# Patient Record
Sex: Female | Born: 1935
Health system: Southern US, Community
[De-identification: ages and names within clinical notes are randomized; demographics above are authoritative.]

## PROBLEM LIST (undated history)

## (undated) DIAGNOSIS — L900 Lichen sclerosus et atrophicus: Secondary | ICD-10-CM

## (undated) DIAGNOSIS — M419 Scoliosis, unspecified: Secondary | ICD-10-CM

## (undated) DIAGNOSIS — D126 Benign neoplasm of colon, unspecified: Secondary | ICD-10-CM

## (undated) DIAGNOSIS — E782 Mixed hyperlipidemia: Secondary | ICD-10-CM

## (undated) DIAGNOSIS — I10 Essential (primary) hypertension: Secondary | ICD-10-CM

## (undated) DIAGNOSIS — R413 Other amnesia: Secondary | ICD-10-CM

## (undated) DIAGNOSIS — R439 Unspecified disturbances of smell and taste: Secondary | ICD-10-CM

## (undated) DIAGNOSIS — F068 Other specified mental disorders due to known physiological condition: Secondary | ICD-10-CM

## (undated) DIAGNOSIS — K219 Gastro-esophageal reflux disease without esophagitis: Secondary | ICD-10-CM

## (undated) DIAGNOSIS — M1990 Unspecified osteoarthritis, unspecified site: Secondary | ICD-10-CM

## (undated) DIAGNOSIS — M8580 Other specified disorders of bone density and structure, unspecified site: Secondary | ICD-10-CM

## (undated) DIAGNOSIS — D170 Benign lipomatous neoplasm of skin and subcutaneous tissue of head, face and neck: Secondary | ICD-10-CM

## (undated) HISTORY — DX: Other specified mental disorders due to known physiological condition: F06.8

## (undated) HISTORY — DX: Essential (primary) hypertension: I10

## (undated) HISTORY — DX: Unspecified disturbances of smell and taste: R43.9

## (undated) HISTORY — DX: Other specified disorders of bone density and structure, unspecified site: M85.80

## (undated) HISTORY — DX: Other amnesia: R41.3

## (undated) HISTORY — DX: Lichen sclerosus et atrophicus: L90.0

## (undated) HISTORY — PX: TONSILECTOMY, ADENOIDECTOMY, BILATERAL MYRINGOTOMY AND TUBES: SHX2538

## (undated) HISTORY — DX: Benign lipomatous neoplasm of skin and subcutaneous tissue of head, face and neck: D17.0

## (undated) HISTORY — DX: Mixed hyperlipidemia: E78.2

## (undated) HISTORY — PX: CATARACT EXTRACTION, BILATERAL: SHX1313

## (undated) HISTORY — DX: Benign neoplasm of colon, unspecified: D12.6

---

## 1986-04-25 HISTORY — PX: OOPHORECTOMY: SHX86

## 1998-03-26 ENCOUNTER — Other Ambulatory Visit: Admission: RE | Admit: 1998-03-26 | Discharge: 1998-03-26 | Payer: Self-pay | Admitting: Obstetrics and Gynecology

## 1998-10-01 ENCOUNTER — Ambulatory Visit (HOSPITAL_COMMUNITY): Admission: RE | Admit: 1998-10-01 | Discharge: 1998-10-01 | Payer: Self-pay | Admitting: Obstetrics & Gynecology

## 1999-02-11 ENCOUNTER — Encounter: Payer: Self-pay | Admitting: Rheumatology

## 1999-02-11 ENCOUNTER — Encounter: Admission: RE | Admit: 1999-02-11 | Discharge: 1999-02-11 | Payer: Self-pay | Admitting: Rheumatology

## 1999-03-30 ENCOUNTER — Other Ambulatory Visit: Admission: RE | Admit: 1999-03-30 | Discharge: 1999-03-30 | Payer: Self-pay | Admitting: Obstetrics and Gynecology

## 2000-09-26 ENCOUNTER — Other Ambulatory Visit: Admission: RE | Admit: 2000-09-26 | Discharge: 2000-09-26 | Payer: Self-pay | Admitting: Obstetrics and Gynecology

## 2001-09-12 ENCOUNTER — Encounter (INDEPENDENT_AMBULATORY_CARE_PROVIDER_SITE_OTHER): Payer: Self-pay | Admitting: Specialist

## 2001-09-12 ENCOUNTER — Ambulatory Visit (HOSPITAL_COMMUNITY): Admission: RE | Admit: 2001-09-12 | Discharge: 2001-09-12 | Payer: Self-pay | Admitting: Gastroenterology

## 2002-03-04 ENCOUNTER — Other Ambulatory Visit: Admission: RE | Admit: 2002-03-04 | Discharge: 2002-03-04 | Payer: Self-pay | Admitting: Obstetrics and Gynecology

## 2002-07-23 ENCOUNTER — Ambulatory Visit (HOSPITAL_COMMUNITY): Admission: RE | Admit: 2002-07-23 | Discharge: 2002-07-23 | Payer: Self-pay | Admitting: *Deleted

## 2002-07-23 ENCOUNTER — Encounter: Payer: Self-pay | Admitting: *Deleted

## 2003-09-18 ENCOUNTER — Encounter: Admission: RE | Admit: 2003-09-18 | Discharge: 2003-12-17 | Payer: Self-pay | Admitting: Neurology

## 2003-09-19 ENCOUNTER — Ambulatory Visit (HOSPITAL_COMMUNITY): Admission: RE | Admit: 2003-09-19 | Discharge: 2003-09-19 | Payer: Self-pay | Admitting: Neurology

## 2003-10-11 ENCOUNTER — Ambulatory Visit (HOSPITAL_COMMUNITY): Admission: RE | Admit: 2003-10-11 | Discharge: 2003-10-11 | Payer: Self-pay | Admitting: Neurology

## 2004-02-09 ENCOUNTER — Encounter (INDEPENDENT_AMBULATORY_CARE_PROVIDER_SITE_OTHER): Payer: Self-pay | Admitting: Specialist

## 2004-02-09 ENCOUNTER — Ambulatory Visit (HOSPITAL_COMMUNITY): Admission: RE | Admit: 2004-02-09 | Discharge: 2004-02-09 | Payer: Self-pay | Admitting: Gastroenterology

## 2004-03-29 ENCOUNTER — Ambulatory Visit (HOSPITAL_COMMUNITY): Admission: RE | Admit: 2004-03-29 | Discharge: 2004-03-29 | Payer: Self-pay | Admitting: Internal Medicine

## 2004-04-06 ENCOUNTER — Other Ambulatory Visit: Admission: RE | Admit: 2004-04-06 | Discharge: 2004-04-06 | Payer: Self-pay | Admitting: Obstetrics and Gynecology

## 2006-03-31 ENCOUNTER — Other Ambulatory Visit: Admission: RE | Admit: 2006-03-31 | Discharge: 2006-03-31 | Payer: Self-pay | Admitting: Obstetrics and Gynecology

## 2007-02-12 ENCOUNTER — Ambulatory Visit (HOSPITAL_COMMUNITY): Admission: RE | Admit: 2007-02-12 | Discharge: 2007-02-12 | Payer: Self-pay | Admitting: Dentistry

## 2007-02-26 ENCOUNTER — Ambulatory Visit: Payer: Self-pay | Admitting: Cardiology

## 2007-02-26 LAB — CONVERTED CEMR LAB
Albumin: 4 g/dL (ref 3.5–5.2)
Alkaline Phosphatase: 30 units/L — ABNORMAL LOW (ref 39–117)
Cholesterol: 188 mg/dL (ref 0–200)
GFR calc Af Amer: 157 mL/min
GFR calc non Af Amer: 130 mL/min
Total Bilirubin: 0.8 mg/dL (ref 0.3–1.2)
Total CHOL/HDL Ratio: 3.1
Triglycerides: 99 mg/dL (ref 0–149)
VLDL: 20 mg/dL (ref 0–40)

## 2007-03-06 ENCOUNTER — Ambulatory Visit: Payer: Self-pay

## 2007-04-12 ENCOUNTER — Encounter: Admission: RE | Admit: 2007-04-12 | Discharge: 2007-04-12 | Payer: Self-pay | Admitting: Orthopedic Surgery

## 2007-10-16 ENCOUNTER — Ambulatory Visit: Payer: Self-pay | Admitting: Cardiology

## 2007-10-16 LAB — CONVERTED CEMR LAB
ALT: 26 units/L (ref 0–35)
BUN: 26 mg/dL — ABNORMAL HIGH (ref 6–23)
Bilirubin, Direct: 0.1 mg/dL (ref 0.0–0.3)
CO2: 28 meq/L (ref 19–32)
Chloride: 107 meq/L (ref 96–112)
GFR calc Af Amer: 106 mL/min
GFR calc non Af Amer: 88 mL/min
Glucose, Bld: 110 mg/dL — ABNORMAL HIGH (ref 70–99)
LDL Cholesterol: 93 mg/dL (ref 0–99)
Potassium: 3.6 meq/L (ref 3.5–5.1)
Sodium: 142 meq/L (ref 135–145)
Total Bilirubin: 0.8 mg/dL (ref 0.3–1.2)

## 2008-01-25 ENCOUNTER — Ambulatory Visit: Payer: Self-pay | Admitting: Obstetrics and Gynecology

## 2008-04-09 ENCOUNTER — Ambulatory Visit: Payer: Self-pay | Admitting: Obstetrics and Gynecology

## 2008-04-09 ENCOUNTER — Other Ambulatory Visit: Admission: RE | Admit: 2008-04-09 | Discharge: 2008-04-09 | Payer: Self-pay | Admitting: Obstetrics and Gynecology

## 2008-04-09 ENCOUNTER — Encounter: Payer: Self-pay | Admitting: Obstetrics and Gynecology

## 2008-05-22 ENCOUNTER — Ambulatory Visit: Payer: Self-pay | Admitting: Obstetrics and Gynecology

## 2008-07-19 DIAGNOSIS — R0989 Other specified symptoms and signs involving the circulatory and respiratory systems: Secondary | ICD-10-CM

## 2008-07-19 DIAGNOSIS — E785 Hyperlipidemia, unspecified: Secondary | ICD-10-CM | POA: Insufficient documentation

## 2008-07-19 DIAGNOSIS — I10 Essential (primary) hypertension: Secondary | ICD-10-CM | POA: Insufficient documentation

## 2008-09-25 ENCOUNTER — Ambulatory Visit: Payer: Self-pay | Admitting: Cardiology

## 2008-09-26 LAB — CONVERTED CEMR LAB
ALT: 25 units/L (ref 0–35)
Albumin: 4 g/dL (ref 3.5–5.2)
BUN: 22 mg/dL (ref 6–23)
Calcium: 9.5 mg/dL (ref 8.4–10.5)
Chloride: 103 meq/L (ref 96–112)
Cholesterol: 210 mg/dL — ABNORMAL HIGH (ref 0–200)
GFR calc non Af Amer: 74.9 mL/min (ref >60)
Glucose, Bld: 90 mg/dL (ref 70–99)
Potassium: 3.9 meq/L (ref 3.5–5.1)
Total Protein: 6.4 g/dL (ref 6.0–8.3)

## 2008-11-10 ENCOUNTER — Telehealth: Payer: Self-pay | Admitting: Cardiology

## 2009-01-27 ENCOUNTER — Telehealth: Payer: Self-pay | Admitting: Cardiology

## 2009-03-19 ENCOUNTER — Encounter: Payer: Self-pay | Admitting: Cardiology

## 2009-03-24 ENCOUNTER — Ambulatory Visit: Payer: Self-pay | Admitting: Obstetrics and Gynecology

## 2009-03-31 ENCOUNTER — Telehealth (INDEPENDENT_AMBULATORY_CARE_PROVIDER_SITE_OTHER): Payer: Self-pay | Admitting: *Deleted

## 2009-05-13 ENCOUNTER — Ambulatory Visit: Payer: Self-pay | Admitting: Obstetrics and Gynecology

## 2009-05-15 ENCOUNTER — Telehealth: Payer: Self-pay | Admitting: Cardiology

## 2009-05-19 ENCOUNTER — Ambulatory Visit: Payer: Self-pay | Admitting: Cardiology

## 2009-05-22 LAB — CONVERTED CEMR LAB
Albumin: 3.9 g/dL (ref 3.5–5.2)
Alkaline Phosphatase: 35 units/L — ABNORMAL LOW (ref 39–117)
Direct LDL: 231.5 mg/dL
HDL: 73.4 mg/dL (ref >39.00)
Total CHOL/HDL Ratio: 4
Triglycerides: 129 mg/dL (ref 0.0–149.0)
VLDL: 25.8 mg/dL (ref 0.0–40.0)

## 2009-06-23 ENCOUNTER — Ambulatory Visit: Payer: Self-pay | Admitting: Cardiology

## 2009-06-24 ENCOUNTER — Encounter (INDEPENDENT_AMBULATORY_CARE_PROVIDER_SITE_OTHER): Payer: Self-pay | Admitting: *Deleted

## 2009-06-24 LAB — CONVERTED CEMR LAB
Cholesterol: 181 mg/dL (ref 0–200)
HDL: 81.5 mg/dL (ref >39.00)
Total Bilirubin: 0.2 mg/dL — ABNORMAL LOW (ref 0.3–1.2)
Total CHOL/HDL Ratio: 2
VLDL: 14.6 mg/dL (ref 0.0–40.0)

## 2009-06-25 ENCOUNTER — Encounter: Admission: RE | Admit: 2009-06-25 | Discharge: 2009-06-25 | Payer: Self-pay | Admitting: Orthopedic Surgery

## 2009-07-13 ENCOUNTER — Telehealth: Payer: Self-pay | Admitting: Cardiology

## 2009-09-17 ENCOUNTER — Ambulatory Visit: Payer: Self-pay | Admitting: Cardiology

## 2009-09-18 LAB — CONVERTED CEMR LAB
Calcium: 9.7 mg/dL (ref 8.4–10.5)
Creatinine, Ser: 0.6 mg/dL (ref 0.4–1.2)
Glucose, Bld: 74 mg/dL (ref 70–99)

## 2009-11-02 ENCOUNTER — Telehealth: Payer: Self-pay | Admitting: Cardiology

## 2010-03-09 ENCOUNTER — Telehealth: Payer: Self-pay | Admitting: Cardiology

## 2010-03-16 ENCOUNTER — Telehealth: Payer: Self-pay | Admitting: Cardiology

## 2010-03-24 ENCOUNTER — Telehealth: Payer: Self-pay | Admitting: Cardiology

## 2010-03-29 ENCOUNTER — Telehealth: Payer: Self-pay | Admitting: Cardiology

## 2010-04-14 ENCOUNTER — Telehealth: Payer: Self-pay | Admitting: Cardiology

## 2010-04-28 ENCOUNTER — Ambulatory Visit: Admit: 2010-04-28 | Payer: Self-pay | Admitting: Cardiology

## 2010-05-17 ENCOUNTER — Other Ambulatory Visit
Admission: RE | Admit: 2010-05-17 | Discharge: 2010-05-17 | Payer: Self-pay | Source: Home / Self Care | Admitting: Obstetrics and Gynecology

## 2010-05-17 ENCOUNTER — Ambulatory Visit
Admission: RE | Admit: 2010-05-17 | Discharge: 2010-05-17 | Payer: Self-pay | Source: Home / Self Care | Attending: Gynecology | Admitting: Gynecology

## 2010-05-17 ENCOUNTER — Other Ambulatory Visit: Payer: Self-pay | Admitting: Obstetrics and Gynecology

## 2010-05-25 NOTE — Progress Notes (Signed)
Summary: t having a grinding sound coming from jaw since starting new med  Phone Note Call from Patient   Caller: Patient [phone] Reason for Call: Talk to Nurse Summary of Call: pt was switched to a new med due to side effects, only took med a few days and has a grinding sound coming from her jaw-pls advise-(also wanted it noted that she was given a 30 day supply and now has all this med if she's to stop taking, although she's says no one had any idea this would happen) Initial call taken by: Glynda Jaeger,  March 24, 2010 12:45 PM  Follow-up for Phone Call        spoke with pt, she reports a grinding sound on the right side of jaw when she chews. this has happened since she started the pravachol. she will stop the pravachol and let me know if her symptoms improve Deliah Goody, RN  March 24, 2010 5:47 PM

## 2010-05-25 NOTE — Letter (Signed)
Summary: Custom - Lipid  [facility], Main Office  [address]   [place], [place] [address]   Phone: [phone]  Fax: [phone]     June 24, 2009 MRN: [id number]   [facility] [address] [place], [place]  [address]   Dear Ms. Margaret [name],  We have reviewed your cholesterol results.  They are as follows:     Total Cholesterol:    181 (Desirable: less than 200)       HDL  Cholesterol:     81.50  (Desirable: greater than 40 for men and 50 for women)       LDL Cholesterol:       85  (Desirable: less than 100 for low risk and less than 70 for moderate to high risk)       Triglycerides:       73.0  (Desirable: less than 150)  Our recommendations include:   Call our office at the number listed above if you have any questions.  Lowering your LDL cholesterol is important, but it is only one of a large number of "risk factors" that may indicate that you are at risk for heart disease, stroke or other complications of hardening of the arteries.  Other risk factors include:   A.  Cigarette Smoking* B.  High Blood Pressure* C.  Obesity* D.   Low HDL Cholesterol (see yours above)* E.   Diabetes Mellitus (higher risk if your is uncontrolled) F.  Family history of premature heart disease G.  Previous history of stroke or cardiovascular disease    *These are risk factors YOU HAVE CONTROL OVER.  For more information, visit .  There is now evidence that lowering the TOTAL CHOLESTEROL AND LDL CHOLESTEROL can reduce the risk of heart disease.  The American Heart Association recommends the following guidelines for the treatment of elevated cholesterol:  1.  If there is now current heart disease and less than two risk factors, TOTAL CHOLESTEROL should be less than 200 and LDL CHOLESTEROL should be less than 100. 2.  If there is current heart disease or two or more risk factors, TOTAL CHOLESTEROL should be less than 200 and LDL CHOLESTEROL should be less than  70.  A diet low in cholesterol, saturated fat, and calories is the cornerstone of treatment for elevated cholesterol.  Cessation of smoking and exercise are also important in the management of elevated cholesterol and preventing vascular disease.  Studies have shown that 30 to 60 minutes of physical activity most days can help lower blood pressure, lower cholesterol, and keep your weight at a healthy level.  Drug therapy is used when cholesterol levels do not respond to therapeutic lifestyle changes (smoking cessation, diet, and exercise) and remains unacceptably high.  If medication is started, it is important to have you levels checked periodically to evaluate the need for further treatment options.  Thank you,    [employer] Team

## 2010-05-25 NOTE — Progress Notes (Signed)
Summary: refill request  Phone Note Call from Patient   Caller: Patient Reason for Call: Talk to Nurse Summary of Call: pt needs refill  of diavan #90 at [facility] in [place] [phone] wants a call when called in, said the pharmacy sent a fax request last week and they hadn't heard anything-so wants to make sure it get's there-pls call [phone] Initial call taken by: Glynda Jaeger,  November 02, 2009 2:23 PM  Follow-up for Phone Call        Called medication in to the pharmacy gave pt #90 and 3 refills.Marland KitchenMarland KitchenMarland KitchenCalled pt and let her know i sent in the med Follow-up by: Kem Parkinson,  November 02, 2009 2:53 PM

## 2010-05-25 NOTE — Progress Notes (Signed)
Summary: QUESTION RE MEDS  Phone Note Call from Patient Call back at Home Phone [phone]   Caller: Patient Reason for Call: Talk to Nurse Summary of Call: PT IS HAVING JAW PAIN PT WOULD LIKE TO KNOW IS IT CAUSE BY MEDS. IF SO CAN THE MEDS BE CHANGED. Initial call taken by: Roe Coombs,  March 09, 2010 10:59 AM  Follow-up for Phone Call        spoke with pt, she has noticed a discomfort in her jaw. the discomfort is usually there all the time. nothing makes it worse and tylenol or asa eases the discomfort. she denies other chest pain or SOB. she had simular symptoms some time ago and it was being caused by a medication. she questioned if could be from crestor or diovan. explained to pt usually crestor pain is general not local. she was given the okay to stop the crestor for the next week and see if that helps with the discomfort. she will call and let me know Deliah Goody, RN  March 09, 2010 11:58 AM

## 2010-05-25 NOTE — Progress Notes (Signed)
Summary: refill**New Pharmacy**  Phone Note Refill Request Message from:  Patient on July 13, 2009 8:23 AM  Refills Requested: Medication #1:  CRESTOR 20 MG TABS Take one tablet by mouth daily.   Supply Requested: 6 months **[facility] [address]**   Method Requested: Fax to [facility] Initial call taken by: Migdalia Dk,  July 13, 2009 8:24 AM Caller: Patient Reason for Call: Talk to Nurse    Prescriptions: CRESTOR 20 MG TABS (ROSUVASTATIN CALCIUM) Take one tablet by mouth daily.  #30 x 12   Entered by:   Kem Parkinson   Authorized by:   Ferman Hamming, MD, [facility]   Signed by:   Kem Parkinson on 07/13/2009   Method used:   Electronically to        [facility] [phone]* (retail)       [address] [place], [place]  [address]       Ph: [phone]       Fax: [phone]   RxID:   [id number]

## 2010-05-25 NOTE — Progress Notes (Signed)
Summary: QUESTION ABOUT LIPITOR  Phone Note Call from Patient Call back at Home Phone [phone]   Caller: Patient Summary of Call: PT CALLING REGARDING HER LIPITOR Initial call taken by: Judie Grieve,  May 15, 2009 9:31 AM  Follow-up for Phone Call        spoke with pt, she has been off lipitor for several months now and the symptom of no taste has gone away. she questioned if she needed to take a different cholestrol med. will foward to dr Jens Som for review Deliah Goody, RN  May 15, 2009 3:00 PM   Additional Follow-up for Phone Call Additional follow up Details #1::        check lipids Ferman Hamming, MD, [facility]  May 15, 2009 3:02 PM  spoke with pt, she will come on wednesday next week for fasting labs Deliah Goody, RN  May 15, 2009 4:54 PM

## 2010-05-25 NOTE — Progress Notes (Signed)
Summary: questions about medication  Phone Note Call from Patient Call back at Home Phone [phone]   Caller: Patient Summary of Call: Pt calling with questions about medications Initial call taken by: Judie Grieve,  March 29, 2010 8:51 AM  Follow-up for Phone Call        Margaret [name] calls today to report grinding and jaw discomfort are better since discontinuing Pravastatin on 11/29.  She would like to know what else Dr. Jens Som recommends?  Margaret Red RN     Appended Document: questions about medication try lipitor 20 mg by mouth daily; lipids and liver in six weeks  Appended Document: questions about medication LMTCB./CY

## 2010-05-25 NOTE — Progress Notes (Signed)
Summary: pt rtn with update  Phone Note Call from Patient Call back at Home Phone [phone]   Caller: Patient Reason for Call: Talk to Nurse, Talk to Doctor Summary of Call: since pt has been off the meds her jaw is no longer hurting the only thing is when she opens her mouth wide it cracks but she dosen't do that often Initial call taken by: Omer Jack,  March 16, 2010 9:09 AM  Follow-up for Phone Call        spoke with pt, since she has stopped the crestor the pain in her jaw has gone away. she has only taken lipitor other than crestor for her cholestrol. will foward for dr Jens Som for his review Deliah Goody, RN  March 16, 2010 1:57 PM   Additional Follow-up for Phone Call Additional follow up Details #1::        try pravachol 40 mg by mouth daily; lipids and liver in six weeks Ferman Hamming, MD, [facility]  March 16, 2010 3:06 PM  pt aware of med change and need for repeat labs in 6 weeks Deliah Goody, RN  March 17, 2010 2:04 PM     New/Updated Medications: PRAVASTATIN SODIUM 40 MG TABS (PRAVASTATIN SODIUM) Take one tablet by mouth daily at bedtime Prescriptions: PRAVASTATIN SODIUM 40 MG TABS (PRAVASTATIN SODIUM) Take one tablet by mouth daily at bedtime  #30 x 12   Entered by:   Deliah Goody, RN   Authorized by:   Ferman Hamming, MD, [facility]   Signed by:   Deliah Goody, RN on 03/17/2010   Method used:   Electronically to        [facility] [phone]* (retail)       [address]       [address]       [place], [place]  [address]       Ph: [phone]       Fax: [phone]   RxID:   [id number]

## 2010-05-25 NOTE — Letter (Signed)
Summary: [facility]  [facility]   Imported By: Kassie Mends 05/12/2009 08:24:02  _____________________________________________________________________  External Attachment:    Type:   Image     Comment:   External Document

## 2010-05-25 NOTE — Assessment & Plan Note (Signed)
Summary: yearly/sl   History of Present Illness: Margaret [name] is a very pleasant  female who has hypertension and hyperlipidemia. An echocardiogram performed in November of 2008 showed normal LV function. There was trivial aortic insufficiency. LDL in March 2011 was 85.  I last saw her in June of 2010. Since then she denies any dyspnea on exertion, orthopnea, PND, pedal edema, palpitations, syncope or exertional chest pain.  Current Medications (verified): 1)  Crestor 20 Mg Tabs (Rosuvastatin Calcium) .... Take One Tablet By Mouth Daily. 2)  Calcium 1200mg  .... 1 Tab By Mouth Once Daily 3)  Vitamin D 1000 Unit Caps (Cholecalciferol) .Marland Kitchen.. 1 Tab By Mouth Once Daily 4)  Diovan 160 Mg Tabs (Valsartan) .Marland Kitchen.. 1 Tab By Mouth Once Daily 5)  Aspirin 81 Mg Tbec (Aspirin) .... 2 Tab By Mouth Once Daily  Past History:  Past Medical History: HYPERTENSION, UNSPECIFIED (ICD-401.9) HYPERLIPIDEMIA-MIXED (ICD-272.4)    Past Surgical History: Reviewed history from 07/19/2008 and no changes required. Tonsilectomy  oophorectomy - 1988  Social History: Reviewed history from 07/19/2008 and no changes required. Full Time Married  Tobacco Use - Former.  Alcohol Use - yes Regular Exercise - yes Drug Use - no  Review of Systems       no fevers or chills, productive cough, hemoptysis, dysphasia, odynophagia, melena, hematochezia, dysuria, hematuria, rash, seizure activity, orthopnea, PND, pedal edema, claudication. Remaining systems are negative.   Vital Signs:  Patient profile:   76 year old female Height:      61 inches Weight:      114 pounds BMI:     21.62 Pulse rate:   70 / minute Resp:     12 per minute BP sitting:   121 / 61  (left arm)  Vitals Entered By: Margaret [name] (Sep 17, 2009 2:15 PM)  Physical Exam  General:  Well-developed well-nourished in no acute distress.  Skin is warm and dry.  HEENT is normal.  Neck is supple. No thyromegaly.  Chest is clear to auscultation with  normal expansion.  Cardiovascular exam is regular rate and rhythm.  Abdominal exam nontender or distended. No masses palpated. Extremities show no edema. neuro grossly intact    EKG  Procedure date:  09/17/2009  Findings:      Normal sinus rhythm at a rate of 72. Axis normal. No ST changes.  Impression & Recommendations:  Problem # 1:  HYPERTENSION, UNSPECIFIED (ICD-401.9) Blood pressure controlled on present medications. Will continue. Check Bmet. Her updated medication list for this problem includes:    Diovan 160 Mg Tabs (Valsartan) .Marland Kitchen... 1 tab by mouth once daily    Aspirin 81 Mg Tbec (Aspirin) .Marland Kitchen... 2 tab by mouth once daily  Orders: TLB-BMP (Basic Metabolic Panel-BMET) (80048-METABOL)  Problem # 2:  HYPERLIPIDEMIA-MIXED (ICD-272.4) Continue statin. Recent lipids and liver outstanding. Her updated medication list for this problem includes:    Crestor 20 Mg Tabs (Rosuvastatin calcium) .Marland Kitchen... Take one tablet by mouth daily.  Patient Instructions: 1)  Your physician recommends that you schedule a follow-up appointment in: one year

## 2010-05-27 NOTE — Progress Notes (Signed)
Summary: pt  has med question  Phone Note Call from Patient   Caller: Patient [phone] Reason for Call: Talk to Nurse Summary of Call: pt told to stop taking cholesterol med because of jaw pain-jaw pain was solved by visit to dentist-should should go back on now?  Initial call taken by: Glynda Jaeger,  April 14, 2010 10:15 AM  Follow-up for Phone Call        per note from 03/30/2010 pt was to have started Lipitor however pt states she didn't get that information.  Regardless, she has been on Lipitor before and lost her ability to taste and also had had some jaw pain.  She would like to know if she should restart Lipitor knowing this and if she should merely attempt to restart Pravastatin as she had only taken a few of those tablets.  Pt aware I will send this information and her concerns to Dr Jens Som for his review.  She will be contacted for further instructions once a decision has been made. Follow-up by: Charolotte Capuchin, RN,  April 14, 2010 10:45 AM  Additional Follow-up for Phone Call Additional follow up Details #1::        no lipitor; pravachol 40 mg by mouth daily; lipids and liver in six weeks. Ferman Hamming, MD, [facility]  April 14, 2010 10:50 AM      Appended Document: pt  has med question lmtcb./cy  Appended Document: pt  has med question lmtcb ./cy    Appended Document: pt  has med question PT AWARE./CY

## 2010-06-08 ENCOUNTER — Other Ambulatory Visit (INDEPENDENT_AMBULATORY_CARE_PROVIDER_SITE_OTHER): Payer: Medicare Other

## 2010-06-08 ENCOUNTER — Encounter: Payer: Self-pay | Admitting: Cardiology

## 2010-06-08 ENCOUNTER — Other Ambulatory Visit: Payer: Self-pay | Admitting: Cardiology

## 2010-06-08 DIAGNOSIS — E785 Hyperlipidemia, unspecified: Secondary | ICD-10-CM

## 2010-06-08 LAB — HEPATIC FUNCTION PANEL
AST: 21 U/L (ref 0–37)
Albumin: 4 g/dL (ref 3.5–5.2)
Alkaline Phosphatase: 42 U/L (ref 39–117)
Bilirubin, Direct: 0.1 mg/dL (ref 0.0–0.3)
Total Bilirubin: 0.9 mg/dL (ref 0.3–1.2)
Total Protein: 6.2 g/dL (ref 6.0–8.3)

## 2010-06-08 LAB — LIPID PANEL
Triglycerides: 77 mg/dL (ref 0.0–149.0)
VLDL: 15.4 mg/dL (ref 0.0–40.0)

## 2010-06-08 LAB — LDL CHOLESTEROL, DIRECT: Direct LDL: 164.1 mg/dL

## 2010-06-28 ENCOUNTER — Telehealth: Payer: Self-pay | Admitting: Cardiology

## 2010-06-30 DIAGNOSIS — M79609 Pain in unspecified limb: Secondary | ICD-10-CM | POA: Insufficient documentation

## 2010-07-02 ENCOUNTER — Telehealth (INDEPENDENT_AMBULATORY_CARE_PROVIDER_SITE_OTHER): Payer: Self-pay | Admitting: *Deleted

## 2010-07-02 DIAGNOSIS — Z0289 Encounter for other administrative examinations: Secondary | ICD-10-CM

## 2010-07-06 NOTE — Progress Notes (Addendum)
Summary: pt needs neuro referral  Phone Note Call from Patient   Caller: Patient [phone] Reason for Call: Talk to Nurse Summary of Call: pt needs referral to a neurologist for left arm problem, feels "weird" dr friends have assured her it's not heart related but needs a neurological eval-if we can call the referral, she'll make the appt Initial call taken by: Glynda Jaeger,  June 28, 2010 10:59 AM  Follow-up for Phone Call        spoke with pt, left arm is tingling. if she lies her arm straight with no movement the arm tingles. she denies chest pain or SOB.  she feels she needs to see a neurologist for an eval. request a referral, will foward for dr Jens Som review Deliah Goody, RN  June 28, 2010 11:07 AM  Patient does not have a PCP. She does not want to see a PCP regarding this and would like the referral to see a neurologist. I advised her that a PCP may be able to run more test &  this isn't necessarily something she must see neurology about but she insist on seeing one. Whitney Maeola Sarah RN  June 28, 2010 5:44 PM  Follow-up by: Whitney Maeola Sarah RN,  June 28, 2010 5:44 PM  Additional Follow-up for Phone Call Additional follow up Details #1::        she should see her primary care first Ferman Hamming, MD, [facility]  June 28, 2010 1:09 PM     New Problems: ARM PAIN (ICD-729.5)   Additional Follow-up for Phone Call Additional follow up Details #2::    refer neurology, pt needs primary care physician Ferman Hamming, MD, [facility]  June 29, 2010 7:07 AM  pt aware Deliah Goody, RN  June 30, 2010 6:01 PM   New Problems: ARM PAIN (ICD-729.5)  Appended Document: pt needs neuro referral pt aware the neuro office will not see her until she sees her primary or ortho first.

## 2010-07-06 NOTE — Progress Notes (Signed)
  Request Received from [facility] Copy Master sent to [facility]  July 02, 2010 1:46 PM

## 2010-07-29 ENCOUNTER — Telehealth: Payer: Self-pay | Admitting: Cardiology

## 2010-08-24 ENCOUNTER — Encounter: Payer: Self-pay | Admitting: Cardiology

## 2010-08-25 ENCOUNTER — Ambulatory Visit: Payer: BC Managed Care – HMO | Admitting: Cardiology

## 2010-08-27 ENCOUNTER — Telehealth: Payer: Self-pay | Admitting: Cardiology

## 2010-08-27 ENCOUNTER — Other Ambulatory Visit: Payer: Self-pay | Admitting: *Deleted

## 2010-08-27 ENCOUNTER — Encounter: Payer: Self-pay | Admitting: Cardiology

## 2010-08-27 ENCOUNTER — Ambulatory Visit (INDEPENDENT_AMBULATORY_CARE_PROVIDER_SITE_OTHER): Payer: Medicare Other | Admitting: Cardiology

## 2010-08-27 DIAGNOSIS — E785 Hyperlipidemia, unspecified: Secondary | ICD-10-CM

## 2010-08-27 DIAGNOSIS — I10 Essential (primary) hypertension: Secondary | ICD-10-CM

## 2010-08-27 DIAGNOSIS — E7800 Pure hypercholesterolemia, unspecified: Secondary | ICD-10-CM

## 2010-08-27 MED ORDER — ROSUVASTATIN CALCIUM 10 MG PO TABS: 10.0000 mg | ORAL_TABLET | Freq: Every day | ORAL | Status: DC

## 2010-08-27 MED ORDER — VALSARTAN 320 MG PO TABS: 320.0000 mg | ORAL_TABLET | Freq: Every day | ORAL | Status: DC

## 2010-08-27 NOTE — Telephone Encounter (Signed)
Pt calling re med list-has question re two meds

## 2010-08-27 NOTE — Telephone Encounter (Signed)
Pt questions answered Margaret [name]

## 2010-08-27 NOTE — Progress Notes (Signed)
HPI: Ms. Essman is a very pleasant  female who has hypertension and hyperlipidemia. An echocardiogram performed in November of 2008 showed normal LV function. There was trivial aortic insufficiency. I last saw her in May of 2011. Since then she denies any dyspnea on exertion, orthopnea, PND, pedal edema, palpitations, syncope or exertional chest pain.   Current Outpatient Prescriptions  Medication Sig Dispense Refill  . acetaminophen (TYLENOL) 650 MG CR tablet Take 650 mg by mouth every 8 (eight) hours as needed.        Marland Kitchen aspirin 81 MG tablet 81 mg. Pt only taking when she remembers.      . Calcium Carbonate-Vit D-Min (CALCIUM 1200 PO) Take 1 tablet by mouth daily.        . Cholecalciferol (VITAMIN D) 1000 UNITS capsule Take 1,000 Units by mouth daily.        . pravastatin (PRAVACHOL) 40 MG tablet Take 40 mg by mouth at bedtime.        . valsartan (DIOVAN) 160 MG tablet Take 160 mg by mouth daily.           Past Medical History  Diagnosis Date  . HTN (hypertension)     unspecified  . Hyperlipidemia     mixed    Past Surgical History  Procedure Date  . Tonsilectomy, adenoidectomy, bilateral myringotomy and tubes   . Oophorectomy 1988    History   Social History  . Marital Status: Married    Spouse Name: N/A    Number of Children: N/A  . Years of Education: N/A   Occupational History  .      full time   Social History Main Topics  . Smoking status: Former Games developer  . Smokeless tobacco: Not on file  . Alcohol Use: Yes  . Drug Use: No  . Sexually Active: Not on file   Other Topics Concern  . Not on file   Social History Narrative  . No narrative on file    ROS: no fevers or chills, productive cough, hemoptysis, dysphasia, odynophagia, melena, hematochezia, dysuria, hematuria, rash, seizure activity, orthopnea, PND, pedal edema, claudication. Remaining systems are negative.  Physical Exam: Well-developed well-nourished in no acute distress.  Skin is warm and dry.    HEENT is normal.  Neck is supple. No thyromegaly.  Chest is clear to auscultation with normal expansion.  Cardiovascular exam is regular rate and rhythm.  Abdominal exam nontender or distended. No masses palpated. Extremities show no edema. neuro grossly intact  ECG NSR, CRO prior [facility]

## 2010-08-27 NOTE — Assessment & Plan Note (Signed)
Recent LDL elevated. Discontinue Pravachol. Begin Crestor 20 mg daily. Check lipids and liver in 4 weeks.

## 2010-08-27 NOTE — Patient Instructions (Signed)
Your physician recommends that you schedule a follow-up appointment in: ONE YEAR  STOP PRAVASTATIN  START CRESTOR 20 MG ONCE DAILY  INCREASE DIOVAN TO 320 MG ONCE DAILY  Your physician recommends that you return for lab work in: 6 WEEKS

## 2010-08-27 NOTE — Assessment & Plan Note (Signed)
Blood pressure elevated. Increase Diovan to 320 mg daily. Check potassium and renal function 4 weeks.

## 2010-09-01 ENCOUNTER — Other Ambulatory Visit: Payer: Self-pay | Admitting: Neurology

## 2010-09-01 DIAGNOSIS — F29 Unspecified psychosis not due to a substance or known physiological condition: Secondary | ICD-10-CM

## 2010-09-01 DIAGNOSIS — E785 Hyperlipidemia, unspecified: Secondary | ICD-10-CM

## 2010-09-01 DIAGNOSIS — R439 Unspecified disturbances of smell and taste: Secondary | ICD-10-CM

## 2010-09-01 DIAGNOSIS — I10 Essential (primary) hypertension: Secondary | ICD-10-CM

## 2010-09-03 ENCOUNTER — Ambulatory Visit
Admission: RE | Admit: 2010-09-03 | Discharge: 2010-09-03 | Disposition: A | Discharge status: Home / Self Care | Payer: Medicare Other | Source: Ambulatory Visit | Attending: Neurology | Admitting: Neurology

## 2010-09-03 DIAGNOSIS — R439 Unspecified disturbances of smell and taste: Secondary | ICD-10-CM

## 2010-09-03 DIAGNOSIS — E785 Hyperlipidemia, unspecified: Secondary | ICD-10-CM

## 2010-09-03 DIAGNOSIS — F29 Unspecified psychosis not due to a substance or known physiological condition: Secondary | ICD-10-CM

## 2010-09-03 DIAGNOSIS — I10 Essential (primary) hypertension: Secondary | ICD-10-CM

## 2010-09-07 NOTE — Assessment & Plan Note (Signed)
[facility]                            CARDIOLOGY OFFICE NOTE   Margaret [name], Margaret [name]                   MRN:          [id number]  DATE:02/26/2007                            DOB:          [birth year]    HISTORY:  Margaret [name] is an extremely pleasant 76 year old female with  a past medical history of hypertension and hyperlipidemia, who wishes to  establish.  Note that she has received her previous care at [facility]  by Dr. Anice Paganini.  She has been on medications for her blood  pressure and it has been adequately controlled; however, she would  prefer to be followed in [place] and presents for new patient  establishment.  Note that she does not have dyspnea on exertion,  orthopnea, PND, pedal edema, palpitations, pre-syncope, syncope or  exertional chest pain.  There is no prior cardiac history.   CURRENT MEDICATIONS:  1. Fosamax 70 mg weekly.  2. Lipitor 40 mg p.o. daily.  3. Aspirin 81 mg p.o. daily.  4. Calcium 1200 mg p.o. daily.  5. Vitamin D 1000 mg p.o. daily.  6. Diovan 160 mg p.o. daily.   ALLERGIES:  SULFA.   SOCIAL HISTORY:  She does not smoke.  She does occasionally consume  alcohol.   FAMILY HISTORY:  Negative for coronary artery disease.  She has a family  history of ovarian cancer.   PAST MEDICAL HISTORY:  1. Significant for hypertension.  2. Hyperlipidemia.  There is no diabetes mellitus.   PAST SURGICAL HISTORY:  1. She has had previous bilateral ovary removal.  2. She has had her tonsils taken out previously.   REVIEW OF SYSTEMS:  She denies any headaches, fevers or chills.  There  is no productive cough or hemoptysis.  There is no dysphagia,  odynophagia, melena or hematochezia.  There is no dysuria or hematuria.  There is no seizure activity.  There is no orthopnea, PND or pedal  edema.  There is no claudication noted.  The remainder of the review of  systems is negative.   PHYSICAL EXAMINATION:   VITAL SIGNS:  Blood pressure 118/70, pulse 74.  She weighs 108 pounds.  GENERAL:  She is well-developed and well-nourished, in no acute  distress.  She is not depressed.  SKIN:  Warm and dry.  EXTREMITIES:  There is no peripheral clubbing.  There is no edema that I  can palpate. No cords.  She has 2+ dorsalis pedis pulses bilaterally.  BACK:  Normal.  HEENT:  Normal with normal eye lids.  NECK:  Supple with a normal upstroke bilaterally, no bruits noted.  No  jugular venous distention.  No thyromegaly is noted.  CHEST:  Clear to auscultation.  Normal on expansion.  CARDIOVASCULAR:  A regular rate and rhythm.  Normal S1 and S2.  No rubs  or gallops noted.  She does have a 2/6 systolic ejection murmur at the  left sternal border and a 2/6 systolic murmur at the apex.  ABDOMEN:  Nontender.  Non-distended.  Positive bowel sounds.  No  hepatosplenomegaly and no masses appreciated.  No abdominal bruit.  She  has 2+ femoral pulses bilaterally.  No bruits.  NEUROLOGIC:  Grossly intact.   Her electrocardiogram shows a normal sinus rhythm at a rate of 74.  There are minor non-specific inferior ST changes.   DIAGNOSES/PLAN:  1. Hypertension:  Her blood pressure is adequately controlled on the      Diovan.  We will check a BMET to follow the potassium and renal      function.  2. Hyperlipidemia:  We will check lipids and liver and adjust her      regimen as indicted.  3. She has a systolic murmur on examination that sounds to be an      ejection murmur, as well as a soft mitral regurgitation murmur:  We      will also check an echocardiogram.  This will also help [place] to      evaluate the left ventricular wall thickness.   FOLLOWUP:  The patient will follow up with me in nine months.     Madolyn Frieze Jens Som, MD, [facility]  Electronically Signed    BSC/MedQ  DD: 02/26/2007  DT: 02/27/2007  Job #: [id number]

## 2010-09-07 NOTE — Assessment & Plan Note (Signed)
[facility]                            CARDIOLOGY OFFICE NOTE   TIFFONY, KITE                   MRN:          [id number]  DATE:10/16/2007                            DOB:          1935-03-24    Ms. Fischel is a very pleasant 76 year old female who has hypertension  and hyperlipidemia.  Since I last saw her, she is doing well from a  symptomatic standpoint with no dyspnea, chest pain, palpitations, or  syncope.  There is no pedal edema.  She did recently lose her husband  from skin cancer.   MEDICATIONS:  1. Fosamax 70 mg weekly.  2. Lipitor 40 mg p.o. daily.  3. Aspirin 81 mg p.o. daily.  4. Calcium and vitamin D.  5. Diovan 160 mg p.o. daily.   PHYSICAL EXAMINATION:  VITAL SIGNS:  Today, blood pressure of 127/67 and  her pulse is 72.  She weighs 106 pounds.  HEENT:  Normal.  NECK:  Supple with no bruits.  CHEST:  Clear.  CARDIOVASCULAR:  Regular rate and rhythm.  There is a soft 1/6 systolic  ejection murmur.  ABDOMEN:  No tenderness and no pulsatile masses.  There is a  midabdominal bruit.  EXTREMITIES:  No edema.   Her electrocardiogram shows a sinus rhythm at a rate of 71.  There are  nonspecific ST changes.   DIAGNOSES:  1. Hypertension - her blood pressure is adequately controlled on her      present medications.  2. Hyperlipidemia - we will check lipids and liver today and adjust as      indicated.  She will continue on Lipitor.  Note, I will also check      a BMET given her Diovan use.  3. Midabdominal bruit - she will have an abdominal ultrasound to      exclude aneurysm as she does have remote history of tobacco abuse.   We will see her back in 12 months.  She will continue with risk factor  modification.     Madolyn Frieze Jens Som, MD, [facility]  Electronically Signed    BSC/MedQ  DD: 10/16/2007  DT: 10/16/2007  Job #: [id number]

## 2010-09-08 ENCOUNTER — Telehealth: Payer: Self-pay | Admitting: Cardiology

## 2010-09-08 DIAGNOSIS — E785 Hyperlipidemia, unspecified: Secondary | ICD-10-CM

## 2010-09-08 MED ORDER — ROSUVASTATIN CALCIUM 20 MG PO TABS: 20.0000 mg | ORAL_TABLET | Freq: Every day | ORAL | Status: DC

## 2010-09-08 NOTE — Telephone Encounter (Signed)
Pt calling re dosage change on her crestor, she picked up refill and was 10 mg her med list shows 10 mg but she has been taking 20 mg since last year, and wants to know if she has been taking the wrong dosage for the past year or was the refill called in incorrectly? pls advise

## 2010-09-08 NOTE — Telephone Encounter (Signed)
Patient should be taking 20 mg of Crestor. This is now corrected in her medications.

## 2010-09-08 NOTE — Telephone Encounter (Signed)
Pt

## 2010-09-08 NOTE — Telephone Encounter (Signed)
Spoke with pt, crestor is 20 mg not 10. Script corrected at the [employer]

## 2010-09-08 NOTE — Telephone Encounter (Signed)
On visit 5-4 Crestor was prescribed and on discharge sheet was told to take 20 mg daily but on the bottle it was 10 mg.  Which dose is correct for her to take.

## 2010-09-10 NOTE — Op Note (Signed)
[place]  Patient:    SRI, CLEGG Visit Number: [id number] MRN: [id number]          Service Type: END Location: ENDO Attending Physician:  Rich Brave Dictated by:   Florencia Reasons, M.D. Proc. Date: 09/12/01 Admit Date:  09/12/2001 Discharge Date: 09/12/2001                             Operative Report  PROCEDURE:  Colonoscopy with biopsy.  INDICATIONS:  This 76 year old female for colon cancer screening. Her last colonoscopy, five-and-half years ago, showed just a couple of hyperplastic polyps.  FINDINGS:  Normal exam to the cecum except for one tiny rectal sessile polyp, removed by cold biopsy technique.  PROCEDURE:  The nature, purpose, risks of the procedure were familiar to the patient from prior examination, and she provided written consent.  SEDATION:  Fentanyl 60 mcg and Versed 6 mg IV to a level of moderate sedation without arrhythmias or desaturation.  The Olympus adjustable tension pediatric video colonoscope was easily advanced to the cecum as I identified by clear visualization the appendiceal orifice and pull back was then performed.  There was a tiny hyperplastic-appearing polyp near the rectosigmoid juncture, removed by a single cold biopsy. No other polyps were seen and there was no evidence of cancer, colitis, vascular malformations, or diverticulosis. Retroflexion in the rectum was normal as was reinspection of the rectosigmoid.   The patient tolerated the procedure well and there were no apparent complications.  IMPRESSION:  Diminutive rectal polyp, otherwise normal exam.  PLAN:  Await pathology on polyp. Colonoscopic follow up in five years if it is adenoma that is in character, otherwise, consideration for flexible sigmoidoscopy in five years. Dictated by:   Florencia Reasons, M.D. Attending Physician:  Rich Brave DD:  09/12/01 TD:  09/13/01 Job: [id number]

## 2010-09-10 NOTE — Op Note (Signed)
Margaret [name], Margaret [name]            ACCOUNT NO.:  [id number]   MEDICAL RECORD NO.:  [id number]          PATIENT TYPE:  AMB   LOCATION:  ENDO                         FACILITY:  [facility]   PHYSICIAN:  Bernette Redbird, M.D.   DATE OF BIRTH:  06/17/1934   DATE OF PROCEDURE:  02/09/2004  DATE OF DISCHARGE:                                 OPERATIVE REPORT   PROCEDURE:  Upper endoscopy with biopsies.   INDICATIONS:  A 76 year old female with a roughly eight-month history of  dyspeptic symptomatology.   FINDINGS:  Gastric erosion and antral gastritis, consistent with aspirin  exposure.   PROCEDURE:  The nature, purposes, and risks of the procedure had been  discussed with the patient, who provided written consent.  Sedation was  fentanyl 30 mcg and Versed 3 mg IV without arrhythmias or desaturation.  There was transient oxygen desaturation down into the 80s, despite the small  amount of medication, but this responded nicely to turning up the O2 flow  rate and stimulating the patient.  There was no clinical instability during  this period.   The Olympus video endoscope was passed under direct vision.  The vocal cords  were not well seen.  The esophagus was normal, without evidence of reflux  esophagitis, Barrett's esophagus, varices, infection, neoplasia, or any  Mallory-Weiss tear.  There was a widely patent esophageal ring at the  squamocolumnar junction, and below this was a 2 cm hiatal hernia.  The  stomach contained no significant residual.  The antrum of the stomach had  patchy erythema, and a 4 mm erosion along the greater curve aspect.  No  frank ulcerations were noted.  There was no evidence of polyps or masses.  A  retroflexed view of the proximal stomach showed a little bit of mucosal  fragility and contact hemorrhage without evidence of any other  abnormalities.  The pylorus was normal.  The duodenal mucosa was normal.   Random duodenal biopsies were obtained, then I obtained  antral biopsies to  look for evidence of H. pylori infection, and finally, I biopsied the  erosion.   The scope was then withdrawn from the patient.  She tolerated the procedure  well, and there were no apparent complications.   IMPRESSION:  1.  Dyspepsia with gastritis findings, as described above, which may or may      not account for the dyspepsia noted.  (536.8).  2.  A small hiatal hernia with esophageal ring.   PLAN:  Await pathology results.  Since the patient's symptoms have been  improved recently, she has been off her ranitidine.  There was no necessity  to go back on it in the absence of symptoms.  If the symptoms recur,  consideration might be given to PPI therapy.      RB/MEDQ  D:  02/09/2004  T:  02/09/2004  Job:  [id number]   cc:   Reuel Boom L. Eda Paschal, M.D.  [address], [address]  [place]  [place] [address]  Fax: [phone]

## 2010-10-05 ENCOUNTER — Other Ambulatory Visit: Payer: Self-pay | Admitting: Cardiology

## 2010-10-05 ENCOUNTER — Other Ambulatory Visit (INDEPENDENT_AMBULATORY_CARE_PROVIDER_SITE_OTHER): Payer: Medicare Other | Admitting: *Deleted

## 2010-10-05 DIAGNOSIS — E7800 Pure hypercholesterolemia, unspecified: Secondary | ICD-10-CM

## 2010-10-05 DIAGNOSIS — I10 Essential (primary) hypertension: Secondary | ICD-10-CM

## 2010-10-05 LAB — LIPID PANEL
Cholesterol: 211 mg/dL — ABNORMAL HIGH (ref 0–200)
HDL: 72.8 mg/dL
Total CHOL/HDL Ratio: 3
Triglycerides: 76 mg/dL (ref 0.0–149.0)
VLDL: 15.2 mg/dL (ref 0.0–40.0)

## 2010-10-05 LAB — BASIC METABOLIC PANEL WITH GFR
BUN: 21 mg/dL (ref 6–23)
CO2: 25 meq/L (ref 19–32)
Calcium: 9.6 mg/dL (ref 8.4–10.5)
Chloride: 106 meq/L (ref 96–112)
Creatinine, Ser: 0.7 mg/dL (ref 0.4–1.2)
GFR: 84.11 mL/min
Glucose, Bld: 101 mg/dL — ABNORMAL HIGH (ref 70–99)
Potassium: 4.1 meq/L (ref 3.5–5.1)
Sodium: 139 meq/L (ref 135–145)

## 2010-10-05 LAB — HEPATIC FUNCTION PANEL
AST: 32 U/L (ref 0–37)
Alkaline Phosphatase: 40 U/L (ref 39–117)
Bilirubin, Direct: 0.2 mg/dL (ref 0.0–0.3)
Total Bilirubin: 0.7 mg/dL (ref 0.3–1.2)

## 2011-01-24 ENCOUNTER — Encounter: Payer: Self-pay | Admitting: Obstetrics and Gynecology

## 2011-02-10 ENCOUNTER — Other Ambulatory Visit: Payer: Self-pay | Admitting: Cardiology

## 2011-03-18 ENCOUNTER — Other Ambulatory Visit: Payer: Self-pay | Admitting: Cardiology

## 2011-04-15 ENCOUNTER — Encounter: Payer: Self-pay | Admitting: Obstetrics and Gynecology

## 2011-05-06 DIAGNOSIS — E785 Hyperlipidemia, unspecified: Secondary | ICD-10-CM | POA: Insufficient documentation

## 2011-05-06 DIAGNOSIS — M8580 Other specified disorders of bone density and structure, unspecified site: Secondary | ICD-10-CM | POA: Insufficient documentation

## 2011-05-06 DIAGNOSIS — F29 Unspecified psychosis not due to a substance or known physiological condition: Secondary | ICD-10-CM | POA: Diagnosis not present

## 2011-05-06 DIAGNOSIS — F068 Other specified mental disorders due to known physiological condition: Secondary | ICD-10-CM | POA: Diagnosis not present

## 2011-05-06 DIAGNOSIS — L900 Lichen sclerosus et atrophicus: Secondary | ICD-10-CM | POA: Insufficient documentation

## 2011-05-10 ENCOUNTER — Other Ambulatory Visit: Payer: Self-pay | Admitting: Obstetrics and Gynecology

## 2011-05-10 DIAGNOSIS — M8580 Other specified disorders of bone density and structure, unspecified site: Secondary | ICD-10-CM

## 2011-05-12 ENCOUNTER — Ambulatory Visit (INDEPENDENT_AMBULATORY_CARE_PROVIDER_SITE_OTHER): Payer: Medicare Other

## 2011-05-12 DIAGNOSIS — M899 Disorder of bone, unspecified: Secondary | ICD-10-CM | POA: Diagnosis not present

## 2011-05-12 DIAGNOSIS — M8580 Other specified disorders of bone density and structure, unspecified site: Secondary | ICD-10-CM

## 2011-05-12 DIAGNOSIS — M949 Disorder of cartilage, unspecified: Secondary | ICD-10-CM | POA: Diagnosis not present

## 2011-05-17 DIAGNOSIS — R413 Other amnesia: Secondary | ICD-10-CM | POA: Diagnosis not present

## 2011-05-19 ENCOUNTER — Encounter: Payer: Self-pay | Admitting: Obstetrics and Gynecology

## 2011-05-19 ENCOUNTER — Ambulatory Visit (INDEPENDENT_AMBULATORY_CARE_PROVIDER_SITE_OTHER): Payer: Medicare Other | Admitting: Obstetrics and Gynecology

## 2011-05-19 VITALS — BP 120/74 | Ht 61.0 in | Wt 107.0 lb

## 2011-05-19 DIAGNOSIS — R3129 Other microscopic hematuria: Secondary | ICD-10-CM | POA: Diagnosis not present

## 2011-05-19 DIAGNOSIS — N766 Ulceration of vulva: Secondary | ICD-10-CM | POA: Diagnosis not present

## 2011-05-19 DIAGNOSIS — M899 Disorder of bone, unspecified: Secondary | ICD-10-CM | POA: Diagnosis not present

## 2011-05-19 DIAGNOSIS — N952 Postmenopausal atrophic vaginitis: Secondary | ICD-10-CM | POA: Diagnosis not present

## 2011-05-19 DIAGNOSIS — M949 Disorder of cartilage, unspecified: Secondary | ICD-10-CM

## 2011-05-19 DIAGNOSIS — M8580 Other specified disorders of bone density and structure, unspecified site: Secondary | ICD-10-CM

## 2011-05-19 NOTE — Patient Instructions (Signed)
#  Stop Fosamax. 

## 2011-05-19 NOTE — Progress Notes (Signed)
Patient came back to see me today for further followup. She has been on Fosamax for 11 years. She recently had a bone density and she continues to have moderate osteopenia with her worse T score been -1.7. She did have some worsening of her bone in her total left hip. That was the only change. She's had no fractures. She continues calcium and vitamin D. Although FRAX scores are not meant to be used at this point we did one and was nonelevated. She does not have any risky behavior in terms of fracture. She is having no vaginal bleeding or pelvic pain. 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 She does not have vulvar itching or irritation to require treatment. She does have microscopic hematuria but has already  had a workup. She has atrophic vaginitis as well but is asymptomatic. She is not sexually active. For a while she did use vaginal estrogen but no longer is on it. She does her lab through her PCP.  Review of systems: 12 system review done. Patient has early dementia and is being treated with medication. She is also treated for hyperlipidemia and hypertension. Other pertinent positives listed above.  Physical examination: Kennon Portela present. HEENT within normal limits. Neck: Thyroid not large. No masses. Supraclavicular nodes: not enlarged. Breasts: Examined in both sitting midline position. No skin changes and no masses. Abdomen: Soft no guarding rebound or masses or hernia. Pelvic: External: leukoplakia on vulvar and perineum unchanged. BUS: Within normal limits. Vaginal:within normal limits. Poor estrogen effect. No evidence of cystocele rectocele or enterocele. Cervix: clean. Uterus: Normal size and shape. Adnexa: No masses. Rectovaginal exam: Confirmatory and negative. Extremities: Within normal limits.  Assessment: #1. Osteopenia #2. Atrophic vaginitis#3. Lichen sclerosis of vulva #4.microscopic hematuria  Plan: Verlon Au going to put her on  drug holiday from Fosamax. We will observe the rest of her medical problems.

## 2011-08-05 DIAGNOSIS — F29 Unspecified psychosis not due to a substance or known physiological condition: Secondary | ICD-10-CM | POA: Diagnosis not present

## 2011-08-26 DIAGNOSIS — L578 Other skin changes due to chronic exposure to nonionizing radiation: Secondary | ICD-10-CM | POA: Diagnosis not present

## 2011-08-26 DIAGNOSIS — L821 Other seborrheic keratosis: Secondary | ICD-10-CM | POA: Diagnosis not present

## 2011-09-01 DIAGNOSIS — H2510 Age-related nuclear cataract, unspecified eye: Secondary | ICD-10-CM | POA: Diagnosis not present

## 2011-09-01 DIAGNOSIS — Z961 Presence of intraocular lens: Secondary | ICD-10-CM | POA: Diagnosis not present

## 2011-09-07 ENCOUNTER — Encounter: Payer: Self-pay | Admitting: *Deleted

## 2011-09-08 ENCOUNTER — Encounter: Payer: Self-pay | Admitting: Cardiology

## 2011-09-08 ENCOUNTER — Encounter: Payer: Self-pay | Admitting: *Deleted

## 2011-09-08 ENCOUNTER — Ambulatory Visit (INDEPENDENT_AMBULATORY_CARE_PROVIDER_SITE_OTHER): Payer: Medicare Other | Admitting: Cardiology

## 2011-09-08 VITALS — BP 110/80 | HR 74 | Ht 60.0 in | Wt 103.0 lb

## 2011-09-08 DIAGNOSIS — I10 Essential (primary) hypertension: Secondary | ICD-10-CM | POA: Diagnosis not present

## 2011-09-08 DIAGNOSIS — E7800 Pure hypercholesterolemia, unspecified: Secondary | ICD-10-CM | POA: Diagnosis not present

## 2011-09-08 LAB — BASIC METABOLIC PANEL
CO2: 26 mEq/L (ref 19–32)
GFR: 97.88 mL/min (ref >60.00)
Glucose, Bld: 93 mg/dL (ref 70–99)
Potassium: 4.1 mEq/L (ref 3.5–5.1)
Sodium: 142 mEq/L (ref 135–145)

## 2011-09-08 LAB — LIPID PANEL
HDL: 83.4 mg/dL (ref >39.00)
Total CHOL/HDL Ratio: 2
Triglycerides: 62 mg/dL (ref 0.0–149.0)
VLDL: 12.4 mg/dL (ref 0.0–40.0)

## 2011-09-08 LAB — HEPATIC FUNCTION PANEL
Albumin: 4.3 g/dL (ref 3.5–5.2)
Total Bilirubin: 1.1 mg/dL (ref 0.3–1.2)

## 2011-09-08 NOTE — Assessment & Plan Note (Signed)
Continue statin. Check lipids and liver. 

## 2011-09-08 NOTE — Assessment & Plan Note (Signed)
Blood pressure controlled. Continue present medications. Check potassium and renal function. 

## 2011-09-08 NOTE — Patient Instructions (Signed)
Your physician wants you to follow-up in: ONE YEAR WITH DR CRENSHAW You will receive a reminder letter in the mail two months in advance. If you don't receive a letter, please call our office to schedule the follow-up appointment.   Your physician recommends that you HAVE LAB WORK TODAY 

## 2011-09-08 NOTE — Progress Notes (Signed)
   HPI: Margaret [name] is a very pleasant female who has hypertension and hyperlipidemia. An echocardiogram performed in November of 2008 showed normal LV function. There was trivial aortic insufficiency. I last saw her in May of 2012. Since then she denies any dyspnea on exertion, orthopnea, PND, pedal edema, palpitations, syncope or exertional chest pain.   Current Outpatient Prescriptions  Medication Sig Dispense Refill  . acetaminophen (TYLENOL) 650 MG CR tablet Take 650 mg by mouth every 8 (eight) hours as needed.        Marland Kitchen aspirin 81 MG tablet 81 mg. Pt only taking when she remembers.      . Calcium Carbonate-Vit D-Min (CALCIUM 1200 PO) Take 1 tablet by mouth daily.        . Cholecalciferol (VITAMIN D) 1000 UNITS capsule Take 1,000 Units by mouth daily.        . CRESTOR 20 MG tablet TAKE 1 TABLET BY MOUTH DAILY  30 tablet  6  . valsartan (DIOVAN) 320 MG tablet Take 1 tablet (320 mg total) by mouth daily.  30 tablet  12     Past Medical History  Diagnosis Date  . HTN (hypertension)     unspecified  . Hyperlipidemia     mixed  . Osteopenia   . Lichen sclerosus   . BRUIT     Past Surgical History  Procedure Date  . Tonsilectomy, adenoidectomy, bilateral myringotomy and tubes   . Oophorectomy 1988    DIAG LAP W BSO    History   Social History  . Marital Status: Married    Spouse Name: N/A    Number of Children: N/A  . Years of Education: N/A   Occupational History  .      full time   Social History Main Topics  . Smoking status: Former Games developer  . Smokeless tobacco: Not on file  . Alcohol Use: 3.5 oz/week    7 drink(s) per week  . Drug Use: No  . Sexually Active: No   Other Topics Concern  . Not on file   Social History Narrative  . No narrative on file    ROS: no fevers or chills, productive cough, hemoptysis, dysphasia, odynophagia, melena, hematochezia, dysuria, hematuria, rash, seizure activity, orthopnea, PND, pedal edema, claudication. Remaining systems  are negative.  Physical Exam: Well-developed well-nourished in no acute distress.  Skin is warm and dry.  HEENT is normal.  Neck is supple.  Chest is clear to auscultation with normal expansion.  Cardiovascular exam is regular rate and rhythm.  Abdominal exam nontender or distended. No masses palpated. Extremities show no edema. neuro grossly intact  ECG normal sinus rhythm, right atrial enlargement, no ST changes.

## 2011-12-31 ENCOUNTER — Other Ambulatory Visit: Payer: Self-pay | Admitting: Cardiology

## 2012-01-03 ENCOUNTER — Telehealth: Payer: Self-pay | Admitting: *Deleted

## 2012-01-03 NOTE — Telephone Encounter (Signed)
I still think she should have routine colonoscopy. Frequency should be decided by the gastroenterologist who does her colonoscopies.

## 2012-01-03 NOTE — Telephone Encounter (Signed)
Pt informed with the below note. 

## 2012-01-03 NOTE — Telephone Encounter (Signed)
Pt is calling asking if you think she should continue to have colonoscopy at her age. No abnormal finding in any past colonoscopy. Please advise

## 2012-01-23 ENCOUNTER — Other Ambulatory Visit: Payer: Self-pay | Admitting: Cardiology

## 2012-01-23 MED ORDER — VALSARTAN 320 MG PO TABS: ORAL_TABLET | ORAL | Status: DC

## 2012-01-25 DIAGNOSIS — R439 Unspecified disturbances of smell and taste: Secondary | ICD-10-CM | POA: Diagnosis not present

## 2012-01-25 DIAGNOSIS — F29 Unspecified psychosis not due to a substance or known physiological condition: Secondary | ICD-10-CM | POA: Diagnosis not present

## 2012-03-14 DIAGNOSIS — I10 Essential (primary) hypertension: Secondary | ICD-10-CM | POA: Diagnosis not present

## 2012-03-14 DIAGNOSIS — F068 Other specified mental disorders due to known physiological condition: Secondary | ICD-10-CM | POA: Diagnosis not present

## 2012-03-14 DIAGNOSIS — D170 Benign lipomatous neoplasm of skin and subcutaneous tissue of head, face and neck: Secondary | ICD-10-CM | POA: Diagnosis not present

## 2012-05-22 ENCOUNTER — Encounter: Payer: Medicare Other | Admitting: Gynecology

## 2012-08-27 ENCOUNTER — Non-Acute Institutional Stay: Payer: Medicare Other | Admitting: Internal Medicine

## 2012-08-27 ENCOUNTER — Encounter: Payer: Self-pay | Admitting: Internal Medicine

## 2012-08-27 VITALS — BP 100/62 | HR 72 | Temp 98.3°F | Ht 60.5 in | Wt 98.0 lb

## 2012-08-27 DIAGNOSIS — Z66 Do not resuscitate: Secondary | ICD-10-CM | POA: Diagnosis not present

## 2012-08-27 DIAGNOSIS — L237 Allergic contact dermatitis due to plants, except food: Secondary | ICD-10-CM

## 2012-08-27 DIAGNOSIS — I10 Essential (primary) hypertension: Secondary | ICD-10-CM

## 2012-08-27 DIAGNOSIS — L255 Unspecified contact dermatitis due to plants, except food: Secondary | ICD-10-CM

## 2012-08-27 MED ORDER — PREDNISONE 10 MG PO TABS: ORAL_TABLET | ORAL | Status: DC

## 2012-08-27 MED ORDER — TRIAMCINOLONE ACETONIDE 0.1 % EX CREA: TOPICAL_CREAM | CUTANEOUS | Status: DC

## 2012-08-27 NOTE — Patient Instructions (Signed)
Use cream sparingly daily to the rash. Take prednisone instructed. Return for recheck in 2 weeks.

## 2012-08-27 NOTE — Progress Notes (Signed)
  Subjective:    Patient ID: Margaret [name], female    DOB: December 25, 1934, 78 y.o.   MRN: [id number]  HPI Patient was working in her yard on Friday and Saturday. She began having an extensive rash appeared across lower abdomen, groin, face, arms, and legs. It is very itchy. There is a burning sensation. She is starting to blister on the arms.   Review of Systems  Constitutional: Negative.   HENT: Negative.   Eyes: Negative.   Respiratory: Negative.   Cardiovascular: Negative.   Gastrointestinal: Negative.   Endocrine: Negative.   Genitourinary: Negative.   Musculoskeletal: Negative.   Skin:       Extensive rash 48-2 days old. Patient believes it started after working in her garden and believes this is a poison ivy exposure.  Allergic/Immunologic: Negative.   Neurological: Negative.        Mild memory loss.   Hematological: Negative.   Psychiatric/Behavioral: Negative.        Objective:BP 100/62  Pulse 72  Temp(Src) 98.3 F (36.8 C) (Oral)  Ht 5' 0.5" (1.537 m)  Wt 98 lb (44.453 kg)  BMI 18.82 kg/m2      Physical Exam  Constitutional: No distress.  Thin frail elderly female  HENT:  Head: Normocephalic and atraumatic.  Right Ear: External ear normal.  Left Ear: External ear normal.  Nose: Nose normal.  Eyes: Conjunctivae and EOM are normal.  Neck: Normal range of motion. Neck supple. No JVD present. No tracheal deviation present. No thyromegaly present.  Cardiovascular: Normal rate, regular rhythm, normal heart sounds and intact distal pulses.  Exam reveals no gallop and no friction rub.   No murmur heard. Pulmonary/Chest: Effort normal and breath sounds normal.  Abdominal: Soft. Bowel sounds are normal. She exhibits no distension and no mass. There is no tenderness.  Musculoskeletal: Normal range of motion. She exhibits no edema.  Lymphadenopathy:    She has no cervical adenopathy.  Neurological: She is alert. No cranial nerve deficit. Coordination normal.  Skin:  Rash noted.  Extensive rash with early blistering showing up on the lower abdomen and pelvic area. Additional areas of concern involved the right jaw, left cheek, right forearm, left leg, and left arm. It is erythematous and tender to touch. Lipoma of the right cheek is unchanged.  Psychiatric: She has a normal mood and affect. Her behavior is normal. Thought content normal.       Assessment & Plan:   Poison ivy dermatitis - Plan: triamcinolone cream (KENALOG) 0.1 % and prednisone 10 mg Dosepak  HYPERTENSION, UNSPECIFIED: Blood pressures were low in 2013 and again today. She is on the highest possible dose of Diovan. I recommended discontinuation of the drug until she is rechecked.

## 2012-08-30 ENCOUNTER — Telehealth: Payer: Self-pay | Admitting: Cardiology

## 2012-08-30 NOTE — Telephone Encounter (Signed)
Spoke with Margaret [name], aware according to dr green's note, the pts diovan was stopped due to hypotension. She also questioned if pt should be taking crestor. Explained what crestor was and would probably discuss that at follow up appt 09-12-12.m she agreed with this plan.

## 2012-08-30 NOTE — Telephone Encounter (Signed)
New problem   pts caregiver has question about medications that she should be taking

## 2012-09-06 ENCOUNTER — Other Ambulatory Visit: Payer: Self-pay | Admitting: *Deleted

## 2012-09-06 ENCOUNTER — Other Ambulatory Visit: Payer: Self-pay | Admitting: Internal Medicine

## 2012-09-06 ENCOUNTER — Other Ambulatory Visit: Payer: Medicare Other

## 2012-09-06 DIAGNOSIS — I10 Essential (primary) hypertension: Secondary | ICD-10-CM

## 2012-09-06 DIAGNOSIS — E785 Hyperlipidemia, unspecified: Secondary | ICD-10-CM | POA: Diagnosis not present

## 2012-09-06 DIAGNOSIS — R413 Other amnesia: Secondary | ICD-10-CM

## 2012-09-07 LAB — CBC WITH DIFFERENTIAL/PLATELET
Basos: 1 % (ref 0–3)
Eos: 1 % (ref 0–5)
HCT: 42.5 % (ref 34.0–46.6)
Lymphocytes Absolute: 3.4 10*3/uL — ABNORMAL HIGH (ref 0.7–3.1)
MCV: 96 fL (ref 79–97)
Monocytes Absolute: 1 10*3/uL — ABNORMAL HIGH (ref 0.1–0.9)
RBC: 4.45 x10E6/uL (ref 3.77–5.28)
WBC: 12 10*3/uL — ABNORMAL HIGH (ref 3.4–10.8)

## 2012-09-07 LAB — COMPREHENSIVE METABOLIC PANEL
ALT: 19 IU/L (ref 0–32)
AST: 21 IU/L (ref 0–40)
Albumin/Globulin Ratio: 2.5 (ref 1.1–2.5)
Calcium: 10 mg/dL (ref 8.6–10.2)
GFR calc non Af Amer: 80 mL/min/{1.73_m2} (ref >59)
Potassium: 4.3 mmol/L (ref 3.5–5.2)
Sodium: 142 mmol/L (ref 134–144)
Total Bilirubin: 0.8 mg/dL (ref 0.0–1.2)

## 2012-09-07 LAB — LIPID PANEL: HDL: 95 mg/dL (ref >39)

## 2012-09-12 ENCOUNTER — Non-Acute Institutional Stay: Payer: Medicare Other | Admitting: Geriatric Medicine

## 2012-09-12 ENCOUNTER — Encounter: Payer: Self-pay | Admitting: Geriatric Medicine

## 2012-09-12 VITALS — BP 100/62 | HR 62 | Ht 61.0 in | Wt 95.0 lb

## 2012-09-12 DIAGNOSIS — L255 Unspecified contact dermatitis due to plants, except food: Secondary | ICD-10-CM

## 2012-09-12 DIAGNOSIS — L237 Allergic contact dermatitis due to plants, except food: Secondary | ICD-10-CM

## 2012-09-12 DIAGNOSIS — E785 Hyperlipidemia, unspecified: Secondary | ICD-10-CM

## 2012-09-12 DIAGNOSIS — I10 Essential (primary) hypertension: Secondary | ICD-10-CM

## 2012-09-12 DIAGNOSIS — F068 Other specified mental disorders due to known physiological condition: Secondary | ICD-10-CM | POA: Diagnosis not present

## 2012-09-12 DIAGNOSIS — G3184 Mild cognitive impairment, so stated: Secondary | ICD-10-CM

## 2012-09-12 NOTE — Progress Notes (Signed)
Patient ID: Margaret [name], female   DOB: Dec 10, 1934, 78 y.o.   MRN: [id number] [facility] [phone])  Chief Complaint  Patient presents with  . Medical Managment of Chronic Issues    blood pressure, memory, follow up poision ivy in groin     HPI: This 78 year old female returns to [facility] this afternoon in followup of memory, blood pressure and poison ivy rash. The rash has completely resolved, patient's blood pressure remains satisfactory off medications. Patient's memory and cognition is about the same. She is accepting assistance from personal care services at [facility] for meal delivery and errands. There been no functional statys changes reported from the caregivers.  Her short-term memory loss is evident  mostly with frequent phone calls about the same issue. Patient tells me she feels very well.  Functional status: Independent with all ADLs. Requires medication management. No longer driving  Allergies  Allergies  Allergen Reactions  . Sulfa Antibiotics    Medications Current outpatient prescriptions:acetaminophen (TYLENOL) 650 MG CR tablet, Take 650 mg by mouth. Take one as needed, Disp: , Rfl: ;  aspirin 81 MG tablet, 81 mg. Pt only taking when she remembers., Disp: , Rfl: ;  Calcium Carbonate-Vit D-Min (CALCIUM 1200 PO), Take 1 tablet by mouth daily. , Disp: , Rfl: ;  Cholecalciferol (VITAMIN D) 1000 UNITS capsule, Take 1,000 Units by mouth daily.  , Disp: , Rfl:  CRESTOR 20 MG tablet, TAKE 1 TABLET BY MOUTH DAILY, Disp: 30 tablet, Rfl: 6;  GALANTAMINE HYDROBROMIDE PO, Take 1 tablet by mouth. 8mg  take one tablet twice daily, Disp: , Rfl: ;  omeprazole (PRILOSEC) 20 MG capsule, Take 20 mg by mouth daily. As needed, Disp: , Rfl:  predniSONE (DELTASONE) 10 MG tablet, Take 6 tabs on day 1, 5 tabs on day 2, 4 tabs on day 3, 3 tabs on day 4, 2 tabs on day 5, 1 tab on day 6 and future days until seen again., Disp: 30 tablet, Rfl: 1;  triamcinolone cream  (KENALOG) 0.1 %, Applied sparingly to rash daily, Disp: 454 g, Rfl: 4  Data Reviewed       Lab: 08/25/2010 Dr. Imagene Gurney notes CBC, CMP, LFT normal TSH 1.77 RPR nonreactive  B12 464  Recent Results (from the past 2160 hour(s))  COMPREHENSIVE METABOLIC PANEL     Status: Abnormal   Collection Time    09/06/12  5:00 PM      Result Value Range   Glucose 77  65 - 99 mg/dL   BUN 23  8 - 27 mg/dL   Creatinine, Ser 9.81  0.57 - 1.00 mg/dL   GFR calc non Af Amer 80  >59 mL/min/1.73   GFR calc Af Amer 93  >59 mL/min/1.73   BUN/Creatinine Ratio 32 (*) 11 - 26   Sodium 142  134 - 144 mmol/L   Potassium 4.3  3.5 - 5.2 mmol/L   Chloride 103  97 - 108 mmol/L   CO2 27  19 - 28 mmol/L   Calcium 10.0  8.6 - 10.2 mg/dL   Total Protein 6.3  6.0 - 8.5 g/dL   Albumin 4.5  3.5 - 4.8 g/dL   Globulin, Total 1.8  1.5 - 4.5 g/dL   Albumin/Globulin Ratio 2.5  1.1 - 2.5   Total Bilirubin 0.8  0.0 - 1.2 mg/dL   Alkaline Phosphatase 46  39 - 117 IU/L   AST 21  0 - 40 IU/L   ALT 19  0 - 32 IU/L  LIPID PANEL     Status: Abnormal   Collection Time    09/06/12  5:00 PM      Result Value Range   Cholesterol, Total 256 (*) 100 - 199 mg/dL   Triglycerides 409  0 - 149 mg/dL   HDL 95  >81 mg/dL   Comment: According to ATP-III Guidelines, HDL-C >59 mg/dL is considered a     negative risk factor for CHD.   VLDL Cholesterol Cal 21  5 - 40 mg/dL   LDL Calculated 191 (*) 0 - 99 mg/dL   Chol/HDL Ratio 2.7  0.0 - 4.4 ratio units  CBC WITH DIFFERENTIAL     Status: Abnormal   Collection Time    09/06/12  5:00 PM      Result Value Range   WBC 12.0 (*) 3.4 - 10.8 x10E3/uL   RBC 4.45  3.77 - 5.28 x10E6/uL   Hemoglobin 13.9  11.1 - 15.9 g/dL   HCT 47.8  29.5 - 62.1 %   MCV 96  79 - 97 fL   MCH 31.2  26.6 - 33.0 pg   MCHC 32.7  31.5 - 35.7 g/dL   RDW 30.8  65.7 - 84.6 %   Neutrophils Relative % 61  40 - 74 %   Lymphs 28  14 - 46 %   Monocytes 9  4 - 12 %   Eos 1  0 - 5 %   Basos 1  0 - 3 %   Neutrophils  Absolute 7.3 (*) 1.4 - 7.0 x10E3/uL   Lymphocytes Absolute 3.4 (*) 0.7 - 3.1 x10E3/uL   Monocytes Absolute 1.0 (*) 0.1 - 0.9 x10E3/uL   Eosinophils Absolute 0.2  0.0 - 0.4 x10E3/uL   Basophils Absolute 0.1  0.0 - 0.2 x10E3/uL   Immature Granulocytes 0  0 - 2 %   Immature Grans (Abs) 0.0  0.0 - 0.1 x10E3/uL  TSH     Status: None   Collection Time    09/06/12  5:00 PM      Result Value Range   TSH 1.050  0.450 - 4.500 uIU/mL    SH  History   Social History Narrative   This patient is widowed. She lives alone  in a single level  home in [place], Personal Care Services from [facility] to assist with meals and transportation.    Patient is retired Firefighter. Main exercise is walking, she has a Development worker, international aid.   Stopped smoking 20 years ago, drinks 1-2 glasses of wine a few days a week.    Patient has 2 sons and one daughter, all live out of town.   Has a Living Will, DO NOT RESUSCITATE.   Review of Systems  DATA OBTAINED: from patient GENERAL: Feels well No fevers, fatigue, change in appetite  SKIN: No itch, rash or open wounds EYES: No eye pain, dryness or itching  No change in vision EARS: No earache, tinnitus, change in hearing NOSE: No congestion, drainage or bleeding MOUTH/THROAT: No mouth, tooth pain or sore throat  No difficulty chewing or swallowing RESPIRATORY: No cough, wheezing, SOB CARDIAC: No chest pain, palpitations  No edema. GI: No abdominal pain  No N/V/D or constipation  No heartburn or reflux  GU: No dysuria, frequency or urgency  No change in urine volume or character  MUSCULOSKELETAL: No joint pain, swelling or stiffness  No back pain  No muscle ache, pain, weakness  Gait is steady  No recent falls.  NEUROLOGIC: No  dizziness, fainting, headache,   No change in mental status.  PSYCHIATRIC: No feelings of anxiety, depression Sleeps well.  No behavior issue.   Physical Exam Filed Vitals:   09/12/12 1054  BP: 100/62  Pulse: 62  Height: 5'  1" (1.549 m)  Weight: 95 lb (43.092 kg)  102lb 04/2012   Body mass index is 17.96 kg/(m^2).  GENERAL APPEARANCE: No acute distress, appropriately groomed, normal body habitus. Alert, pleasant, conversant. SKIN: Rash of right jaw, left cheek, both arms, left leg, lower abdomen, bilateral groins are healed. Some skin discoloration remains HEAD: Normocephalic, atraumatic EYES: Conjunctiva/lids clear. Pupils round, reactive. EOMs intact.  EARS: External exam WNL, canals clear, TM WNL. Hearing grossly normal. NOSE: No deformity or discharge. MOUTH/THROAT: Lips w/o lesions. Oral mucosa, tongue moist, w/o lesion. Oropharynx w/o redness or lesions.  NECK: Supple, full ROM. No thyroid tenderness, enlargement or nodule LYMPHATICS: No head, neck or supraclavicular adenopathy RESPIRATORY: Breathing is even, unlabored. Lung sounds are clear and full.  CARDIOVASCULAR: Heart RRR. No murmur or extra heart sounds  ARTERIAL: No carotid bruit.   VENOUS: No varicosities. No venous stasis skin changes  EDEMA: No peripheral  edema.  GASTROINTESTINAL: Abdomen is soft, non-tender, not distended w/ normal bowel sounds.  MUSCULOSKELETAL: Moves all extremities with full ROM, strength and tone. Back is without kyphosis, scoliosis or spinal process tenderness. Gait is steady NEUROLOGIC: Oriented to time, place, person. Cranial nerves 2-12 grossly intact, speech clear, no tremor.  PSYCHIATRIC: Mood and affect appropriate to situation  ASSESSMENT/PLAN   HYPERTENSION, UNSPECIFIED Blood pressure remained satisfactory, no antihypertensive medicines required. Recent labs satisfactory  Poison ivy dermatitis Diagnosed 08/27/2012, treated with topical ointment. Rash is completely resolved. "I am no longer pulling weeds!"  Mild cognitive impairment Patient's memory status is about the same, continues with short-term memory loss. She reports she is no longer driving, personal care services help with parenting groceries. She  now has meals delivered from [facility] language skills remain intact, she continues to  Rationalize her need for assistance by saying she just doesn't feel like doing these things any longer. Spends most of her time at home does walk her dog daily with neighbors. Patient has had more weight loss since last visit however the meal delivery from [facility] has just started in the last few weeks.  Hopefully, with regular meals available in her home her weight will stabilize. Patient tells me she knows her son has a place for her at [facility], "I'm just not ready for that".  HYPERLIPIDEMIA-MIXED  Patient continues Crestor, current lab appears satisfactory. Continue medication   Follow up: 3 months Family communication: Discussed today's visit, findings including laboratory studies with patient's son Loraine Leriche.  Grover Robinson T.Keitha Kolk, NP-C 09/12/2012

## 2012-09-21 ENCOUNTER — Encounter: Payer: Self-pay | Admitting: Geriatric Medicine

## 2012-09-21 DIAGNOSIS — G3184 Mild cognitive impairment, so stated: Secondary | ICD-10-CM | POA: Insufficient documentation

## 2012-09-21 DIAGNOSIS — F068 Other specified mental disorders due to known physiological condition: Secondary | ICD-10-CM | POA: Insufficient documentation

## 2012-09-21 NOTE — Assessment & Plan Note (Signed)
Blood pressure remained satisfactory, no antihypertensive medicines required. Recent labs satisfactory

## 2012-09-21 NOTE — Assessment & Plan Note (Addendum)
Patient continues Crestor, current lab appears satisfactory. Continue medication

## 2012-09-21 NOTE — Assessment & Plan Note (Signed)
Patient's memory status is about the same, continues with short-term memory loss. She reports she is no longer driving, personal care services help with parenting groceries. She now has meals delivered from [facility] language skills remain intact, she continues to  Rationalize her need for assistance by saying she just doesn't feel like doing these things any longer. Spends most of her time at home does walk her dog daily with neighbors. Patient has had more weight loss since last visit however the meal delivery from [facility] has just started in the last few weeks.  Hopefully, with regular meals available in her home her weight will stabilize. Patient tells me she knows her son has a place for her at [facility], "I'm just not ready for that". 

## 2012-09-21 NOTE — Assessment & Plan Note (Signed)
Patient's memory status is about the same, continues with short-term memory loss. She reports she is no longer driving, personal care services help with parenting groceries. She now has meals delivered from [facility] language skills remain intact, she continues to  Rationalize her need for assistance by saying she just doesn't feel like doing these things any longer. Spends most of her time at home does walk her dog daily with neighbors. Patient has had more weight loss since last visit however the meal delivery from [facility] has just started in the last few weeks.  Hopefully, with regular meals available in her home her weight will stabilize. Patient tells me she knows her son has a place for her at [facility], "I'm just not ready for that".

## 2012-09-21 NOTE — Assessment & Plan Note (Signed)
Diagnosed 08/27/2012, treated with topical ointment. Rash is completely resolved. "I am no longer pulling weeds!"

## 2012-10-04 ENCOUNTER — Encounter: Payer: Self-pay | Admitting: Cardiology

## 2013-01-26 ENCOUNTER — Other Ambulatory Visit: Payer: Self-pay | Admitting: Cardiology

## 2013-01-30 ENCOUNTER — Other Ambulatory Visit: Payer: Self-pay

## 2013-01-30 ENCOUNTER — Other Ambulatory Visit: Payer: Self-pay | Admitting: *Deleted

## 2013-01-30 MED ORDER — GALANTAMINE HYDROBROMIDE ER 24 MG PO CP24: ORAL_CAPSULE | ORAL | Status: DC

## 2013-01-30 MED ORDER — ROSUVASTATIN CALCIUM 20 MG PO TABS: ORAL_TABLET | ORAL | Status: DC

## 2013-01-30 MED ORDER — VALSARTAN 320 MG PO TABS: 320.0000 mg | ORAL_TABLET | Freq: Every day | ORAL | Status: DC

## 2013-03-01 ENCOUNTER — Ambulatory Visit: Payer: Medicare Other | Admitting: Cardiology

## 2013-03-13 ENCOUNTER — Encounter: Payer: Self-pay | Admitting: Geriatric Medicine

## 2013-04-01 ENCOUNTER — Encounter: Payer: Self-pay | Admitting: Cardiology

## 2013-04-01 ENCOUNTER — Ambulatory Visit (INDEPENDENT_AMBULATORY_CARE_PROVIDER_SITE_OTHER): Payer: Medicare Other | Admitting: Cardiology

## 2013-04-01 VITALS — BP 138/102 | HR 70 | Ht 60.0 in | Wt 102.0 lb

## 2013-04-01 DIAGNOSIS — I10 Essential (primary) hypertension: Secondary | ICD-10-CM | POA: Diagnosis not present

## 2013-04-01 DIAGNOSIS — E785 Hyperlipidemia, unspecified: Secondary | ICD-10-CM

## 2013-04-01 NOTE — Assessment & Plan Note (Signed)
Blood pressure mildly elevated. I have asked her to follow his at home and we will add medications as needed. Check potassium and renal function.

## 2013-04-01 NOTE — Progress Notes (Signed)
HPI: Ms. Dohmen is a very pleasant female who has hypertension and hyperlipidemia. An echocardiogram performed in November of 2008 showed normal LV function. There was trivial aortic insufficiency. I last saw her in May of 2013. Since then she denies any dyspnea on exertion, orthopnea, PND, pedal edema, palpitations, syncope or exertional chest pain.   Current Outpatient Prescriptions  Medication Sig Dispense Refill  . acetaminophen (TYLENOL) 650 MG CR tablet Take 650 mg by mouth. Take one as needed      . aspirin 81 MG tablet 81 mg. Pt only taking when she remembers.      . Calcium Carbonate-Vit D-Min (CALCIUM 1200 PO) Take 1 tablet by mouth daily.       Marland Kitchen galantamine (RAZADYNE ER) 24 MG 24 hr capsule Take one capsule by mouth once daily  30 capsule  5  . omeprazole (PRILOSEC) 20 MG capsule Take 20 mg by mouth daily. As needed      . rosuvastatin (CRESTOR) 20 MG tablet TAKE 1 TABLET BY MOUTH DAILY  30 tablet  1  . valsartan (DIOVAN) 320 MG tablet Take 1 tablet (320 mg total) by mouth daily.  30 tablet  1   No current facility-administered medications for this visit.     Past Medical History  Diagnosis Date  . HTN (hypertension)     unspecified  . Hyperlipidemia     mixed  . Osteopenia   . Lichen sclerosus   . BRUIT   . Disturbances of sensation of smell and taste   . Lipoma of face   . Other persistent mental disorders due to conditions classified elsewhere   . Memory loss     Past Surgical History  Procedure Laterality Date  . Tonsilectomy, adenoidectomy, bilateral myringotomy and tubes    . Oophorectomy  1988    DIAG LAP W BSO  . Cataract extraction, bilateral      History   Social History  . Marital Status: Married    Spouse Name: N/A    Number of Children: N/A  . Years of Education: N/A   Occupational History  .      full time   Social History Main Topics  . Smoking status: Former Smoker    Quit date: 09/13/2002  . Smokeless tobacco: Never Used    . Alcohol Use: 7.7 oz/week    7 Drinks containing 0.5 oz of alcohol, 7 Glasses of wine per week  . Drug Use: No  . Sexual Activity: No   Other Topics Concern  . Not on file   Social History Narrative   This patient is widowed. She lives alone  in a single level  home in [place], Personal Care Services from [facility] to assist with meals and transportation.    Patient is retired Firefighter. Main exercise is walking, she has a Development worker, international aid.   Stopped smoking 20 years ago, drinks 1-2 glasses of wine a few days a week.    Patient has 2 sons and one daughter, all live out of town.   Has a Living Will, DO NOT RESUSCITATE.    ROS: no fevers or chills, productive cough, hemoptysis, dysphasia, odynophagia, melena, hematochezia, dysuria, hematuria, rash, seizure activity, orthopnea, PND, pedal edema, claudication. Remaining systems are negative.  Physical Exam: Well-developed well-nourished in no acute distress.  Skin is warm and dry.  HEENT is normal.  Neck is supple.  Chest is clear to auscultation with normal expansion.  Cardiovascular exam is  regular rate and rhythm.  Abdominal exam nontender or distended. No masses palpated. Extremities show no edema. neuro grossly intact  ECG sinus rhythm at a rate of 70. Nonspecific ST changes.

## 2013-04-01 NOTE — Patient Instructions (Signed)
Your physician wants you to follow-up in: ONE YEAR WITH DR CRENSHAW You will receive a reminder letter in the mail two months in advance. If you don't receive a letter, please call our office to schedule the follow-up appointment.   Your physician recommends that you return for lab work WHEN FASTING 

## 2013-04-01 NOTE — Assessment & Plan Note (Signed)
Continue statin. Check lipids and liver. 

## 2013-05-29 ENCOUNTER — Encounter: Payer: Self-pay | Admitting: Geriatric Medicine

## 2013-05-29 ENCOUNTER — Non-Acute Institutional Stay: Payer: Medicare Other | Admitting: Geriatric Medicine

## 2013-05-29 VITALS — BP 110/68 | HR 76 | Ht 60.0 in | Wt 100.0 lb

## 2013-05-29 DIAGNOSIS — G3184 Mild cognitive impairment, so stated: Secondary | ICD-10-CM | POA: Diagnosis not present

## 2013-05-29 DIAGNOSIS — E785 Hyperlipidemia, unspecified: Secondary | ICD-10-CM | POA: Diagnosis not present

## 2013-05-29 DIAGNOSIS — I10 Essential (primary) hypertension: Secondary | ICD-10-CM | POA: Diagnosis not present

## 2013-05-29 NOTE — Assessment & Plan Note (Addendum)
Patient continues with short-term memory issues, speech remains intact as are social interactions. She is however unaware of her poor personal hygiene; will not bathe or change clothes. Weight remains low despite meals prepared / delivered to her. It is unclear how much of these meals she is eating; does like to eat sweets.  Home situation appears  Safe with caregiver in daily. Repeat routine lab. Discussed findings with daughter, Margaret [name] via telephone.

## 2013-05-29 NOTE — Progress Notes (Signed)
Patient ID: Margaret [name], female   DOB: [birth year], 79 y.o.   MRN: [id number]   [employer] [phone])  Code Status: Full Code     Contact Information   Name Relation Home Work [place] Daughter [phone]         Chief Complaint  Patient presents with  . Medical Managment of Chronic Issues    memory problems per family With [facility] (patient had her wait in waiting room)    HPI: This is a 79 y.o. female  evaluated today for management of ongoing medical issues.    Last visit: HYPERTENSION, UNSPECIFIED Blood pressure remained satisfactory, no antihypertensive medicines required. Recent labs satisfactory  Poison ivy dermatitis Diagnosed 08/27/2012, treated with topical ointment. Rash is completely resolved. "I am no longer pulling weeds!"  Mild cognitive impairment Patient's memory status is about the same, continues with short-term memory loss. She reports she is no longer driving, personal care services help with parenting groceries. She now has meals delivered from [facility] language skills remain intact, she continues to  Rationalize her need for assistance by saying she just doesn't feel like doing these things any longer. Spends most of her time at home does walk her dog daily with neighbors. Patient has had more weight loss since last visit however the meal delivery from [place] has just started in the last few weeks.  Hopefully, with regular meals available in her home her weight will stabilize. Patient tells me she knows her son has a place for her at [place], "I'm just not ready for that".  HYPERLIPIDEMIA-MIXED  Patient continues Crestor, current lab appears satisfactory. Continue medication   Since last visit, patient has not had any acute medical issues. She continues to have a caregiver daily from 12 noon 4 PM. Caregiver reports this patient is very repetitive, often in about a 30 second loop. She doesn't want  to go out for any activity besides walking her dog. Specifically she does not want to go to [facility] for meals or any activities. Caregiver reports patient is not bathing or changing her clothes. Meals are delivered daily, the patient appears to be eating only sweets when caregiver is not present. Patient does continue to walk her dog in her neighborhood, no significant problems with this activity. Patient is unaware of any deficits, reports that she showers and change her clothes daily, eats 3 meals a day. In fact reports " meals are delicious from [facility]". Does acknowledge some short-term memory problems but "who doesn't?". The patient was evaluated by Dr. Stanford Breed, cardiology, in December 2014. He recommended lab studies, I don't see that these were completed.     Allergies  Allergen Reactions  . Sulfa Antibiotics        Medication List       This list is accurate as of: 05/29/13  2:50 PM.  Always use your most recent med list.               acetaminophen 650 MG CR tablet  Commonly known as:  TYLENOL  Take 650 mg by mouth. Take one as needed     aspirin 81 MG tablet  81 mg. Pt only taking when she remembers.     CALCIUM 1200 PO  Take 1 tablet by mouth daily.     galantamine 24 MG 24 hr capsule  Commonly known as:  RAZADYNE ER  Take one capsule by mouth once daily     rosuvastatin 20 MG tablet  Commonly known as:  CRESTOR  TAKE 1 TABLET BY MOUTH DAILY     valsartan 320 MG tablet  Commonly known as:  DIOVAN  Take 1 tablet (320 mg total) by mouth daily.        DATA REVIEWED  Radiologic Exams:   Cardiovascular Exams:   Laboratory Studies:    Lab Results-  09/06/2012  Component Value   WBC 12.0*   HGB 13.9   HCT 42.5       GLUCOSE 77   CHOL 256   TRIG 103   HDL 95   LDLCALC 140*   ALT 19   AST 21   NA 142   K 4.3   CREATININE 0.73   BUN 23       TSH 1.050      REVIEW OF SYSTEMS - see HPI for caregiver report DATA OBTAINED: from  patient GENERAL: Feels well   No fevers, fatigue, change in appetite or weight SKIN: No itch, rash or open wounds EYES: No eye pain, dryness or itching  No change in vision EARS: No earache, tinnitus, change in hearing NOSE: No congestion, drainage or bleeding MOUTH/THROAT: No mouth or tooth pain  No sore throat No difficulty chewing or swallowing RESPIRATORY: No cough, wheezing, SOB CARDIAC: No chest pain, palpitations  No edema. GI: No abdominal pain  No N/V/D or constipation  No heartburn or reflux  GU: No dysuria, frequency or urgency  No change in urine volume or character No incontinence MUSCULOSKELETAL: No joint pain, swelling or stiffness  No back pain  No muscle ache, pain, weakness  Gait is steady  No recent falls.  NEUROLOGIC: No dizziness, fainting, headache, .  PSYCHIATRIC: No feelings of anxiety, depression Sleeps well.     PHYSICAL EXAM Filed Vitals:   05/29/13 1412  BP: 110/68  Pulse: 76  Height: 5' (1.524 m)  Weight: 100 lb (45.36 kg)   Body mass index is 19.53 kg/(m^2). GENERAL APPEARANCE: No acute distress, thin body habitus. Hair appears dirty, clothes are disheveled, mild body odor is present.   Alert, pleasant, conversant. SKIN: No diaphoresis, HEAD: Normocephalic, atraumatic EYES: Conjunctiva/lids clear. Pupils round, reactive.  EARS: External exam WNL,. Hearing grossly normal. NOSE: No deformity or discharge. MOUTH/THROAT: Lips w/o lesions. Oral mucosa, tongue moist, w/o lesion. Oropharynx w/o redness or lesions.  NECK: Supple, full ROM. No thyroid tenderness, enlargement or nodule LYMPHATICS: No head, neck or supraclavicular adenopathy RESPIRATORY: Breathing is even, unlabored. Lung sounds are clear and full.  CARDIOVASCULAR: Heart RRR. No murmur or extra heart sounds   EDEMA: No peripheral edema.  MUSCULOSKELETAL: Moves all extremities with full ROM, strength and tone. Back is without kyphosis, scoliosis or spinal process tenderness. Gait is  steady NEUROLOGIC:  Cranial nerves 2-12 grossly intact, speech clear, no tremor.  MMSE 23/30( 2/5 time orientation, 3/5 place orientation, 4/5 in attention calculation, 2/3 recall) Passed Clock test PSYCHIATRIC: Mood and affect appropriate to situation  ASSESSMENT/PLAN  Mild cognitive impairment Patient continues with short-term memory issues, speech remains intact as are social interactions. She is however unaware of her poor personal hygiene; will not bathe or change clothes. Weight remains low despite meals prepared / delivered to her. It is unclear how much of these meals she is eating; does like to eat sweets.  Home situation appears  Safe with caregiver in daily. Repeat routine lab. Discussed findings with daughter, Santiago Glad via telephone.  HYPERLIPIDEMIA-MIXED Lab was not completed in Dec. Will schedule at [facility], UNSPECIFIED Blood  pressure remains well-controlled on current medication, update  lab    Follow up: 6 months  Lab CBC, CMP, TSH, lipid panel  Bryker Fletchall T.Caydan Mctavish, NP-C 05/29/2013

## 2013-05-29 NOTE — Assessment & Plan Note (Signed)
Blood pressure remains well-controlled on current medication, update  lab

## 2013-05-29 NOTE — Assessment & Plan Note (Signed)
Lab was not completed in Dec. Will schedule at [facility]

## 2013-05-29 NOTE — Progress Notes (Signed)
Passed clock drawing 

## 2013-06-04 ENCOUNTER — Other Ambulatory Visit: Payer: Self-pay

## 2013-06-04 ENCOUNTER — Other Ambulatory Visit: Payer: Medicare Other

## 2013-06-04 DIAGNOSIS — G3184 Mild cognitive impairment, so stated: Secondary | ICD-10-CM | POA: Diagnosis not present

## 2013-06-04 DIAGNOSIS — E785 Hyperlipidemia, unspecified: Secondary | ICD-10-CM

## 2013-06-04 DIAGNOSIS — T887XXA Unspecified adverse effect of drug or medicament, initial encounter: Secondary | ICD-10-CM

## 2013-06-05 LAB — COMPREHENSIVE METABOLIC PANEL
A/G RATIO: 2.7 — AB (ref 1.1–2.5)
ALK PHOS: 43 IU/L (ref 39–117)
ALT: 13 IU/L (ref 0–32)
AST: 20 IU/L (ref 0–40)
Albumin: 4.6 g/dL (ref 3.5–4.8)
BILIRUBIN TOTAL: 0.9 mg/dL (ref 0.0–1.2)
BUN / CREAT RATIO: 23 (ref 11–26)
BUN: 15 mg/dL (ref 8–27)
CALCIUM: 9.6 mg/dL (ref 8.7–10.3)
CO2: 22 mmol/L (ref 18–29)
Chloride: 103 mmol/L (ref 97–108)
Creatinine, Ser: 0.66 mg/dL (ref 0.57–1.00)
GFR calc Af Amer: 99 mL/min/{1.73_m2} (ref >59)
GFR calc non Af Amer: 86 mL/min/{1.73_m2} (ref >59)
Globulin, Total: 1.7 g/dL (ref 1.5–4.5)
Glucose: 104 mg/dL — ABNORMAL HIGH (ref 65–99)
POTASSIUM: 4.4 mmol/L (ref 3.5–5.2)
Sodium: 141 mmol/L (ref 134–144)
Total Protein: 6.3 g/dL (ref 6.0–8.5)

## 2013-06-05 LAB — TSH: TSH: 1.04 u[IU]/mL (ref 0.450–4.500)

## 2013-06-05 LAB — LIPID PANEL
CHOL/HDL RATIO: 2.2 ratio (ref 0.0–4.4)
CHOLESTEROL TOTAL: 206 mg/dL — AB (ref 100–199)
HDL: 93 mg/dL (ref >39)
LDL CALC: 99 mg/dL (ref 0–99)
TRIGLYCERIDES: 70 mg/dL (ref 0–149)
VLDL Cholesterol Cal: 14 mg/dL (ref 5–40)

## 2013-06-05 LAB — CBC WITH DIFFERENTIAL/PLATELET
BASOS ABS: 0.1 10*3/uL (ref 0.0–0.2)
Basos: 1 %
EOS ABS: 0.1 10*3/uL (ref 0.0–0.4)
Eos: 1 %
HCT: 38.9 % (ref 34.0–46.6)
HEMOGLOBIN: 14 g/dL (ref 11.1–15.9)
IMMATURE GRANS (ABS): 0 10*3/uL (ref 0.0–0.1)
Immature Granulocytes: 0 %
LYMPHS: 29 %
Lymphocytes Absolute: 2.5 10*3/uL (ref 0.7–3.1)
MCH: 31.8 pg (ref 26.6–33.0)
MCHC: 36 g/dL — AB (ref 31.5–35.7)
MCV: 88 fL (ref 79–97)
Monocytes Absolute: 0.6 10*3/uL (ref 0.1–0.9)
Monocytes: 7 %
Neutrophils Absolute: 5.3 10*3/uL (ref 1.4–7.0)
Neutrophils Relative %: 62 %
RBC: 4.4 x10E6/uL (ref 3.77–5.28)
RDW: 13.9 % (ref 12.3–15.4)
WBC: 8.6 10*3/uL (ref 3.4–10.8)

## 2013-06-06 NOTE — Addendum Note (Signed)
Addended by: Logan Bores on: 06/06/2013 09:39 AM   Modules accepted: Orders

## 2013-07-17 ENCOUNTER — Non-Acute Institutional Stay: Payer: Medicare Other | Admitting: Geriatric Medicine

## 2013-07-17 ENCOUNTER — Encounter: Payer: Self-pay | Admitting: Geriatric Medicine

## 2013-07-17 VITALS — BP 104/62 | HR 64 | Temp 98.1°F | Wt 100.0 lb

## 2013-07-17 DIAGNOSIS — K3189 Other diseases of stomach and duodenum: Secondary | ICD-10-CM | POA: Diagnosis not present

## 2013-07-17 DIAGNOSIS — G3184 Mild cognitive impairment, so stated: Secondary | ICD-10-CM | POA: Diagnosis not present

## 2013-07-17 DIAGNOSIS — R1013 Epigastric pain: Secondary | ICD-10-CM | POA: Diagnosis not present

## 2013-07-17 MED ORDER — RANITIDINE HCL 75 MG PO TABS: 75.0000 mg | ORAL_TABLET | Freq: Every day | ORAL | Status: DC

## 2013-07-17 NOTE — Progress Notes (Signed)
Patient ID: Margaret [name], female   DOB: 19-Nov-1934, 79 y.o.   MRN: [id number]   Margaret [name] [phone])  Code Status: DNR  Contact Information   Name Relation Home Work [place] Daughter [phone]         Chief Complaint  Patient presents with  . Abdominal Pain    for 3-4 weeks, at least 3 days a week. No nausea, vominting, diarrhea, constipation, fever.    HPI: This is a 79 y.o. female.  Evaluation is requested today due to abdominal pain for last several weeks. Patient describes this as an uncomfortable feeling; queasy. She denies any nausea, vomiting, diarrhea or constipation. Reports that she does have a daily bowel movement. Denies any dysuria. Does not experience nighttime abdominal discomfort. Patient's eating habits are sporadic, frequently does not eat breakfast. Caregiver reports patient often doesn't eat much at dinner either, patient tells me she eats just fine. Food is delivered from [facility] daily, caregiver reports finding out of date food in patient's refrigerator and freezer.  Caregiver expresses concern about discolored lesion over patient's left eyebrow.    Allergies  Allergen Reactions  . Sulfa Antibiotics     MEDICATIONS -     Medication List       This list is accurate as of: 07/17/13 10:37 AM.  Always use your most recent med list.               acetaminophen 650 MG CR tablet  Commonly known as:  TYLENOL  Take 650 mg by mouth. Take one as needed     aspirin 81 MG tablet  81 mg. Pt only taking when she remembers.     CALCIUM 1200 PO  Take 1 tablet by mouth daily.     galantamine 24 MG 24 hr capsule  Commonly known as:  RAZADYNE ER  Take one capsule by mouth once daily     rosuvastatin 20 MG tablet  Commonly known as:  CRESTOR  TAKE 1 TABLET BY MOUTH DAILY     valsartan 320 MG tablet  Commonly known as:  DIOVAN  Take 1 tablet (320 mg total) by mouth daily.         DATA  REVIEWED  Laboratory Studies: Lab Results  Component Value Date   WBC 8.6 06/04/2013   HGB 14.0 06/04/2013   HCT 38.9 06/04/2013   MCV 88 06/04/2013   Lab Results  Component Value Date   NA 141 06/04/2013   K 4.4 06/04/2013   BUN 15 06/04/2013   CREATININE 0.66 06/04/2013   Lab Results  Component Value Date   CALCIUM 9.6 06/04/2013   ALBUMIN 4.3 09/08/2011   AST 20 06/04/2013   ALT 13 06/04/2013   ALKPHOS 43 06/04/2013   BILITOT 0.9 06/04/2013   GFRNONAA 86 06/04/2013   GFRAA 99 06/04/2013   No results found for this basename: vitaminb12, vitamind     REVIEW OF SYSTEMS  DATA OBTAINED: from patient,  Caregiver GENERAL: Feels well   No recent fever, fatigue, change in appetite or weight - see HPI SKIN: No itch, rash or open wounds MOUTH/THROAT: No mouth or tooth pain  No sore throat   No difficulty chewing or swallowing RESPIRATORY: No cough, wheezing, SOB CARDIAC: No chest pain, palpitations  No edema. GI: Abdominal discomfort- see HPI  No nausea, vomiting,diarrhea or constipation  No heartburn or reflux  GU: No dysuria, frequency or urgency  No change in urine volume or character  MUSCULOSKELETAL: No joint pain, swelling or stiffness  No back pain  No muscle ache, pain, weakness  Gait is steady  No recent falls.  NEUROLOGIC: No dizziness, fainting, headache, numbness  No change in mental status (STML).  PSYCHIATRIC: No feelings of anxiety, depression  Sleeps well.       PHYSICAL EXAM Filed Vitals:   07/17/13 0933  BP: 104/62  Pulse: 64  Temp: 98.1 F (36.7 C)  TempSrc: Oral  Weight: 100 lb (45.36 kg)   Body mass index is 19.53 kg/(m^2). GENERAL APPEARANCE: No acute distress, appropriately groomed, Thin body habitus. Alert, pleasant, conversant. SKIN: No diaphoresis, rash. Multiple brown discolorations on patient's forehead. There is one larger lesion approximately 2 cm x 1.5 cm flat, rectangle shape. Color is light to medium brown, edges are well circumscribed, skin is  intact. HEAD: Normocephalic, atraumatic EYES: Conjunctiva/lids clear.  GASTROINTESTINAL: Abdomen is soft, non-tender, not distended w/ normal bowel sounds. No hepatic or splenic enlargement. No mass, ventral or inguinal hernia. GENITOURINARY: Bladder non tender, not distended. MUSCULOSKELETAL: Moves all extremities with full ROM, strength and tone. Back is without kyphosis, scoliosis or spinal process tenderness. Gait is steady NEUROLOGIC: Oriented to time, place, person. Speech clear, no tremor. STML evident, repeats same questions PSYCHIATRIC: Mood and affect appropriate to situation   ASSESSMENT/PLAN  Dyspepsia Mild abdominal discomfort, queasiness. Occurs multiple times during the week. This patient does not eat regularly despite availability of food in her home (food is delivered from [employer]). It is apparent patient does not remember to eat unless prompted. Caregivers attempt to provide meals when they are there, patient often refuses more than small snacks. Other times eats very well.  Recommend small frequent meals if possible. Add ranitidine at bedtime, as increased gastric secretions may be contributing to symptoms.    Mild cognitive impairment STML is impacting patient's ability to eat regularly. Would benefit from Assisted Living living situation. This was not discussed today as patient is unaware of how her memory problems affect her. Will contact family.     Follow up: No Follow-up on file.  [name], NP-C [facility] [phone]  07/17/2013  Signed by Dr. Jeanmarie Hubert, since this was left unfinished at departure of C. Krell from this medical practice.

## 2013-07-17 NOTE — Assessment & Plan Note (Signed)
Mild abdominal discomfort, queasiness. Occurs multiple times during the week. This patient does not eat regularly despite availability of food in her home (food is delivered from [place]). It is apparent patient does not remember to eat unless prompted. Caregivers attempt to provide meals when they are there, patient often refuses more than small snacks. Other times eats very well.  Recommend small frequent meals if possible. Add ranitidine at bedtime, as increased gastric secretions may be contributing to symptoms.

## 2013-07-17 NOTE — Assessment & Plan Note (Addendum)
STML is impacting patient's ability to eat regularly. Would benefit from Assisted Living living situation. This was not discussed today as patient is unaware of how her memory problems affect her. Will contact family.

## 2013-07-26 ENCOUNTER — Other Ambulatory Visit: Payer: Self-pay | Admitting: Cardiology

## 2013-09-17 ENCOUNTER — Other Ambulatory Visit: Payer: Self-pay | Admitting: Cardiology

## 2013-11-07 NOTE — Telephone Encounter (Signed)
Close Encounter 

## 2013-11-27 ENCOUNTER — Encounter: Payer: Self-pay | Admitting: Geriatric Medicine

## 2013-11-27 ENCOUNTER — Non-Acute Institutional Stay: Payer: Medicare Other | Admitting: Nurse Practitioner

## 2013-11-27 ENCOUNTER — Encounter: Payer: Self-pay | Admitting: Nurse Practitioner

## 2013-11-27 VITALS — BP 110/82 | HR 60 | Wt 102.0 lb

## 2013-11-27 DIAGNOSIS — R1013 Epigastric pain: Secondary | ICD-10-CM | POA: Diagnosis not present

## 2013-11-27 DIAGNOSIS — G3184 Mild cognitive impairment, so stated: Secondary | ICD-10-CM | POA: Diagnosis not present

## 2013-11-27 DIAGNOSIS — K3189 Other diseases of stomach and duodenum: Secondary | ICD-10-CM

## 2013-11-27 DIAGNOSIS — I10 Essential (primary) hypertension: Secondary | ICD-10-CM | POA: Diagnosis not present

## 2013-11-27 NOTE — Progress Notes (Signed)
Patient ID: Margaret [name], female   DOB: [birth year], 79 y.o.   MRN: [id number]   [facility] [phone])  Code Status: DNR      Contact Information   Name Relation Home Work [place] Daughter [phone]         Chief Complaint  Patient presents with  . Medical Management of Chronic Issues    blood pressure, memory     HPI: This is a 79 y.o. female evaluated today for management of ongoing medical issues. Here today with caregiver. Living at home alone in community (has apartment [facility] at [facility] but chooses not to live there). Has caregivers with her 7 days a week. 7 hours during the week and 4 hours during the weekend. Does not drive. Meals prepared for her and eating well since stomach issue has resolved.  Has gained 2 lbs since the last visit Caregiver reports STML has been stable. No behaviors noted.  Caregivers drives her around to run errands. Goes to the store. Does not cook. Able to preform all ADLs independently. Continent of bowel and bladder.   Allergies  Allergen Reactions  . Sulfa Antibiotics     MEDICATIONS -     Medication List       This list is accurate as of: 11/27/13  2:11 PM.  Always use your most recent med list.               acetaminophen 650 MG CR tablet  Commonly known as:  TYLENOL  Take 650 mg by mouth. Take one as needed     aspirin 81 MG tablet  81 mg. Pt only taking when she remembers.     CALCIUM 1200 PO  Take 1 tablet by mouth daily.     CRESTOR 20 MG tablet  Generic drug:  rosuvastatin  TAKE 1 TABLET BY MOUTH DAILY     galantamine 24 MG 24 hr capsule  Commonly known as:  RAZADYNE ER  Take one capsule by mouth once daily     ranitidine 75 MG tablet  Commonly known as:  PX RANITIDINE  Take 1 tablet (75 mg total) by mouth at bedtime.     valsartan 320 MG tablet  Commonly known as:  DIOVAN  Take 1 tablet (320 mg total) by mouth daily.          Laboratory Studies: Lab Results    Component Value Date   WBC 8.6 06/04/2013   HGB 14.0 06/04/2013   HCT 38.9 06/04/2013   MCV 88 06/04/2013   Lab Results  Component Value Date   NA 141 06/04/2013   K 4.4 06/04/2013   BUN 15 06/04/2013   CREATININE 0.66 06/04/2013   Lab Results  Component Value Date   CALCIUM 9.6 06/04/2013   ALBUMIN 4.3 09/08/2011   AST 20 06/04/2013   ALT 13 06/04/2013   ALKPHOS 43 06/04/2013   BILITOT 0.9 06/04/2013   GFRNONAA 86 06/04/2013   GFRAA 99 06/04/2013   No results found for this basename: vitaminb12,  vitamind     REVIEW OF SYSTEMS  DATA OBTAINED: from patient,  Caregiver GENERAL: Feels well   No recent fever, fatigue, change in appetite or weight SKIN: No itch, rash or open wounds MOUTH/THROAT: No mouth or tooth pain  No sore throat   No difficulty chewing or swallowing RESPIRATORY: No cough, wheezing, SOB CARDIAC: No chest pain, palpitations  No edema. GI: no abdominal discomfort No nausea, vomiting,diarrhea or constipation  No  heartburn or reflux  GU: No dysuria, frequency or urgency  No change in urine volume or character   MUSCULOSKELETAL: No joint pain, swelling, sometimes reports stiffness but this is rare.  No back pain  No muscle ache, pain, weakness  Gait is steady  No recent falls.  NEUROLOGIC: No dizziness, fainting.  No change in mental status (STML).  PSYCHIATRIC: No feelings of anxiety, depression  Sleeps well.       PHYSICAL EXAM Filed Vitals:   11/27/13 1346  BP: 110/82  Pulse: 60  Weight: 102 lb (46.267 kg)   Body mass index is 19.92 kg/(m^2). GENERAL APPEARANCE: No acute distress, appropriately groomed, Thin body habitus. Alert, pleasant, conversant. SKIN: No diaphoresis, rash.  HEAD: Normocephalic, atraumatic EYES: Conjunctiva/lids clear.  GASTROINTESTINAL: Abdomen is soft, non-tender, not distended w/ normal bowel sounds. GENITOURINARY: Bladder non tender, not distended. MUSCULOSKELETAL: Moves all extremities with full ROM, strength and tone. Gait is  steady NEUROLOGIC: Oriented to time, place, person. Speech clear, no tremor. STML  PSYCHIATRIC: Mood and affect appropriate to situation   ASSESSMENT/PLAN  1. HYPERTENSION, UNSPECIFIED -Patients hypertension is stable; continue current regimen. Will monitor and make changes as necessary. -will get bmp 2. Mild cognitive impairment -memory has been stable. Will cont current medications.   3. Dyspepsia -improved on ranitidine, pt eating better without any abdominal complaints -to cont medication at this time

## 2013-12-02 ENCOUNTER — Other Ambulatory Visit: Payer: Medicare Other

## 2013-12-02 DIAGNOSIS — I10 Essential (primary) hypertension: Secondary | ICD-10-CM

## 2013-12-03 ENCOUNTER — Encounter: Payer: Self-pay | Admitting: *Deleted

## 2013-12-03 LAB — COMPREHENSIVE METABOLIC PANEL
A/G RATIO: 2.9 — AB (ref 1.1–2.5)
ALBUMIN: 4.4 g/dL (ref 3.5–4.8)
ALT: 12 IU/L (ref 0–32)
AST: 14 IU/L (ref 0–40)
Alkaline Phosphatase: 47 IU/L (ref 39–117)
BUN / CREAT RATIO: 29 — AB (ref 11–26)
BUN: 19 mg/dL (ref 8–27)
CALCIUM: 9.4 mg/dL (ref 8.7–10.3)
CO2: 22 mmol/L (ref 18–29)
CREATININE: 0.65 mg/dL (ref 0.57–1.00)
Chloride: 105 mmol/L (ref 97–108)
GFR calc Af Amer: 99 mL/min/{1.73_m2} (ref >59)
GFR, EST NON AFRICAN AMERICAN: 86 mL/min/{1.73_m2} (ref >59)
GLOBULIN, TOTAL: 1.5 g/dL (ref 1.5–4.5)
Glucose: 100 mg/dL — ABNORMAL HIGH (ref 65–99)
Potassium: 4.3 mmol/L (ref 3.5–5.2)
Sodium: 141 mmol/L (ref 134–144)
Total Bilirubin: 0.7 mg/dL (ref 0.0–1.2)
Total Protein: 5.9 g/dL — ABNORMAL LOW (ref 6.0–8.5)

## 2014-02-05 ENCOUNTER — Non-Acute Institutional Stay: Payer: Medicare Other | Admitting: Nurse Practitioner

## 2014-02-05 ENCOUNTER — Telehealth: Payer: Self-pay | Admitting: Internal Medicine

## 2014-02-05 ENCOUNTER — Ambulatory Visit
Admission: RE | Admit: 2014-02-05 | Discharge: 2014-02-05 | Disposition: A | Discharge status: Home / Self Care | Payer: Medicare Other | Source: Ambulatory Visit | Attending: Nurse Practitioner | Admitting: Nurse Practitioner

## 2014-02-05 ENCOUNTER — Encounter: Payer: Self-pay | Admitting: Nurse Practitioner

## 2014-02-05 VITALS — BP 100/68 | HR 77 | Temp 98.0°F | Wt 98.0 lb

## 2014-02-05 DIAGNOSIS — R1030 Lower abdominal pain, unspecified: Secondary | ICD-10-CM | POA: Diagnosis not present

## 2014-02-05 DIAGNOSIS — R109 Unspecified abdominal pain: Secondary | ICD-10-CM

## 2014-02-05 DIAGNOSIS — R630 Anorexia: Secondary | ICD-10-CM | POA: Diagnosis not present

## 2014-02-05 NOTE — Progress Notes (Signed)
Nursing Home Location:  [place] of Service: Clinic (12)  PCP: Estill Dooms, MD  Allergies  Allergen Reactions  . Sulfa Antibiotics     Chief Complaint  Patient presents with  . Abdominal Pain    increase frequency of gurgling in abomin in past 3 days. With [facility]    HPI:  This is a 79 y.o. female evaluated today for gurgling in her stomach. Here today with caregiver who is providing the history. Pt reports there is no issues. Caregiver reports a rumbling in her stomach throughout the day and a pain in her side that has been on and off for about a month. Not having abdominal pain. No stomach pain. Caregiver has not seen any bowel movements. Pt reports 1-2 formed BMs a day. Denies diarrhea no nausea. Reports she has just not been hungry and uncomfortable in her side and does not feel like there is an issue. Caregiver feels like she is constipated but pt is admit that she is not.     Review of Systems:  Review of Systems  Constitutional: Negative for activity change, appetite change and fatigue.  HENT: Positive for hearing loss. Negative for congestion.   Eyes: Negative.   Respiratory: Negative for cough and shortness of breath.   Cardiovascular: Negative for chest pain, palpitations and leg swelling.  Gastrointestinal: Negative for nausea, vomiting, abdominal pain, diarrhea, constipation, blood in stool, abdominal distention and rectal pain.       See HPI  Genitourinary: Negative for difficulty urinating.  Musculoskeletal: Negative for arthralgias and myalgias.  Skin: Negative for color change and wound.  Neurological: Negative for dizziness and weakness.  Psychiatric/Behavioral: Positive for confusion. Negative for behavioral problems and agitation.    Past Medical History  Diagnosis Date  . HTN (hypertension)     unspecified  . Hyperlipidemia     mixed  . Osteopenia   . Lichen sclerosus   . BRUIT   . Disturbances of  sensation of smell and taste   . Lipoma of face   . Other persistent mental disorders due to conditions classified elsewhere   . Memory loss    Past Surgical History  Procedure Laterality Date  . Tonsilectomy, adenoidectomy, bilateral myringotomy and tubes    . Oophorectomy  1988    DIAG LAP W BSO  . Cataract extraction, bilateral     Social History:   reports that she quit smoking about 11 years ago. She has never used smokeless tobacco. She reports that she drinks about 7.7 ounces of alcohol per week. She reports that she does not use illicit drugs.  Family History  Problem Relation Age of Onset  . Cancer Mother     OVARIAN  . Diabetes Father   . Hypertension Father     Medications: Patient's Medications  New Prescriptions   No medications on file  Previous Medications   ACETAMINOPHEN (TYLENOL) 650 MG CR TABLET    Take 650 mg by mouth. Take one as needed   ASPIRIN 81 MG TABLET    81 mg. Pt only taking when she remembers.   CALCIUM CARBONATE-VIT D-MIN (CALCIUM 1200 PO)    Take 1 tablet by mouth daily.    CRESTOR 20 MG TABLET    TAKE 1 TABLET BY MOUTH DAILY   GALANTAMINE (RAZADYNE ER) 24 MG 24 HR CAPSULE    Take one capsule by mouth once daily   RANITIDINE (PX RANITIDINE) 75 MG TABLET  Take 1 tablet (75 mg total) by mouth at bedtime.   VALSARTAN (DIOVAN) 320 MG TABLET    Take 1 tablet (320 mg total) by mouth daily.  Modified Medications   No medications on file  Discontinued Medications   No medications on file     Physical Exam: Filed Vitals:   02/05/14 1352  BP: 100/68  Pulse: 77  Temp: 98 F (36.7 C)  TempSrc: Oral  Weight: 98 lb (44.453 kg)  SpO2: 91%    Physical Exam  Constitutional: No distress.  Thin frail elderly female  HENT:  Head: Normocephalic and atraumatic.  Right Ear: External ear normal.  Left Ear: External ear normal.  Nose: Nose normal.  Eyes: Conjunctivae and EOM are normal.  Neck: Normal range of motion. Neck supple. No JVD present.  No tracheal deviation present. No thyromegaly present.  Cardiovascular: Normal rate, regular rhythm, normal heart sounds and intact distal pulses.  Exam reveals no gallop and no friction rub.   No murmur heard. Pulmonary/Chest: Effort normal and breath sounds normal.  Abdominal: Soft. Bowel sounds are normal. She exhibits no distension and no mass. There is no tenderness. There is no rebound and no guarding.  Soft stool in rectal vault  Genitourinary: Guaiac negative stool.  Musculoskeletal: Normal range of motion. She exhibits no edema.  Lymphadenopathy:    She has no cervical adenopathy.  Neurological: She is alert. No cranial nerve deficit. Coordination normal.  Psychiatric: She has a normal mood and affect. Her behavior is normal. Thought content normal.    Labs reviewed: Basic Metabolic Panel:  Recent Labs  06/04/13 0952 12/02/13 1057  NA 141 141  K 4.4 4.3  CL 103 105  CO2 22 22  GLUCOSE 104* 100*  BUN 15 19  CREATININE 0.66 0.65  CALCIUM 9.6 9.4   Liver Function Tests:  Recent Labs  06/04/13 0952 12/02/13 1057  AST 20 14  ALT 13 12  ALKPHOS 43 47  BILITOT 0.9 0.7  PROT 6.3 5.9*   No results found for this basename: LIPASE, AMYLASE,  in the last 8760 hours No results found for this basename: AMMONIA,  in the last 8760 hours CBC:  Recent Labs  06/04/13 0952  WBC 8.6  NEUTROABS 5.3  HGB 14.0  HCT 38.9  MCV 88   TSH:  Recent Labs  06/04/13 0952  TSH 1.040   A1C: No results found for this basename: HGBA1C   Lipid Panel:  Recent Labs  06/04/13 0952  HDL 93  LDLCALC 99  TRIG 70  CHOLHDL 2.2    Assessment/Plan 1. Abdominal discomfort Pt with noted weight loss and pt has not been eating -pt reports there is not an issue and is admit that she is not constipated nor having gas pains however she is a poor historian -will get abdominal xray today to further evaluation  - caregiver to monitor BMs and encourage PO intake -to increase water  intake at this time - to cont to monitor and return as needed

## 2014-02-05 NOTE — Telephone Encounter (Signed)
Opened in error

## 2014-02-05 NOTE — Telephone Encounter (Signed)
Pt care giver call for an appointment. Pt is a resident of [facility] per Ebony Hail-  Says has a home and she has a resident at [facility]  Called pt to schedule the appointment and apologize for the miscommunications..[email or web address].[email or web address] Kitchen

## 2014-02-06 ENCOUNTER — Telehealth: Payer: Self-pay

## 2014-02-06 NOTE — Telephone Encounter (Signed)
Called patient with x-ray results, normal, just showed gas. Just watch bowel movements, make sure they are normal, per [name]. Asked patient if she was eating foods that cause gas, not that she is aware of. Leda Gauze the Care Giver called back to ask if drinking a lot of wine could cause gas and not eating her dinner. Spoke with [name], she suggested that Leda Gauze only let her have one glass of wine at night, take the bottle away, until we can figure out what is causing the abdominal pain. Leda Gauze will do what she can.

## 2014-02-24 ENCOUNTER — Encounter: Payer: Self-pay | Admitting: Nurse Practitioner

## 2014-04-02 NOTE — Progress Notes (Signed)
HPI: FU hypertension and hyperlipidemia. An echocardiogram performed in November of 2008 showed normal LV function. There was trivial aortic insufficiency. Since I last saw her, the patient denies any dyspnea on exertion, orthopnea, PND, pedal edema, palpitations, syncope or chest pain.   Current Outpatient Prescriptions  Medication Sig Dispense Refill  . acetaminophen (TYLENOL) 650 MG CR tablet Take 650 mg by mouth. Take one as needed    . aspirin 81 MG tablet 81 mg. Pt only taking when she remembers.    . Calcium Carbonate-Vit D-Min (CALCIUM 1200 PO) Take 1 tablet by mouth daily.     . CRESTOR 20 MG tablet TAKE 1 TABLET BY MOUTH DAILY 30 tablet 5  . galantamine (RAZADYNE ER) 24 MG 24 hr capsule Take one capsule by mouth once daily 30 capsule 5  . ranitidine (PX RANITIDINE) 75 MG tablet Take 1 tablet (75 mg total) by mouth at bedtime.    . valsartan (DIOVAN) 320 MG tablet Take 1 tablet (320 mg total) by mouth daily. 30 tablet 1   No current facility-administered medications for this visit.     Past Medical History  Diagnosis Date  . HTN (hypertension)     unspecified  . Hyperlipidemia     mixed  . Osteopenia   . Lichen sclerosus   . BRUIT   . Disturbances of sensation of smell and taste   . Lipoma of face   . Other persistent mental disorders due to conditions classified elsewhere   . Memory loss     Past Surgical History  Procedure Laterality Date  . Tonsilectomy, adenoidectomy, bilateral myringotomy and tubes    . Oophorectomy  1988    DIAG LAP W BSO  . Cataract extraction, bilateral      History   Social History  . Marital Status: Married    Spouse Name: N/A    Number of Children: N/A  . Years of Education: N/A   Occupational History  .      full time   Social History Main Topics  . Smoking status: Former Smoker    Quit date: 09/13/2002  . Smokeless tobacco: Never Used  . Alcohol Use: 7.7 oz/week    7 Not specified, 7 Glasses of wine per week  .  Drug Use: No  . Sexual Activity: No   Other Topics Concern  . Not on file   Social History Narrative   This patient is widowed. She lives alone  in a single level  home in [place], [place] to assist with meals and transportation.    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   Stopped smoking 20 years ago, drinks 1-2 glasses of wine a few days a week.    Patient has 2 sons and one daughter, all live out of town.   Has a Living Will, DO NOT RESUSCITATE.    ROS: no fevers or chills, productive cough, hemoptysis, dysphasia, odynophagia, melena, hematochezia, dysuria, hematuria, rash, seizure activity, orthopnea, PND, pedal edema, claudication. Remaining systems are negative.  Physical Exam: Well-developed well-nourished in no acute distress.  Skin is warm and dry.  HEENT is normal.  Neck is supple.  Chest is clear to auscultation with normal expansion.  Cardiovascular exam is regular rate and rhythm. 2/6 systolic murmur left sternal border. Abdominal exam nontender or distended. No masses palpated. Extremities show no edema. neuro grossly intact  ECG sinus rhythm at  a rate of 67. Nonspecific ST changes.

## 2014-04-07 ENCOUNTER — Encounter: Payer: Self-pay | Admitting: Cardiology

## 2014-04-07 ENCOUNTER — Ambulatory Visit (INDEPENDENT_AMBULATORY_CARE_PROVIDER_SITE_OTHER): Payer: Medicare Other | Admitting: Cardiology

## 2014-04-07 VITALS — BP 128/80 | HR 67 | Ht 60.0 in | Wt 105.0 lb

## 2014-04-07 DIAGNOSIS — E785 Hyperlipidemia, unspecified: Secondary | ICD-10-CM

## 2014-04-07 DIAGNOSIS — I10 Essential (primary) hypertension: Secondary | ICD-10-CM

## 2014-04-07 LAB — LIPID PANEL
Cholesterol: 247 mg/dL — ABNORMAL HIGH (ref 0–200)
HDL: 78 mg/dL (ref >39)
LDL Cholesterol: 152 mg/dL — ABNORMAL HIGH (ref 0–99)
Total CHOL/HDL Ratio: 3.2 Ratio
Triglycerides: 83 mg/dL (ref <150)
VLDL: 17 mg/dL (ref 0–40)

## 2014-04-07 LAB — BASIC METABOLIC PANEL WITH GFR
BUN: 20 mg/dL (ref 6–23)
CO2: 28 mEq/L (ref 19–32)
Calcium: 9.5 mg/dL (ref 8.4–10.5)
Chloride: 105 mEq/L (ref 96–112)
Creat: 0.62 mg/dL (ref 0.50–1.10)
GFR, Est African American: >89 mL/min
GFR, Est Non African American: 87 mL/min
GLUCOSE: 72 mg/dL (ref 70–99)
POTASSIUM: 4 meq/L (ref 3.5–5.3)
Sodium: 141 mEq/L (ref 135–145)

## 2014-04-07 LAB — HEPATIC FUNCTION PANEL
ALBUMIN: 4.1 g/dL (ref 3.5–5.2)
ALT: 10 U/L (ref 0–35)
AST: 15 U/L (ref 0–37)
Alkaline Phosphatase: 39 U/L (ref 39–117)
Bilirubin, Direct: 0.1 mg/dL (ref 0.0–0.3)
Indirect Bilirubin: 0.6 mg/dL (ref 0.2–1.2)
Total Bilirubin: 0.7 mg/dL (ref 0.2–1.2)
Total Protein: 6.1 g/dL (ref 6.0–8.3)

## 2014-04-07 NOTE — Assessment & Plan Note (Signed)
Blood pressure controlled. Continue present medications. Check potassium and renal function. 

## 2014-04-07 NOTE — Patient Instructions (Signed)
Your physician wants you to follow-up in: ONE YEAR WITH DR CRENSHAW You will receive a reminder letter in the mail two months in advance. If you don't receive a letter, please call our office to schedule the follow-up appointment.   Your physician recommends that you HAVE LAB WORK TODAY 

## 2014-04-07 NOTE — Assessment & Plan Note (Signed)
Continue statin. Check lipids and liver. 

## 2014-04-08 ENCOUNTER — Other Ambulatory Visit: Payer: Self-pay | Admitting: *Deleted

## 2014-04-08 DIAGNOSIS — E785 Hyperlipidemia, unspecified: Secondary | ICD-10-CM

## 2014-04-08 MED ORDER — ROSUVASTATIN CALCIUM 40 MG PO TABS
40.0000 mg | ORAL_TABLET | Freq: Every day | ORAL | Status: DC
Start: 2014-04-08 — End: 2015-04-30

## 2014-05-20 ENCOUNTER — Telehealth: Payer: Self-pay | Admitting: Cardiology

## 2014-05-20 NOTE — Telephone Encounter (Signed)
Pt's caregiver called requesting [place] to fax lab orders to [facility]. Pt has upcoming labs already scheduled there. Faxed requisition sheet, requested results to be sent to [place] upon completion.

## 2014-05-20 NOTE — Telephone Encounter (Signed)
Margaret [name] was calling in stating that the pt will have some fasting labs done on 2/8 at [facility] and wanted to see if Dr. Stanford Breed could fax her lab orders to the facility to avoid the pt being stuck twice. Please call back  Thanks

## 2014-05-21 ENCOUNTER — Telehealth: Payer: Self-pay

## 2014-05-21 DIAGNOSIS — R1084 Generalized abdominal pain: Secondary | ICD-10-CM

## 2014-05-21 DIAGNOSIS — E785 Hyperlipidemia, unspecified: Secondary | ICD-10-CM

## 2014-05-21 DIAGNOSIS — I10 Essential (primary) hypertension: Secondary | ICD-10-CM

## 2014-05-21 NOTE — Telephone Encounter (Signed)
Margaret [name] called patient wants to come to office for labs, has appt with Dr. Mariea Clonts 06/03/14.

## 2014-06-02 ENCOUNTER — Encounter: Payer: Self-pay | Admitting: Internal Medicine

## 2014-06-02 ENCOUNTER — Other Ambulatory Visit: Payer: Self-pay | Admitting: *Deleted

## 2014-06-02 ENCOUNTER — Other Ambulatory Visit: Payer: Medicare Other

## 2014-06-02 DIAGNOSIS — R1084 Generalized abdominal pain: Secondary | ICD-10-CM | POA: Diagnosis not present

## 2014-06-02 DIAGNOSIS — I10 Essential (primary) hypertension: Secondary | ICD-10-CM

## 2014-06-02 DIAGNOSIS — E785 Hyperlipidemia, unspecified: Secondary | ICD-10-CM

## 2014-06-03 ENCOUNTER — Encounter: Payer: Self-pay | Admitting: Internal Medicine

## 2014-06-03 ENCOUNTER — Non-Acute Institutional Stay: Payer: Medicare Other | Admitting: Internal Medicine

## 2014-06-03 VITALS — BP 110/60 | HR 80 | Temp 97.8°F | Wt 99.0 lb

## 2014-06-03 DIAGNOSIS — F03918 Unspecified dementia, unspecified severity, with other behavioral disturbance: Secondary | ICD-10-CM

## 2014-06-03 DIAGNOSIS — E785 Hyperlipidemia, unspecified: Secondary | ICD-10-CM

## 2014-06-03 DIAGNOSIS — F0391 Unspecified dementia with behavioral disturbance: Secondary | ICD-10-CM | POA: Diagnosis not present

## 2014-06-03 DIAGNOSIS — I10 Essential (primary) hypertension: Secondary | ICD-10-CM | POA: Diagnosis not present

## 2014-06-03 LAB — COMPREHENSIVE METABOLIC PANEL
ALT: 17 IU/L (ref 0–32)
AST: 20 IU/L (ref 0–40)
Albumin/Globulin Ratio: 2.2 (ref 1.1–2.5)
Albumin: 4.6 g/dL (ref 3.5–4.8)
Alkaline Phosphatase: 45 IU/L (ref 39–117)
BUN/Creatinine Ratio: 23 (ref 11–26)
BUN: 15 mg/dL (ref 8–27)
Bilirubin Total: 1.3 mg/dL — ABNORMAL HIGH (ref 0.0–1.2)
CHLORIDE: 102 mmol/L (ref 97–108)
CO2: 18 mmol/L (ref 18–29)
Calcium: 9.8 mg/dL (ref 8.7–10.3)
Creatinine, Ser: 0.65 mg/dL (ref 0.57–1.00)
GFR calc non Af Amer: 86 mL/min/{1.73_m2} (ref >59)
GFR, EST AFRICAN AMERICAN: 99 mL/min/{1.73_m2} (ref >59)
GLOBULIN, TOTAL: 2.1 g/dL (ref 1.5–4.5)
Glucose: 94 mg/dL (ref 65–99)
Potassium: 4.2 mmol/L (ref 3.5–5.2)
Sodium: 140 mmol/L (ref 134–144)
TOTAL PROTEIN: 6.7 g/dL (ref 6.0–8.5)

## 2014-06-03 LAB — CBC WITH DIFFERENTIAL/PLATELET
Basophils Absolute: 0 10*3/uL (ref 0.0–0.2)
Basos: 1 %
EOS: 2 %
Eosinophils Absolute: 0.1 10*3/uL (ref 0.0–0.4)
HCT: 42.7 % (ref 34.0–46.6)
HEMOGLOBIN: 14.3 g/dL (ref 11.1–15.9)
IMMATURE GRANS (ABS): 0 10*3/uL (ref 0.0–0.1)
Immature Granulocytes: 0 %
LYMPHS: 29 %
Lymphocytes Absolute: 2 10*3/uL (ref 0.7–3.1)
MCH: 32.1 pg (ref 26.6–33.0)
MCHC: 33.5 g/dL (ref 31.5–35.7)
MCV: 96 fL (ref 79–97)
MONOS ABS: 0.5 10*3/uL (ref 0.1–0.9)
Monocytes: 7 %
NEUTROS PCT: 61 %
Neutrophils Absolute: 4.3 10*3/uL (ref 1.4–7.0)
Platelets: 369 10*3/uL (ref 150–379)
RBC: 4.45 x10E6/uL (ref 3.77–5.28)
RDW: 14.6 % (ref 12.3–15.4)
WBC: 7 10*3/uL (ref 3.4–10.8)

## 2014-06-03 LAB — LIPID PANEL
CHOL/HDL RATIO: 3.3 ratio (ref 0.0–4.4)
Cholesterol, Total: 294 mg/dL — ABNORMAL HIGH (ref 100–199)
HDL: 89 mg/dL (ref >39)
LDL Calculated: 179 mg/dL — ABNORMAL HIGH (ref 0–99)
Triglycerides: 132 mg/dL (ref 0–149)
VLDL Cholesterol Cal: 26 mg/dL (ref 5–40)

## 2014-06-03 LAB — HEPATIC FUNCTION PANEL
ALT: 14 IU/L (ref 0–32)
AST: 22 IU/L (ref 0–40)
Albumin: 4.6 g/dL (ref 3.5–4.8)
Alkaline Phosphatase: 46 IU/L (ref 39–117)
Bilirubin Total: 1.2 mg/dL (ref 0.0–1.2)
Bilirubin, Direct: 0.24 mg/dL (ref 0.00–0.40)
TOTAL PROTEIN: 6.6 g/dL (ref 6.0–8.5)

## 2014-06-03 MED ORDER — MEMANTINE HCL ER 7 & 14 & 21 &28 MG PO CP24: 1.0000 | ORAL_CAPSULE | Freq: Every day | ORAL | Status: DC

## 2014-06-03 NOTE — Progress Notes (Signed)
Patient ID: Margaret [name], female   DOB: 1935/03/19, 80 y.o.   MRN: [id number]   Location: [facility]  Code Status: DNR  Allergies  Allergen Reactions  . Sulfa Antibiotics     Chief Complaint  Patient presents with  . Medical Management of Chronic Issues    blood pressure, memory     HPI: Patient is a 80 y.o. white female seen in the office today for med mgt of chronic diseases.    She [place] a home off campus.   She has no concerns.    Lipids remain elevated, but has good HDL.  Is already on crestor 42m.  Has not really made dietary changes.  Walks every day.    Blood pressure at goal.  No dizziness, lightheadedness.  No falls.    Pt feels her memory is doing pretty good.    Denies any pain whatsoever.    Mood is good, also.  Sleeps well at night.  Goes out with friends to socialize.    Only exercise is walking--takes her toy poodle out.    Her caregiver brought her for the labs.  Cortney is here with her today.  She helps set up her meals.  Doesn't eat regularly at the meals.  Had not taken her meds today.  Leaving bottle open isn't helping.  Unclear if she showered this am or not.  She got very agitated and angry saying she wasn't going to today.  Asks a lot of questions again and again.  I met with Cortney for 15 minutes in addition to the time with the patient.  Her daughter is involved some in her care.    Review of Systems:  Review of Systems  Constitutional: Negative for fever and malaise/fatigue.  HENT: Negative for hearing loss.   Eyes: Negative for blurred vision.  Respiratory: Negative for shortness of breath.   Cardiovascular: Negative for chest pain.  Gastrointestinal: Negative for abdominal pain.  Genitourinary: Negative for dysuria.  Musculoskeletal: Negative for falls.  Neurological: Negative for dizziness and weakness.  Psychiatric/Behavioral: Positive for memory loss. Negative for depression.     Past Medical History  Diagnosis  Date  . HTN (hypertension)     unspecified  . Hyperlipidemia     mixed  . Osteopenia   . Lichen sclerosus   . BRUIT   . Disturbances of sensation of smell and taste   . Lipoma of face   . Other persistent mental disorders due to conditions classified elsewhere   . Memory loss     Past Surgical History  Procedure Laterality Date  . Tonsilectomy, adenoidectomy, bilateral myringotomy and tubes    . Oophorectomy  1988    DIAG LAP W BSO  . Cataract extraction, bilateral      Social History:   reports that she quit smoking about 11 years ago. She has never used smokeless tobacco. She reports that she drinks about 7.7 oz of alcohol per week. She reports that she does not use illicit drugs.  Family History  Problem Relation Age of Onset  . Cancer Mother     OVARIAN  . Diabetes Father   . Hypertension Father     Medications: Patient's Medications  New Prescriptions   No medications on file  Previous Medications   ACETAMINOPHEN (TYLENOL) 650 MG CR TABLET    Take 650 mg by mouth. Take one as needed   ASPIRIN 81 MG TABLET    81 mg. Pt only taking when she  remembers.   CALCIUM CARBONATE-VIT D-MIN (CALCIUM 1200 PO)    Take 1 tablet by mouth daily.    GALANTAMINE (RAZADYNE ER) 24 MG 24 HR CAPSULE    Take one capsule by mouth once daily   RANITIDINE (PX RANITIDINE) 75 MG TABLET    Take 1 tablet (75 mg total) by mouth at bedtime.   ROSUVASTATIN (CRESTOR) 40 MG TABLET    Take 1 tablet (40 mg total) by mouth daily.   VALSARTAN (DIOVAN) 320 MG TABLET    Take 1 tablet (320 mg total) by mouth daily.  Modified Medications   No medications on file  Discontinued Medications   No medications on file     Physical Exam: Filed Vitals:   06/03/14 1402  BP: 110/60  Pulse: 80  Temp: 97.8 F (36.6 C)  TempSrc: Oral  Weight: 99 lb (44.906 kg)  Physical Exam  Cardiovascular: Normal rate, regular rhythm and intact distal pulses.   Murmur heard. 2/6 systolic murmur  Pulmonary/Chest:  Effort normal and breath sounds normal. No respiratory distress.  Musculoskeletal: Normal range of motion.  Neurological: She is alert. She displays normal reflexes.  Oriented to person and place, not time and does not recall short term events  Skin: Skin is warm and dry.  Psychiatric: She has a normal mood and affect.     Labs reviewed: Basic Metabolic Panel:  Recent Labs  06/04/13 0952 12/02/13 1057 04/07/14 1408 06/02/14 0926  NA 141 141 141 140  K 4.4 4.3 4.0 4.2  CL 103 105 105 102  CO2 _0 GLUCOSE 104* 100* 72 94  BUN _1 CREATININE 0.66 0.65 0.62 0.65  CALCIUM 9.6 9.4 9.5 9.8  TSH 1.040  --   --   --    Liver Function Tests:  Recent Labs  06/04/13 0952 12/02/13 1057 04/07/14 1408 06/02/14 0926 06/02/14 0932  AST _2 ALT _3 ALKPHOS 43 47 39 45 46  BILITOT 0.9 0.7 0.7  --   --   PROT 6.3 5.9* 6.1 6.7 6.6  ALBUMIN  --   --  4.1  --   --    No results for input(s): LIPASE, AMYLASE in the last 8760 hours. No results for input(s): AMMONIA in the last 8760 hours. CBC:  Recent Labs  06/04/13 0952 06/02/14 0926  WBC 8.6 7.0  NEUTROABS 5.3 4.3  HGB 14.0 14.3  HCT 38.9 42.7  MCV 88 96  PLT  --  369   Lipid Panel:  Recent Labs  06/04/13 0952 04/07/14 1408 06/02/14 0926  CHOL  --  247*  --   HDL 93 78 89  LDLCALC 99 152* 179*  TRIG 70 83 132  CHOLHDL 2.2 3.2 3.3    Assessment/Plan 1. Dementia with behavioral disturbance -cont razadyne, start namenda XR titration pack--explained to her caregiver that the namenda can be opened and sprinkled in her soup -seems pt will be needing more regular and consistent caregivers due to increased need for reminders to do adl care - Memantine HCl ER 7 & 14 & 21 &28 MG CP24; Take 1 capsule by mouth daily.  Dispense: 28 capsule; Refill: 0  2. Benign essential hypertension -bp at goal with current regimen  3. Hyperlipemia -cont crestor 28m -LDL not at goal of less  than 100, but risks of increased crestor dose at her age outweigh benefits -also has protective HDL  Labs/tests ordered:  No new Next appt:  1 month with Jessica--f/u on memory   L. , D.O. [facility] [address], [place] [address] Cell Phone (Mon-Fri 8am-5pm):  [phone] On Call:  [phone] & follow prompts after 5pm & weekends Office Phone:  [phone] Office Fax:  [phone]

## 2014-06-23 ENCOUNTER — Other Ambulatory Visit: Payer: Self-pay | Admitting: *Deleted

## 2014-06-23 ENCOUNTER — Other Ambulatory Visit: Payer: Self-pay | Admitting: Cardiology

## 2014-06-23 MED ORDER — VALSARTAN 320 MG PO TABS: 320.0000 mg | ORAL_TABLET | Freq: Every day | ORAL | Status: DC

## 2014-06-23 NOTE — Telephone Encounter (Signed)
Rx has been sent to the pharmacy electronically. ° °

## 2014-07-02 ENCOUNTER — Encounter: Payer: Self-pay | Admitting: Nurse Practitioner

## 2014-07-02 ENCOUNTER — Non-Acute Institutional Stay: Payer: Medicare Other | Admitting: Nurse Practitioner

## 2014-07-02 VITALS — BP 100/62 | HR 64 | Wt 100.0 lb

## 2014-07-02 DIAGNOSIS — F0391 Unspecified dementia with behavioral disturbance: Secondary | ICD-10-CM | POA: Diagnosis not present

## 2014-07-02 DIAGNOSIS — I10 Essential (primary) hypertension: Secondary | ICD-10-CM | POA: Diagnosis not present

## 2014-07-02 DIAGNOSIS — F03918 Unspecified dementia, unspecified severity, with other behavioral disturbance: Secondary | ICD-10-CM

## 2014-07-02 MED ORDER — VALSARTAN 160 MG PO TABS: 160.0000 mg | ORAL_TABLET | Freq: Every day | ORAL | Status: DC

## 2014-07-02 MED ORDER — GALANTAMINE HYDROBROMIDE ER 24 MG PO CP24: ORAL_CAPSULE | ORAL | Status: DC

## 2014-07-02 MED ORDER — MEMANTINE HCL ER 28 MG PO CP24: 28.0000 mg | ORAL_CAPSULE | Freq: Every day | ORAL | Status: DC

## 2014-07-02 NOTE — Addendum Note (Signed)
Addended by: Lauree Chandler on: 07/02/2014 03:55 PM   Modules accepted: Orders, Medications

## 2014-07-02 NOTE — Progress Notes (Signed)
Patient ID: Margaret [name], female   DOB: 1934/07/09, 80 y.o.   MRN: [id number]    Nursing Home Location:  [place] of Service: Clinic (12)  PCP: REED, TIFFANY, DO  Allergies  Allergen Reactions  . Sulfa Antibiotics     Chief Complaint  Patient presents with  . Medical Management of Chronic Issues    memory after starting Namenda    HPI:  Patient is a 80 y.o. female seen today at [facility] for follow up on her memory issues.  Last visit, one month ago, patient was agitated and repeating questions.  Namenda was added to her medications to help with behaviors.  Today, the patient has no complaints and reports that she is eating well, not feeling agitated.   Caregiver, Leda Gauze,  states that the patient will get nauseous and throw up (about twice monthly) but will not complain about this.  Also, she will not eat some days for 24 hours at a time due to the patient's stomach gurgling and the sound is audible to the caregiver.  She does drink wine nightly.    Review of Systems:  Review of Systems  Constitutional: Negative for chills, activity change and fatigue.  HENT: Negative.   Respiratory: Negative for cough, shortness of breath and wheezing.   Cardiovascular: Negative for chest pain, palpitations and leg swelling.  Gastrointestinal: Negative for nausea, abdominal pain, diarrhea and constipation.  Musculoskeletal: Negative for gait problem and neck pain.  Skin: Negative for color change, pallor, rash and wound.  Neurological: Negative for dizziness, weakness and headaches.    Past Medical History  Diagnosis Date  . HTN (hypertension)     unspecified  . Hyperlipidemia     mixed  . Osteopenia   . Lichen sclerosus   . BRUIT   . Disturbances of sensation of smell and taste   . Lipoma of face   . Other persistent mental disorders due to conditions classified elsewhere   . Memory loss    Past Surgical History  Procedure  Laterality Date  . Tonsilectomy, adenoidectomy, bilateral myringotomy and tubes    . Oophorectomy  1988    DIAG LAP W BSO  . Cataract extraction, bilateral     Social History:   reports that she quit smoking about 11 years ago. She has never used smokeless tobacco. She reports that she drinks about 7.7 oz of alcohol per week. She reports that she does not use illicit drugs.  Family History  Problem Relation Age of Onset  . Cancer Mother     OVARIAN  . Diabetes Father   . Hypertension Father     Medications: Patient's Medications  New Prescriptions   No medications on file  Previous Medications   ACETAMINOPHEN (TYLENOL) 650 MG CR TABLET    Take 650 mg by mouth. Take one as needed   ASPIRIN 81 MG TABLET    81 mg. Pt only taking when she remembers.   CALCIUM CARBONATE-VIT D-MIN (CALCIUM 1200 PO)    Take 1 tablet by mouth daily.    GALANTAMINE (RAZADYNE ER) 24 MG 24 HR CAPSULE    Take one capsule by mouth once daily   MEMANTINE HCL ER 7 & 14 & 21 &28 MG CP24    Take 1 capsule by mouth daily.   RANITIDINE (PX RANITIDINE) 75 MG TABLET    Take 1 tablet (75 mg total) by mouth at bedtime.   ROSUVASTATIN (CRESTOR) 40 MG TABLET  Take 1 tablet (40 mg total) by mouth daily.  Modified Medications   Modified Medication Previous Medication   VALSARTAN (DIOVAN) 160 MG TABLET valsartan (DIOVAN) 320 MG tablet      Take 1 tablet (160 mg total) by mouth daily.    Take 1 tablet (320 mg total) by mouth daily.  Discontinued Medications   No medications on file     Physical Exam: Filed Vitals:   07/02/14 1337  BP: 100/62  Pulse: 64  Weight: 100 lb (45.36 kg)    Physical Exam  Constitutional: She appears well-developed and well-nourished.  HENT:  Head: Normocephalic and atraumatic.  Neck: Normal range of motion. Neck supple.  Cardiovascular: Normal rate and regular rhythm.   Murmur heard. Pulmonary/Chest: Effort normal and breath sounds normal. She has no wheezes.  Abdominal: Soft.  Bowel sounds are normal. She exhibits no distension. There is no tenderness. There is no rebound.  Musculoskeletal: Normal range of motion.  Neurological: She is alert.  Skin: Skin is warm and dry.    Labs reviewed: Basic Metabolic Panel:  Recent Labs  12/02/13 1057 04/07/14 1408 06/02/14 0926  NA 141 141 140  K 4.3 4.0 4.2  CL 105 105 102  CO2 22 28 18   GLUCOSE 100* 72 94  BUN 19 20 15   CREATININE 0.65 0.62 0.65  CALCIUM 9.4 9.5 9.8   Liver Function Tests:  Recent Labs  04/07/14 1408 06/02/14 0926 06/02/14 0932  AST 15 20 22   ALT 10 17 14   ALKPHOS 39 45 46  BILITOT 0.7 1.3* 1.2  PROT 6.1 6.7 6.6  ALBUMIN 4.1  --   --    No results for input(s): LIPASE, AMYLASE in the last 8760 hours. No results for input(s): AMMONIA in the last 8760 hours. CBC:  Recent Labs  06/02/14 0926  WBC 7.0  NEUTROABS 4.3  HGB 14.3  HCT 42.7  MCV 96  PLT 369   TSH: No results for input(s): TSH in the last 8760 hours. A1C: No results found for: HGBA1C Lipid Panel:  Recent Labs  04/07/14 1408 06/02/14 0926  CHOL 247* 294*  HDL 78 89  LDLCALC 152* 179*  TRIG 83 132  CHOLHDL 3.2 3.3     Assessment/Plan 1. Dementia with behaviors  Tolerating namenda to Continue the Namenda and galantamine.  Weight stable.   2. Hypertension A review of her blood pressures shows systolic values around 782.  Will decrease Diovan to 160mg  po daily. Instructed home care to take blood pressures twice a week and notify us if systolic pressures are above 140. To bring record to follow up appt   Follow up in 6 weeks.

## 2014-07-24 ENCOUNTER — Telehealth: Payer: Self-pay | Admitting: *Deleted

## 2014-07-24 NOTE — Telephone Encounter (Signed)
Patient Caregiver Notified and agreed.

## 2014-07-24 NOTE — Telephone Encounter (Signed)
Margaret [name], patient caregiver called and stated that [name] wanted to have them record patient BP Reading and today it was extremely high 180/110. And it has been running between 110-130/70-72. Caregiver wanted to know if anything should be changed and if they should be checking the BP more than twice a week, if so needs a written order to [facility] to place on Care Plan. Please Advise.

## 2014-07-24 NOTE — Telephone Encounter (Signed)
If blood pressure is elevated may recheck in 1 hour to ensure it goes down.  Make sure pt has taken medication 1 hour prior to checking blood pressure, in the seated position for ~5 mins before checking blood pressure. If blood pressure remains elevated will need follow up appt. If she has an occasional high without other symptoms this is okay as long as it comes down.

## 2014-08-13 ENCOUNTER — Encounter: Payer: Self-pay | Admitting: Nurse Practitioner

## 2014-08-13 ENCOUNTER — Non-Acute Institutional Stay: Payer: Medicare Other | Admitting: Nurse Practitioner

## 2014-08-13 VITALS — BP 114/76 | HR 60 | Temp 97.9°F | Wt 98.0 lb

## 2014-08-13 DIAGNOSIS — E785 Hyperlipidemia, unspecified: Secondary | ICD-10-CM

## 2014-08-13 DIAGNOSIS — F0391 Unspecified dementia with behavioral disturbance: Secondary | ICD-10-CM | POA: Diagnosis not present

## 2014-08-13 DIAGNOSIS — I10 Essential (primary) hypertension: Secondary | ICD-10-CM | POA: Diagnosis not present

## 2014-08-13 DIAGNOSIS — F03918 Unspecified dementia, unspecified severity, with other behavioral disturbance: Secondary | ICD-10-CM

## 2014-08-13 NOTE — Progress Notes (Signed)
Patient ID: Margaret [name], female   DOB: 19-Mar-1935, 80 y.o.   MRN: [id number]    Nursing Home Location:  [place] of Service: Clinic (12)  PCP: REED, TIFFANY, DO  Allergies  Allergen Reactions  . Sulfa Antibiotics     Chief Complaint  Patient presents with  . Medical Management of Chronic Issues    dementia, blood pressure    HPI:  Patient is a 80 y.o. female seen today at [facility] for follow up on high blood pressure. At last visit diovan was decreased to 160 mg daily.  Home blood pressure log review shows blood pressure ranging from 110-150/70-80. One high reading of 180/110.   Caregiver here with pt, reports she is doing well. Appetite is good. Good PO intake. No nausea or vomiting issues. No abdominal pain.  Review of Systems:  Review of Systems  Constitutional: Negative for chills, activity change and fatigue.  HENT: Negative.   Respiratory: Negative for cough, shortness of breath and wheezing.   Cardiovascular: Negative for chest pain, palpitations and leg swelling.  Gastrointestinal: Negative for nausea, abdominal pain, diarrhea and constipation.  Musculoskeletal: Negative for gait problem and neck pain.  Skin: Negative for color change, pallor, rash and wound.  Neurological: Negative for dizziness, weakness and headaches.    Past Medical History  Diagnosis Date  . HTN (hypertension)     unspecified  . Hyperlipidemia     mixed  . Osteopenia   . Lichen sclerosus   . BRUIT   . Disturbances of sensation of smell and taste   . Lipoma of face   . Other persistent mental disorders due to conditions classified elsewhere   . Memory loss    Past Surgical History  Procedure Laterality Date  . Tonsilectomy, adenoidectomy, bilateral myringotomy and tubes    . Oophorectomy  1988    DIAG LAP W BSO  . Cataract extraction, bilateral     Social History:   reports that she quit smoking about 11 years ago. She  has never used smokeless tobacco. She reports that she drinks about 7.7 oz of alcohol per week. She reports that she does not use illicit drugs.  Family History  Problem Relation Age of Onset  . Cancer Mother     OVARIAN  . Diabetes Father   . Hypertension Father     Medications: Patient's Medications  New Prescriptions   No medications on file  Previous Medications   ACETAMINOPHEN (TYLENOL) 650 MG CR TABLET    Take 650 mg by mouth. Take one as needed   ASPIRIN 81 MG TABLET    81 mg. Pt only taking when she remembers.   CALCIUM CARBONATE-VIT D-MIN (CALCIUM 1200 PO)    Take 1 tablet by mouth daily.    GALANTAMINE (RAZADYNE ER) 24 MG 24 HR CAPSULE    Take one capsule by mouth once daily   MEMANTINE (NAMENDA XR) 28 MG CP24 24 HR CAPSULE    Take 1 capsule (28 mg total) by mouth daily.   RANITIDINE (PX RANITIDINE) 75 MG TABLET    Take 1 tablet (75 mg total) by mouth at bedtime.   ROSUVASTATIN (CRESTOR) 40 MG TABLET    Take 1 tablet (40 mg total) by mouth daily.   VALSARTAN (DIOVAN) 160 MG TABLET    Take 1 tablet (160 mg total) by mouth daily.  Modified Medications   No medications on file  Discontinued Medications   No medications on file  Physical Exam: Filed Vitals:   08/13/14 1535  BP: 114/76  Pulse: 60  Temp: 97.9 F (36.6 C)  TempSrc: Oral  Weight: 98 lb (44.453 kg)    Physical Exam  Constitutional: She appears well-developed and well-nourished.  HENT:  Head: Normocephalic and atraumatic.  Neck: Normal range of motion. Neck supple.  Cardiovascular: Normal rate and regular rhythm.   Murmur heard. Pulmonary/Chest: Effort normal and breath sounds normal. She has no wheezes.  Abdominal: Soft. Bowel sounds are normal. She exhibits no distension. There is no tenderness. There is no rebound.  Musculoskeletal: Normal range of motion.  Neurological: She is alert.  Skin: Skin is warm and dry.    Labs reviewed: Basic Metabolic Panel:  Recent Labs  12/02/13 1057  04/07/14 1408 06/02/14 0926  NA 141 141 140  K 4.3 4.0 4.2  CL 105 105 102  CO2 22 28 18   GLUCOSE 100* 72 94  BUN 19 20 15   CREATININE 0.65 0.62 0.65  CALCIUM 9.4 9.5 9.8   Liver Function Tests:  Recent Labs  04/07/14 1408 06/02/14 0926 06/02/14 0932  AST 15 20 22   ALT 10 17 14   ALKPHOS 39 45 46  BILITOT 0.7 1.3* 1.2  PROT 6.1 6.7 6.6  ALBUMIN 4.1  --   --    No results for input(s): LIPASE, AMYLASE in the last 8760 hours. No results for input(s): AMMONIA in the last 8760 hours. CBC:  Recent Labs  06/02/14 0926  WBC 7.0  NEUTROABS 4.3  HGB 14.3  HCT 42.7  MCV 96  PLT 369   TSH: No results for input(s): TSH in the last 8760 hours. A1C: No results found for: HGBA1C Lipid Panel:  Recent Labs  04/07/14 1408 06/02/14 0926  CHOL 247* 294*  HDL 78 89  LDLCALC 152* 179*  TRIG 83 132  CHOLHDL 3.2 3.3     Assessment/Plan   1. Essential hypertension -blood pressure well controlled on current medication. Tolerating dose reduction of Diovan, will cont diovan 160 mg daily, okay to stop home monitoring at this time - Comprehensive metabolic panel; Future  2. Dementia with behavioral disturbance -conts on namenda and razadyne, behaviors and mood have been stable.   3. Hyperlipemia -conts on crestor, will follow up CMP  Follow up in 3 month

## 2014-09-17 ENCOUNTER — Ambulatory Visit (INDEPENDENT_AMBULATORY_CARE_PROVIDER_SITE_OTHER): Payer: Medicare Other | Admitting: Internal Medicine

## 2014-09-17 ENCOUNTER — Encounter: Payer: Self-pay | Admitting: Internal Medicine

## 2014-09-17 ENCOUNTER — Ambulatory Visit
Admission: RE | Admit: 2014-09-17 | Discharge: 2014-09-17 | Disposition: A | Discharge status: Home / Self Care | Payer: Medicare Other | Source: Ambulatory Visit | Attending: Internal Medicine | Admitting: Internal Medicine

## 2014-09-17 VITALS — BP 124/80 | HR 71 | Temp 98.4°F | Resp 18 | Ht 60.0 in | Wt 99.0 lb

## 2014-09-17 DIAGNOSIS — M1612 Unilateral primary osteoarthritis, left hip: Secondary | ICD-10-CM | POA: Diagnosis not present

## 2014-09-17 DIAGNOSIS — J301 Allergic rhinitis due to pollen: Secondary | ICD-10-CM

## 2014-09-17 DIAGNOSIS — F329 Major depressive disorder, single episode, unspecified: Secondary | ICD-10-CM | POA: Diagnosis not present

## 2014-09-17 DIAGNOSIS — M25552 Pain in left hip: Secondary | ICD-10-CM | POA: Diagnosis not present

## 2014-09-17 DIAGNOSIS — F0391 Unspecified dementia with behavioral disturbance: Secondary | ICD-10-CM | POA: Diagnosis not present

## 2014-09-17 DIAGNOSIS — F03918 Unspecified dementia, unspecified severity, with other behavioral disturbance: Secondary | ICD-10-CM

## 2014-09-17 DIAGNOSIS — F0280 Dementia in other diseases classified elsewhere without behavioral disturbance: Secondary | ICD-10-CM

## 2014-09-17 DIAGNOSIS — F0393 Unspecified dementia, unspecified severity, with mood disturbance: Secondary | ICD-10-CM

## 2014-09-17 MED ORDER — CETIRIZINE HCL 10 MG PO TABS: 10.0000 mg | ORAL_TABLET | Freq: Every day | ORAL | Status: DC

## 2014-09-17 MED ORDER — ESCITALOPRAM OXALATE 10 MG PO TABS: 5.0000 mg | ORAL_TABLET | Freq: Every day | ORAL | Status: DC

## 2014-09-17 NOTE — Progress Notes (Signed)
Patient ID: Margaret [name], female   DOB: [birth year], 80 y.o.   MRN: [id number]    Location:    [facility]   Place of Service:   OFFICE    Chief Complaint  Patient presents with  . Acute Visit    left hip and groin pain    HPI:  80 yo female seen today for left hip pain. She c/o increased stiffness since she returned from [facility] yesterday where she spent 4 days with her daughter. She traveled by airplane. No numbness/tingling. No trauma or falls. Pain is 5-6/10 on scale and worsens with movement and improves with rest and Tylenol arthritis. Pain is "annoying".  She states it feels like she "pulled something".    She was also noted to have increased clear tearing, rhinorrhea and sinus congestion since she returned from her trip. Home care aide states her daughter noted seasonal allergy sx's while she was visiting in [place]. No f/c or sore throat.   Home care aide is c/a pt's depressed mood. Pt does not believe she has any depression but admits that she has not been out with her friends lately. Per aide, pt has been feeling down, not participating in activities, sleeping more and not wanting to get dressed for at least 1 month. She also has been short tempered.   Past Medical History  Diagnosis Date  . HTN (hypertension)     unspecified  . Hyperlipidemia     mixed  . Osteopenia   . Lichen sclerosus   . BRUIT   . Disturbances of sensation of smell and taste   . Lipoma of face   . Other persistent mental disorders due to conditions classified elsewhere   . Memory loss     Past Surgical History  Procedure Laterality Date  . Tonsilectomy, adenoidectomy, bilateral myringotomy and tubes    . Oophorectomy  1988    DIAG LAP W BSO  . Cataract extraction, bilateral      Patient Care Team: Gayland Curry, DO as PCP - General (Geriatric Medicine) [facility] [place]  History   Social History  . Marital Status: Married    Spouse Name: N/A  . Number of Children: N/A  .  Years of Education: N/A   Occupational History  .      full time   Social History Main Topics  . Smoking status: Former Smoker    Quit date: 09/13/2002  . Smokeless tobacco: Never Used  . Alcohol Use: 7.7 oz/week    7 Standard drinks or equivalent, 7 Glasses of wine per week  . Drug Use: No  . Sexual Activity: No   Other Topics Concern  . Not on file   Social History Narrative   This patient is widowed. She lives alone  in a single level  home in [place], [place] from [place] to assist with meals and transportation.    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   Stopped smoking 20 years ago, drinks 1-2 glasses of wine a few days a week.    Patient has 2 sons and one daughter, all live out of town.   Has a Living Will, DO NOT RESUSCITATE.     reports that she quit smoking about 12 years ago. She has never used smokeless tobacco. She reports that she drinks about 7.7 oz of alcohol per week. She reports that she does not use illicit drugs.  Allergies  Allergen Reactions  . Sulfa Antibiotics     Medications: Patient's Medications  New Prescriptions   No medications on file  Previous Medications   ACETAMINOPHEN (TYLENOL) 650 MG CR TABLET    Take 650 mg by mouth. Take one as needed   ASPIRIN 81 MG TABLET    81 mg. Pt only taking when she remembers.   CALCIUM CARBONATE-VIT D-MIN (CALCIUM 1200 PO)    Take 1 tablet by mouth daily.    GALANTAMINE (RAZADYNE ER) 24 MG 24 HR CAPSULE    Take one capsule by mouth once daily   MEMANTINE (NAMENDA XR) 28 MG CP24 24 HR CAPSULE    Take 1 capsule (28 mg total) by mouth daily.   RANITIDINE (PX RANITIDINE) 75 MG TABLET    Take 1 tablet (75 mg total) by mouth at bedtime.   ROSUVASTATIN (CRESTOR) 40 MG TABLET    Take 1 tablet (40 mg total) by mouth daily.   VALSARTAN (DIOVAN) 160 MG TABLET    Take 1 tablet (160 mg total) by mouth daily.  Modified Medications   No  medications on file  Discontinued Medications   No medications on file    Review of Systems  Unable to perform ROS: Dementia    Filed Vitals:   09/17/14 1531  BP: 124/80  Pulse: 71  Temp: 98.4 F (36.9 C)  TempSrc: Oral  Resp: 18  Height: 5' (1.524 m)  Weight: 99 lb (44.906 kg)  SpO2: 94%   Body mass index is 19.33 kg/(m^2).  Physical Exam  Constitutional: She appears well-developed and well-nourished. No distress.  Musculoskeletal:  (+) scoliosis; right short leg; L>R hip markedly reduced internal rotation; left pelvic outflare; R>L SI joint restriction; antalgic gait; paravertebral lumbar muscle hypertrophy with ropy tissue texture changes; strength 4/5 in LLE; 5/5 in RLE; no distal swelling; neg SLR   Neurological: She is alert.  Skin: Skin is warm and dry. No rash noted.     Psychiatric: She has a normal mood and affect. Her speech is normal and behavior is normal. Cognition and memory are impaired. She exhibits abnormal recent memory.     Labs reviewed: No visits with results within 3 Month(s) from this visit. Latest known visit with results is:  Appointment on 06/02/2014  Component Date Value Ref Range Status  . Glucose 06/02/2014 94  65 - 99 mg/dL Final  . BUN 06/02/2014 15  8 - 27 mg/dL Final  . Creatinine, Ser 06/02/2014 0.65  0.57 - 1.00 mg/dL Final  . GFR calc non Af Amer 06/02/2014 86  >59 mL/min/1.73 Final  . GFR calc Af Amer 06/02/2014 99  >59 mL/min/1.73 Final  . BUN/Creatinine Ratio 06/02/2014 23  11 - 26 Final  . Sodium 06/02/2014 140  134 - 144 mmol/L Final  . Potassium 06/02/2014 4.2  3.5 - 5.2 mmol/L Final  . Chloride 06/02/2014 102  97 - 108 mmol/L Final  . CO2 06/02/2014 18  18 - 29 mmol/L Final  . Calcium 06/02/2014 9.8  8.7 - 10.3 mg/dL Final  . Total Protein 06/02/2014 6.7  6.0 - 8.5 g/dL Final  . Albumin 06/02/2014 4.6  3.5 - 4.8 g/dL Final  . Globulin, Total 06/02/2014 2.1  1.5 - 4.5 g/dL Final  . Albumin/Globulin Ratio 06/02/2014 2.2   1.1 - 2.5 Final  . Bilirubin Total 06/02/2014 1.3* 0.0 - 1.2 mg/dL Final  . Alkaline Phosphatase 06/02/2014 45  39 - 117 IU/L Final  . AST 06/02/2014 20  0 -  40 IU/L Final  . ALT 06/02/2014 17  0 - 32 IU/L Final  . Cholesterol, Total 06/02/2014 294* 100 - 199 mg/dL Final  . Triglycerides 06/02/2014 132  0 - 149 mg/dL Final  . HDL 06/02/2014 89  >39 mg/dL Final   Comment: According to ATP-III Guidelines, HDL-C >59 mg/dL is considered a negative risk factor for CHD.   Marland Kitchen VLDL Cholesterol Cal 06/02/2014 26  5 - 40 mg/dL Final  . LDL Calculated 06/02/2014 179* 0 - 99 mg/dL Final  . Chol/HDL Ratio 06/02/2014 3.3  0.0 - 4.4 ratio units Final   Comment:                                   T. Chol/HDL Ratio                                             Men  Women                               1/2 Avg.Risk  3.4    3.3                                   Avg.Risk  5.0    4.4                                2X Avg.Risk  9.6    7.1                                3X Avg.Risk 23.4   11.0   . WBC 06/02/2014 7.0  3.4 - 10.8 x10E3/uL Final  . RBC 06/02/2014 4.45  3.77 - 5.28 x10E6/uL Final  . Hemoglobin 06/02/2014 14.3  11.1 - 15.9 g/dL Final  . HCT 06/02/2014 42.7  34.0 - 46.6 % Final  . MCV 06/02/2014 96  79 - 97 fL Final  . MCH 06/02/2014 32.1  26.6 - 33.0 pg Final  . MCHC 06/02/2014 33.5  31.5 - 35.7 g/dL Final  . RDW 06/02/2014 14.6  12.3 - 15.4 % Final  . Platelets 06/02/2014 369  150 - 379 x10E3/uL Final  . Neutrophils Relative % 06/02/2014 61   Final  . Lymphs 06/02/2014 29   Final  . Monocytes 06/02/2014 7   Final  . Eos 06/02/2014 2   Final  . Basos 06/02/2014 1   Final  . Neutrophils Absolute 06/02/2014 4.3  1.4 - 7.0 x10E3/uL Final  . Lymphocytes Absolute 06/02/2014 2.0  0.7 - 3.1 x10E3/uL Final  . Monocytes Absolute 06/02/2014 0.5  0.1 - 0.9 x10E3/uL Final  . Eosinophils Absolute 06/02/2014 0.1  0.0 - 0.4 x10E3/uL Final  . Basophils Absolute 06/02/2014 0.0  0.0 - 0.2 x10E3/uL Final  .  Immature Granulocytes 06/02/2014 0   Final  . Immature Grans (Abs) 06/02/2014 0.0  0.0 - 0.1 x10E3/uL Final  . Total Protein 06/02/2014 6.6  6.0 - 8.5 g/dL Final  . Albumin 06/02/2014 4.6  3.5 - 4.8 g/dL Final  . Bilirubin Total 06/02/2014 1.2  0.0 -  1.2 mg/dL Final  . Bilirubin, Direct 06/02/2014 0.24  0.00 - 0.40 mg/dL Final  . Alkaline Phosphatase 06/02/2014 46  39 - 117 IU/L Final  . AST 06/02/2014 22  0 - 40 IU/L Final  . ALT 06/02/2014 14  0 - 32 IU/L Final    No results found.   Assessment/Plan   ICD-9-CM ICD-10-CM   1. Left hip pain 719.45 M25.552 Ambulatory referral to Orthopedic Surgery     DG Arthro Hip Left     DG HIP UNILAT WITH PELVIS 2-3 VIEWS LEFT  2. Depression due to dementia 311 F32.9 escitalopram (LEXAPRO) 10 MG tablet   294.10 F02.80   3. Dementia with behavioral disturbance 294.21 F03.91   4. Allergic rhinitis due to pollen 477.0 J30.1 cetirizine (ZYRTEC) 10 MG tablet     --f/u in 1 month for depression  Kylina Vultaggio S. Perlie Gold  [facility] and Adult [facility] [address] [place], [place] [address] [phone] Cell (Monday-Friday 8 AM - 5 PM) [phone] After 5 PM and follow prompts

## 2014-09-17 NOTE — Patient Instructions (Signed)
Continue other medications as ordered  Take generic zyrtec daily for seasonal allergy  Follow up in 1 month to reassess mood

## 2014-09-19 ENCOUNTER — Telehealth: Payer: Self-pay | Admitting: *Deleted

## 2014-09-19 NOTE — Telephone Encounter (Signed)
Patient caregiver called with Pain Level concerns. Called and spoke with patient and she is not sure what the caregiver called about and will have her call back when she gets in.

## 2014-09-23 ENCOUNTER — Telehealth: Payer: Self-pay | Admitting: *Deleted

## 2014-09-23 MED ORDER — TRAMADOL HCL 50 MG PO TABS: ORAL_TABLET | ORAL | Status: DC

## 2014-09-23 NOTE — Telephone Encounter (Signed)
Recommend tramadol 50mg  #30 take 1 po TID prn moderate -severe pain. No RF. Needs Ortho appt if not done already. May cont Tylenol arthritis while taking tramadol

## 2014-09-23 NOTE — Addendum Note (Signed)
Addended by: Rafael Bihari A on: 09/23/2014 04:51 PM   Modules accepted: Orders

## 2014-09-23 NOTE — Telephone Encounter (Signed)
Margaret [name] with [facility] called with questions regarding a DG Arthro hip you ordered. Did you mean to order this? And she stated usually they get an MRI. Please Advise.

## 2014-09-23 NOTE — Telephone Encounter (Signed)
Margaret [name], Caregiver called and stated that patient was just in on the 25th and they are doing 3 Tylenol's a day and no relief. Still complaining of pain and limping more. Is there something else she can take for the pain? Please Advise.

## 2014-09-23 NOTE — Telephone Encounter (Signed)
Patient caregiver notified and will pick up Rx.

## 2014-09-24 ENCOUNTER — Other Ambulatory Visit: Payer: Medicare Other

## 2014-09-24 NOTE — Telephone Encounter (Signed)
Margaret [name] at [facility] and agreed. She will let Kindra know.

## 2014-09-24 NOTE — Telephone Encounter (Signed)
Correct Test has already been completed and pt aware of results.

## 2014-09-26 ENCOUNTER — Other Ambulatory Visit: Payer: Self-pay | Admitting: Nurse Practitioner

## 2014-09-26 DIAGNOSIS — M1612 Unilateral primary osteoarthritis, left hip: Secondary | ICD-10-CM | POA: Diagnosis not present

## 2014-10-07 DIAGNOSIS — M1612 Unilateral primary osteoarthritis, left hip: Secondary | ICD-10-CM | POA: Diagnosis not present

## 2014-10-29 ENCOUNTER — Ambulatory Visit (INDEPENDENT_AMBULATORY_CARE_PROVIDER_SITE_OTHER): Payer: Medicare Other | Admitting: Internal Medicine

## 2014-10-29 ENCOUNTER — Encounter: Payer: Self-pay | Admitting: Internal Medicine

## 2014-10-29 VITALS — BP 98/62 | HR 67 | Temp 98.0°F | Resp 20 | Ht 60.0 in | Wt 95.4 lb

## 2014-10-29 DIAGNOSIS — M1612 Unilateral primary osteoarthritis, left hip: Secondary | ICD-10-CM

## 2014-10-29 DIAGNOSIS — F0393 Unspecified dementia, unspecified severity, with mood disturbance: Secondary | ICD-10-CM

## 2014-10-29 DIAGNOSIS — M25552 Pain in left hip: Secondary | ICD-10-CM

## 2014-10-29 DIAGNOSIS — F03918 Unspecified dementia, unspecified severity, with other behavioral disturbance: Secondary | ICD-10-CM

## 2014-10-29 DIAGNOSIS — F0391 Unspecified dementia with behavioral disturbance: Secondary | ICD-10-CM

## 2014-10-29 DIAGNOSIS — F0280 Dementia in other diseases classified elsewhere without behavioral disturbance: Secondary | ICD-10-CM | POA: Diagnosis not present

## 2014-10-29 DIAGNOSIS — F329 Major depressive disorder, single episode, unspecified: Secondary | ICD-10-CM

## 2014-10-29 MED ORDER — ESCITALOPRAM OXALATE 5 MG PO TABS: 5.0000 mg | ORAL_TABLET | Freq: Every day | ORAL | Status: DC

## 2014-10-29 NOTE — Patient Instructions (Signed)
Follow up 3 mos for routine visit. Continue current medications as ordered  Follow up with Ortho as scheduled

## 2014-10-29 NOTE — Progress Notes (Signed)
Patient ID: Margaret [name], female   DOB: Mar 08, 1935, 80 y.o.   MRN: [id number]    Location:    [facility]   Place of Service:   OFFICE  Chief Complaint  Patient presents with  . Medical Management of Chronic Issues    1 month follow-up    HPI:  80 yo female seen today for f/u depression. Her mood has improved on lexapro. She has been participating more in activities and getting dressed daily now. She does not have to urged to take a shower.  She rec'd left hip injection. Using significantly less tramadol. Will need PT and appt scheduled for tomorrow.   She is taking namenda xr and razadyne er for dementia  She is a poor historian due to dementia. Hx obtained from homecare aid, Leda Gauze.   Past Medical History  Diagnosis Date  . HTN (hypertension)     unspecified  . Hyperlipidemia     mixed  . Osteopenia   . Lichen sclerosus   . BRUIT   . Disturbances of sensation of smell and taste   . Lipoma of face   . Other persistent mental disorders due to conditions classified elsewhere   . Memory loss     Past Surgical History  Procedure Laterality Date  . Tonsilectomy, adenoidectomy, bilateral myringotomy and tubes    . Oophorectomy  1988    DIAG LAP W BSO  . Cataract extraction, bilateral      Patient Care Team: Gayland Curry, DO as PCP - General (Geriatric Medicine) [facility] [place]  History   Social History  . Marital Status: Married    Spouse Name: N/A  . Number of Children: N/A  . Years of Education: N/A   Occupational History  .      full time   Social History Main Topics  . Smoking status: Former Smoker    Quit date: 09/13/2002  . Smokeless tobacco: Never Used  . Alcohol Use: 7.7 oz/week    7 Standard drinks or equivalent, 7 Glasses of wine per week  . Drug Use: No  . Sexual Activity: No   Other Topics Concern  . Not on file   Social History Narrative   This patient is widowed. She lives alone  in a single level  home in  [place], [place] from [place] to assist with meals and transportation.    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   Stopped smoking 20 years ago, drinks 1-2 glasses of wine a few days a week.    Patient has 2 sons and one daughter, all live out of town.   Has a Living Will, DO NOT RESUSCITATE.     reports that she quit smoking about 12 years ago. She has never used smokeless tobacco. She reports that she drinks about 7.7 oz of alcohol per week. She reports that she does not use illicit drugs.  Allergies  Allergen Reactions  . Sulfa Antibiotics     Medications: Patient's Medications  New Prescriptions   No medications on file  Previous Medications   ACETAMINOPHEN (TYLENOL) 650 MG CR TABLET    Take 650 mg by mouth. Take one as needed   ASPIRIN 81 MG TABLET    81 mg. Pt only taking when she remembers.   CALCIUM CARBONATE-VIT D-MIN (CALCIUM 1200 PO)    Take 1 tablet by mouth daily.    CETIRIZINE (ZYRTEC) 10 MG TABLET  Take 1 tablet (10 mg total) by mouth daily.   ESCITALOPRAM (LEXAPRO) 5 MG TABLET    Take 5 mg by mouth daily.   GALANTAMINE (RAZADYNE ER) 24 MG 24 HR CAPSULE    Take one capsule by mouth once daily   MEMANTINE (NAMENDA XR) 28 MG CP24 24 HR CAPSULE    Take 1 capsule (28 mg total) by mouth daily.   RANITIDINE (PX RANITIDINE) 75 MG TABLET    Take 1 tablet (75 mg total) by mouth at bedtime.   ROSUVASTATIN (CRESTOR) 40 MG TABLET    Take 1 tablet (40 mg total) by mouth daily.   TRAMADOL (ULTRAM) 50 MG TABLET    Take one tablet by mouth three times daily as needed for moderate to severe pain   VALSARTAN (DIOVAN) 160 MG TABLET    TAKE 1 TABLET(160 MG) BY MOUTH DAILY  Modified Medications   No medications on file  Discontinued Medications   ESCITALOPRAM (LEXAPRO) 10 MG TABLET    Take 0.5 tablets (5 mg total) by mouth daily.    Review of Systems  Unable to perform ROS: Dementia    Filed Vitals:     10/29/14 1409  BP: 98/62  Pulse: 67  Temp: 98 F (36.7 C)  TempSrc: Oral  Resp: 20  Height: 5' (1.524 m)  Weight: 95 lb 6.4 oz (43.273 kg)  SpO2: 94%   Body mass index is 18.63 kg/(m^2).  Physical Exam  Constitutional: She appears well-developed. No distress.  Frail appearing in NAD  Cardiovascular: Normal rate, regular rhythm and intact distal pulses.  Exam reveals no gallop and no friction rub.   No murmur heard. Pulmonary/Chest: Effort normal and breath sounds normal. No respiratory distress. She has no wheezes. She has no rales.  Musculoskeletal: She exhibits no edema or tenderness.  Improved left ROM with decreased TTP at greater trochanter. No antalgic gait  Neurological: She is alert.  Skin: Skin is warm and dry. No rash noted.  Psychiatric: She has a normal mood and affect. Her behavior is normal.     Labs reviewed: No visits with results within 3 Month(s) from this visit. Latest known visit with results is:  Appointment on 06/02/2014  Component Date Value Ref Range Status  . Glucose 06/02/2014 94  65 - 99 mg/dL Final  . BUN 06/02/2014 15  8 - 27 mg/dL Final  . Creatinine, Ser 06/02/2014 0.65  0.57 - 1.00 mg/dL Final  . GFR calc non Af Amer 06/02/2014 86  >59 mL/min/1.73 Final  . GFR calc Af Amer 06/02/2014 99  >59 mL/min/1.73 Final  . BUN/Creatinine Ratio 06/02/2014 23  11 - 26 Final  . Sodium 06/02/2014 140  134 - 144 mmol/L Final  . Potassium 06/02/2014 4.2  3.5 - 5.2 mmol/L Final  . Chloride 06/02/2014 102  97 - 108 mmol/L Final  . CO2 06/02/2014 18  18 - 29 mmol/L Final  . Calcium 06/02/2014 9.8  8.7 - 10.3 mg/dL Final  . Total Protein 06/02/2014 6.7  6.0 - 8.5 g/dL Final  . Albumin 06/02/2014 4.6  3.5 - 4.8 g/dL Final  . Globulin, Total 06/02/2014 2.1  1.5 - 4.5 g/dL Final  . Albumin/Globulin Ratio 06/02/2014 2.2  1.1 - 2.5 Final  . Bilirubin Total 06/02/2014 1.3* 0.0 - 1.2 mg/dL Final  . Alkaline Phosphatase 06/02/2014 45  39 - 117 IU/L Final  . AST  06/02/2014 20  0 - 40 IU/L Final  . ALT 06/02/2014 17  0 - 32  IU/L Final  . Cholesterol, Total 06/02/2014 294* 100 - 199 mg/dL Final  . Triglycerides 06/02/2014 132  0 - 149 mg/dL Final  . HDL 06/02/2014 89  >39 mg/dL Final   Comment: According to ATP-III Guidelines, HDL-C >59 mg/dL is considered a negative risk factor for CHD.   Marland Kitchen VLDL Cholesterol Cal 06/02/2014 26  5 - 40 mg/dL Final  . LDL Calculated 06/02/2014 179* 0 - 99 mg/dL Final  . Chol/HDL Ratio 06/02/2014 3.3  0.0 - 4.4 ratio units Final   Comment:                                   T. Chol/HDL Ratio                                             Men  Women                               1/2 Avg.Risk  3.4    3.3                                   Avg.Risk  5.0    4.4                                2X Avg.Risk  9.6    7.1                                3X Avg.Risk 23.4   11.0   . WBC 06/02/2014 7.0  3.4 - 10.8 x10E3/uL Final  . RBC 06/02/2014 4.45  3.77 - 5.28 x10E6/uL Final  . Hemoglobin 06/02/2014 14.3  11.1 - 15.9 g/dL Final  . HCT 06/02/2014 42.7  34.0 - 46.6 % Final  . MCV 06/02/2014 96  79 - 97 fL Final  . MCH 06/02/2014 32.1  26.6 - 33.0 pg Final  . MCHC 06/02/2014 33.5  31.5 - 35.7 g/dL Final  . RDW 06/02/2014 14.6  12.3 - 15.4 % Final  . Platelets 06/02/2014 369  150 - 379 x10E3/uL Final  . Neutrophils Relative % 06/02/2014 61   Final  . Lymphs 06/02/2014 29   Final  . Monocytes 06/02/2014 7   Final  . Eos 06/02/2014 2   Final  . Basos 06/02/2014 1   Final  . Neutrophils Absolute 06/02/2014 4.3  1.4 - 7.0 x10E3/uL Final  . Lymphocytes Absolute 06/02/2014 2.0  0.7 - 3.1 x10E3/uL Final  . Monocytes Absolute 06/02/2014 0.5  0.1 - 0.9 x10E3/uL Final  . Eosinophils Absolute 06/02/2014 0.1  0.0 - 0.4 x10E3/uL Final  . Basophils Absolute 06/02/2014 0.0  0.0 - 0.2 x10E3/uL Final  . Immature Granulocytes 06/02/2014 0   Final  . Immature Grans (Abs) 06/02/2014 0.0  0.0 - 0.1 x10E3/uL Final  . Total Protein 06/02/2014 6.6   6.0 - 8.5 g/dL Final  . Albumin 06/02/2014 4.6  3.5 - 4.8 g/dL Final  . Bilirubin Total 06/02/2014 1.2  0.0 - 1.2 mg/dL Final  . Bilirubin, Direct 06/02/2014 0.24  0.00 -  0.40 mg/dL Final  . Alkaline Phosphatase 06/02/2014 46  39 - 117 IU/L Final  . AST 06/02/2014 22  0 - 40 IU/L Final  . ALT 06/02/2014 14  0 - 32 IU/L Final    No results found.   Assessment/Plan   ICD-9-CM ICD-10-CM   1. Depression due to dementia - improved 311 F32.9 escitalopram (LEXAPRO) 5 MG tablet   294.10 F02.80   2. Dementia with behavioral disturbance - stable 294.21 F03.91   3. Primary osteoarthritis of left hip - severe 715.15 M16.12   4. Left hip pain - improved 719.45 M25.552    --Follow up 3 mos for routine visit. Continue current medications as ordered. wil need fasting CMP, lipid panel  --Follow up with Ortho as scheduled  Megan Presti S. Perlie Gold  [facility] and Adult Medicine [address] [place], [place] [address] [phone] Cell (Monday-Friday 8 AM - 5 PM) [phone] After 5 PM and follow prompts

## 2014-10-30 DIAGNOSIS — R260 Ataxic gait: Secondary | ICD-10-CM | POA: Diagnosis not present

## 2014-10-30 DIAGNOSIS — M25552 Pain in left hip: Secondary | ICD-10-CM | POA: Diagnosis not present

## 2014-10-30 DIAGNOSIS — M1612 Unilateral primary osteoarthritis, left hip: Secondary | ICD-10-CM | POA: Diagnosis not present

## 2014-11-04 ENCOUNTER — Other Ambulatory Visit: Payer: Medicare Other

## 2014-11-04 DIAGNOSIS — I10 Essential (primary) hypertension: Secondary | ICD-10-CM | POA: Diagnosis not present

## 2014-11-05 LAB — COMPREHENSIVE METABOLIC PANEL
ALK PHOS: 52 IU/L (ref 39–117)
ALT: 83 IU/L — ABNORMAL HIGH (ref 0–32)
AST: 63 IU/L — ABNORMAL HIGH (ref 0–40)
Albumin/Globulin Ratio: 2.3 (ref 1.1–2.5)
Albumin: 4.3 g/dL (ref 3.5–4.8)
BILIRUBIN TOTAL: 0.4 mg/dL (ref 0.0–1.2)
BUN/Creatinine Ratio: 21 (ref 11–26)
BUN: 13 mg/dL (ref 8–27)
CALCIUM: 9.3 mg/dL (ref 8.7–10.3)
CO2: 23 mmol/L (ref 18–29)
Chloride: 105 mmol/L (ref 97–108)
Creatinine, Ser: 0.63 mg/dL (ref 0.57–1.00)
GFR calc non Af Amer: 86 mL/min/{1.73_m2} (ref >59)
GFR, EST AFRICAN AMERICAN: 99 mL/min/{1.73_m2} (ref >59)
GLOBULIN, TOTAL: 1.9 g/dL (ref 1.5–4.5)
Glucose: 94 mg/dL (ref 65–99)
POTASSIUM: 4.5 mmol/L (ref 3.5–5.2)
SODIUM: 143 mmol/L (ref 134–144)
TOTAL PROTEIN: 6.2 g/dL (ref 6.0–8.5)

## 2014-11-12 ENCOUNTER — Encounter: Payer: Self-pay | Admitting: Internal Medicine

## 2014-11-12 ENCOUNTER — Non-Acute Institutional Stay: Payer: Medicare Other | Admitting: Internal Medicine

## 2014-11-12 VITALS — BP 106/66 | HR 60 | Temp 98.1°F | Wt 100.0 lb

## 2014-11-12 DIAGNOSIS — F0393 Unspecified dementia, unspecified severity, with mood disturbance: Secondary | ICD-10-CM

## 2014-11-12 DIAGNOSIS — R740 Nonspecific elevation of levels of transaminase and lactic acid dehydrogenase [LDH]: Secondary | ICD-10-CM | POA: Diagnosis not present

## 2014-11-12 DIAGNOSIS — F0391 Unspecified dementia with behavioral disturbance: Secondary | ICD-10-CM

## 2014-11-12 DIAGNOSIS — F03918 Unspecified dementia, unspecified severity, with other behavioral disturbance: Secondary | ICD-10-CM

## 2014-11-12 DIAGNOSIS — F1010 Alcohol abuse, uncomplicated: Secondary | ICD-10-CM

## 2014-11-12 DIAGNOSIS — F0280 Dementia in other diseases classified elsewhere without behavioral disturbance: Secondary | ICD-10-CM

## 2014-11-12 DIAGNOSIS — M1612 Unilateral primary osteoarthritis, left hip: Secondary | ICD-10-CM

## 2014-11-12 DIAGNOSIS — F329 Major depressive disorder, single episode, unspecified: Secondary | ICD-10-CM

## 2014-11-12 DIAGNOSIS — F32A Depression, unspecified: Secondary | ICD-10-CM

## 2014-11-12 DIAGNOSIS — I10 Essential (primary) hypertension: Secondary | ICD-10-CM | POA: Diagnosis not present

## 2014-11-12 DIAGNOSIS — R7401 Elevation of levels of liver transaminase levels: Secondary | ICD-10-CM

## 2014-11-12 NOTE — Progress Notes (Signed)
Patient ID: Margaret [name], female   DOB: 29-May-1934, 80 y.o.   MRN: [id number]   Location:  [facility]  Code Status: DNR  Goals of Care:Advanced Directive information Does patient have an advance directive?: Yes, Type of Advance Directive: [place];Living will;Out of facility DNR (pink MOST or yellow form), Pre-existing out of facility DNR order (yellow form or pink MOST form): Yellow form placed in chart (order not valid for inpatient use), Does patient want to make changes to advanced directive?: No - Patient declined  Chief Complaint  Patient presents with  . Medical Management of Chronic Issues    depression, dementia. Here with caregiver Leda Gauze  . Hip Pain    left still hurting, using one Tramadol/Tylenol    HPI: Patient is a 80 y.o. white female seen in the [facility] today for med mgt of chronic diseases.    Hip pain:  Had steroid injection in hip with some improvement and taking tramadol once a day and tylenol once a day.  Was initially able to go a couple of weeks w/o any meds after the shot.   Pain is not excruciating.  She's unable to do the long walks anymore.  Trying to alleviate the friction.  She is agile.  She doesn't complain much.  Leda Gauze is with her 10-6.  Leda Gauze gives her pain medication before she leaves.  Sees Dr. Erlinda Hong.  Had PT evaluation and felt that she did not actually need PT.   Depression:  Spirits have been good. She's her jolly self.  Dementia:  Is taking memantine and galantamine for her memory.  Has been worsening.  When she's on, she's on.    Alcohol abuse:  Reviewed labs:  Has elevation of AST and ALT on labs.  Does drink wine.   At one point was having stomach trouble and caregiver thought it was from her drinking.  Has been eating more.  Sometimes drinks more than 2 glasses of wine per day.    Review of Systems:  Review of Systems  Constitutional: Negative for fever, chills and malaise/fatigue.       Gained 3  lbs  HENT: Negative for congestion and hearing loss.   Eyes: Negative for blurred vision.  Respiratory: Negative for shortness of breath.   Cardiovascular: Negative for chest pain and leg swelling.  Gastrointestinal: Positive for abdominal pain. Negative for heartburn, diarrhea, constipation, blood in stool and melena.  Genitourinary: Negative for dysuria, urgency and frequency.  Musculoskeletal: Positive for joint pain. Negative for falls.  Skin: Negative for rash.  Neurological: Negative for dizziness, loss of consciousness, weakness and headaches.  Psychiatric/Behavioral: Positive for depression and memory loss. The patient does not have insomnia.     Past Medical History  Diagnosis Date  . HTN (hypertension)     unspecified  . Hyperlipidemia     mixed  . Osteopenia   . Lichen sclerosus   . BRUIT   . Disturbances of sensation of smell and taste   . Lipoma of face   . Other persistent mental disorders due to conditions classified elsewhere   . Memory loss     Past Surgical History  Procedure Laterality Date  . Tonsilectomy, adenoidectomy, bilateral myringotomy and tubes    . Oophorectomy  1988    DIAG LAP W BSO  . Cataract extraction, bilateral      Social History:   reports that she quit smoking about 12 years ago. She has never used smokeless tobacco.  She reports that she drinks about 7.7 oz of alcohol per week. She reports that she does not use illicit drugs.  Allergies  Allergen Reactions  . Sulfa Antibiotics     Medications: Patient's Medications  New Prescriptions   No medications on file  Previous Medications   ACETAMINOPHEN (TYLENOL) 650 MG CR TABLET    Take 650 mg by mouth. Take one as needed   ASPIRIN 81 MG TABLET    81 mg. Pt only taking when she remembers.   CALCIUM CARBONATE-VIT D-MIN (CALCIUM 1200 PO)    Take 1 tablet by mouth daily.    CETIRIZINE (ZYRTEC) 10 MG TABLET    Take 1 tablet (10 mg total) by mouth daily.   ESCITALOPRAM (LEXAPRO) 5 MG  TABLET    Take 1 tablet (5 mg total) by mouth daily.   GALANTAMINE (RAZADYNE ER) 24 MG 24 HR CAPSULE    Take one capsule by mouth once daily   MEMANTINE (NAMENDA XR) 28 MG CP24 24 HR CAPSULE    Take 1 capsule (28 mg total) by mouth daily.   RANITIDINE (PX RANITIDINE) 75 MG TABLET    Take 1 tablet (75 mg total) by mouth at bedtime.   ROSUVASTATIN (CRESTOR) 40 MG TABLET    Take 1 tablet (40 mg total) by mouth daily.   TRAMADOL (ULTRAM) 50 MG TABLET    Take one tablet by mouth three times daily as needed for moderate to severe pain   VALSARTAN (DIOVAN) 160 MG TABLET    TAKE 1 TABLET(160 MG) BY MOUTH DAILY  Modified Medications   No medications on file  Discontinued Medications   No medications on file     Physical Exam: Filed Vitals:   11/12/14 1543  BP: 106/66  Pulse: 60  Temp: 98.1 F (36.7 C)  TempSrc: Oral  Weight: 100 lb (45.36 kg)  SpO2: 99%   Body mass index is 19.53 kg/(m^2). Physical Exam  Constitutional: She appears well-developed and well-nourished. No distress.  Thin white female, walks w/o assistive device  Cardiovascular: Normal rate, regular rhythm, normal heart sounds and intact distal pulses.   Pulmonary/Chest: Effort normal and breath sounds normal.  Abdominal: Soft. Bowel sounds are normal.  Musculoskeletal: She exhibits tenderness.  Left hip in groin region  Neurological: She is alert. No cranial nerve deficit.  oriented to person only not place or time  Skin: Skin is warm and dry.     Labs reviewed: Basic Metabolic Panel:  Recent Labs  04/07/14 1408 06/02/14 0926 11/04/14 0848  NA 141 140 143  K 4.0 4.2 4.5  CL 105 102 105  CO2 28 18 23   GLUCOSE 72 94 94  BUN 20 15 13   CREATININE 0.62 0.65 0.63  CALCIUM 9.5 9.8 9.3   Liver Function Tests:  Recent Labs  04/07/14 1408 06/02/14 0926 06/02/14 0932 11/04/14 0848  AST 15 20 22  63*  ALT 10 17 14  83*  ALKPHOS 39 45 46 52  BILITOT 0.7 1.3* 1.2 0.4  PROT 6.1 6.7 6.6 6.2  ALBUMIN 4.1  --    --   --    No results for input(s): LIPASE, AMYLASE in the last 8760 hours. No results for input(s): AMMONIA in the last 8760 hours. CBC:  Recent Labs  06/02/14 0926  WBC 7.0  NEUTROABS 4.3  HGB 14.3  HCT 42.7  MCV 96  PLT 369   Lipid Panel:  Recent Labs  04/07/14 1408 06/02/14 0926  CHOL 247* 294*  HDL 78  89  LDLCALC 152* 179*  TRIG 83 132  CHOLHDL 3.2 3.3   Patient Care Team: Gayland Curry, DO as PCP - General (Geriatric Medicine) [facility]  Assessment/Plan 1. Alcohol abuse -advised to limit wine per day to less than 8oz -caregiver will try to enforce but is not the one there 24x7, has someone else at night -discussed effects on liver and on her stomach  2. Dementia with behavioral disturbance -is gradually progressive, but moods, behaviors better lately, cont namenda XR and razadyne ER -cont 24 hr supervision and med mgt  3. Depression due to dementia -cont lexapro which has helped even at 5mg   4. Primary osteoarthritis of left hip -improved after injection a lot for 2 wks, but how intermitently has pain in left groin--using tylenol and tramadol just once a day each (must be careful with her alcohol use also mixing with tylenol)  5. Essential hypertension -bp well controlled with diovan only--cont  6. Transaminitis -due to alcohol abuse--not taking enough tylenol or tramadol to be causing -advised to cut back on alcohol--caregiver present and made aware, also   Labs/tests ordered: liver panel Next appt:  3 mos for annual exam with liver panel before  Franciszek Platten L. Kouper Spinella, D.O. [facility] [address], [place] [address] Cell Phone (Mon-Fri 8am-5pm):  [phone] On Call:  [phone] & follow prompts after 5pm & weekends Office Phone:  [phone] Office Fax:  [phone]

## 2015-01-03 ENCOUNTER — Other Ambulatory Visit: Payer: Self-pay | Admitting: Nurse Practitioner

## 2015-01-07 ENCOUNTER — Encounter: Payer: Self-pay | Admitting: Internal Medicine

## 2015-01-07 ENCOUNTER — Non-Acute Institutional Stay: Payer: Medicare Other | Admitting: Internal Medicine

## 2015-01-07 VITALS — BP 112/64 | HR 60 | Temp 97.8°F | Ht 60.0 in | Wt 107.0 lb

## 2015-01-07 DIAGNOSIS — F0391 Unspecified dementia with behavioral disturbance: Secondary | ICD-10-CM

## 2015-01-07 DIAGNOSIS — Z780 Asymptomatic menopausal state: Secondary | ICD-10-CM | POA: Diagnosis not present

## 2015-01-07 DIAGNOSIS — F0280 Dementia in other diseases classified elsewhere without behavioral disturbance: Secondary | ICD-10-CM

## 2015-01-07 DIAGNOSIS — I10 Essential (primary) hypertension: Secondary | ICD-10-CM

## 2015-01-07 DIAGNOSIS — F1010 Alcohol abuse, uncomplicated: Secondary | ICD-10-CM

## 2015-01-07 DIAGNOSIS — Z23 Encounter for immunization: Secondary | ICD-10-CM | POA: Diagnosis not present

## 2015-01-07 DIAGNOSIS — D2330 Other benign neoplasm of skin of unspecified part of face: Secondary | ICD-10-CM | POA: Diagnosis not present

## 2015-01-07 DIAGNOSIS — F329 Major depressive disorder, single episode, unspecified: Secondary | ICD-10-CM | POA: Diagnosis not present

## 2015-01-07 DIAGNOSIS — F03918 Unspecified dementia, unspecified severity, with other behavioral disturbance: Secondary | ICD-10-CM

## 2015-01-07 DIAGNOSIS — M1612 Unilateral primary osteoarthritis, left hip: Secondary | ICD-10-CM

## 2015-01-07 DIAGNOSIS — F32A Depression, unspecified: Secondary | ICD-10-CM

## 2015-01-07 DIAGNOSIS — F0393 Unspecified dementia, unspecified severity, with mood disturbance: Secondary | ICD-10-CM

## 2015-01-07 NOTE — Progress Notes (Signed)
Failed clock drawing  

## 2015-01-07 NOTE — Progress Notes (Signed)
Patient ID: Margaret [name], female   DOB: Sep 20, 1934, 80 y.o.   MRN: [id number]   Location:  [facility]  Code Status: DNR  Goals of Care:Advanced Directive information Does patient have an advance directive?: Yes, Type of Advance Directive: [place];Living will;Out of facility DNR (pink MOST or yellow form), Pre-existing out of facility DNR order (yellow form or pink MOST form): Yellow form placed in chart (order not valid for inpatient use), Does patient want to make changes to advanced directive?: No - Patient declined  Chief Complaint  Patient presents with  . Annual Exam    Comprehensive exam  . Medical Management of Chronic Issues    blood pressure, dementia, cholesterol. Here with [facility]  . lump    on right cheek, seems to be larger some days    HPI: Patient is a 80 y.o. white female seen in the [facility] today for her annual wellness exam, med mgt chronic diseases and acute concern of lump on her right cheek that seems larger some days than others.   Depression screen PHQ 2/9 01/07/2015  Decreased Interest 0  Down, Depressed, Hopeless 0  PHQ - 2 Score 0    Fall Risk  01/07/2015 10/29/2014  Falls in the past year? No No   Immunization History  Administered Date(s) Administered  . Influenza-Unspecified 04/25/2013   Function: Has caregiver who helps her with her daily routine.   Exercise:  Is walking for exercise some, but not overdoing it. Diet:  Fortunately has gained 7 lbs since last visit.  Eating better.  Drinking less wine--says no more than 2 glasses per day and her caregiver is making sure she eats first. Vision:  OD 20/20, OS 20/20, OU 20/20 Hearing: well without hearing aides  Review of Systems:  Review of Systems  Constitutional: Negative for fever, chills, weight loss and malaise/fatigue.  HENT: Negative for congestion and hearing loss.   Eyes: Negative for blurred vision.  Respiratory: Negative for shortness  of breath.   Cardiovascular: Negative for chest pain.  Gastrointestinal: Negative for heartburn, abdominal pain, constipation, blood in stool and melena.  Genitourinary: Negative for dysuria, urgency and frequency.  Musculoskeletal: Negative for falls.  Skin: Negative for rash.       subcutaneous cyst on right cheek  Neurological: Negative for dizziness, loss of consciousness, weakness and headaches.  Psychiatric/Behavioral: Positive for memory loss. Negative for depression. The patient is not nervous/anxious and does not have insomnia.     Past Medical History  Diagnosis Date  . HTN (hypertension)     unspecified  . Hyperlipidemia     mixed  . Osteopenia   . Lichen sclerosus   . BRUIT   . Disturbances of sensation of smell and taste   . Lipoma of face   . Other persistent mental disorders due to conditions classified elsewhere   . Memory loss     Past Surgical History  Procedure Laterality Date  . Tonsilectomy, adenoidectomy, bilateral myringotomy and tubes    . Oophorectomy  1988    DIAG LAP W BSO  . Cataract extraction, bilateral      Social History:   reports that she quit smoking about 12 years ago. She has never used smokeless tobacco. She reports that she drinks about 12.6 oz of alcohol per week. She reports that she does not use illicit drugs.  Allergies  Allergen Reactions  . Sulfa Antibiotics     Medications: Patient's Medications  New  Prescriptions   No medications on file  Previous Medications   ACETAMINOPHEN (TYLENOL) 650 MG CR TABLET    Take 650 mg by mouth. Take one as needed   ASPIRIN 81 MG TABLET    81 mg. Pt only taking when she remembers.   CALCIUM CARBONATE-VIT D-MIN (CALCIUM 1200 PO)    Take 1 tablet by mouth daily.    CETIRIZINE (ZYRTEC) 10 MG TABLET    Take 1 tablet (10 mg total) by mouth daily.   ESCITALOPRAM (LEXAPRO) 5 MG TABLET    Take 1 tablet (5 mg total) by mouth daily.   GALANTAMINE (RAZADYNE ER) 24 MG 24 HR CAPSULE    TAKE 1 CAPSULE  BY MOUTH EVERY DAY   MEMANTINE (NAMENDA XR) 28 MG CP24 24 HR CAPSULE    Take 1 capsule (28 mg total) by mouth daily.   RANITIDINE (PX RANITIDINE) 75 MG TABLET    Take 1 tablet (75 mg total) by mouth at bedtime.   ROSUVASTATIN (CRESTOR) 40 MG TABLET    Take 1 tablet (40 mg total) by mouth daily.   TRAMADOL (ULTRAM) 50 MG TABLET    Take one tablet by mouth three times daily as needed for moderate to severe pain   VALSARTAN (DIOVAN) 160 MG TABLET    TAKE 1 TABLET(160 MG) BY MOUTH DAILY  Modified Medications   No medications on file  Discontinued Medications   No medications on file     Physical Exam: Filed Vitals:   01/07/15 1444  BP: 112/64  Pulse: 60  Temp: 97.8 F (36.6 C)  TempSrc: Oral  Height: 5' (1.524 m)  Weight: 107 lb (48.535 kg)  SpO2: 99%   Body mass index is 20.9 kg/(m^2). Physical Exam  Constitutional: She appears well-nourished. No distress.  Thin white female  HENT:  Head: Normocephalic and atraumatic.  Right Ear: External ear normal.  Left Ear: External ear normal.  Nose: Nose normal.  Mouth/Throat: Oropharynx is clear and moist.  Eyes: Conjunctivae and EOM are normal. Pupils are equal, round, and reactive to light.  Neck: Neck supple. No JVD present.  Cardiovascular: Normal rate, regular rhythm, normal heart sounds and intact distal pulses.   Pulmonary/Chest: Effort normal and breath sounds normal. No respiratory distress. Right breast exhibits no inverted nipple, no mass, no nipple discharge, no skin change and no tenderness. Left breast exhibits no inverted nipple, no mass, no nipple discharge, no skin change and no tenderness.  Abdominal: Soft. Bowel sounds are normal. She exhibits no distension. There is no tenderness.  Genitourinary:  Deferred due to age and dementia  Musculoskeletal: Normal range of motion. She exhibits no tenderness.  Of right hip anymore  Neurological: She is alert. She has normal reflexes. She exhibits normal muscle tone.  Oriented  to person and place, poor short term memory--repeats questions and statements frequently  Skin: Skin is warm and dry.  Psychiatric: She has a normal mood and affect.     Labs reviewed: Basic Metabolic Panel:  Recent Labs  04/07/14 1408 06/02/14 0926 11/04/14 0848  NA 141 140 143  K 4.0 4.2 4.5  CL 105 102 105  CO2 28 18 23   GLUCOSE 72 94 94  BUN 20 15 13   CREATININE 0.62 0.65 0.63  CALCIUM 9.5 9.8 9.3   Liver Function Tests:  Recent Labs  04/07/14 1408 06/02/14 0926 06/02/14 0932 11/04/14 0848  AST 15 20 22  63*  ALT 10 17 14  83*  ALKPHOS 39 45 46 52  BILITOT  0.7 1.3* 1.2 0.4  PROT 6.1 6.7 6.6 6.2  ALBUMIN 4.1  --   --   --    No results for input(s): LIPASE, AMYLASE in the last 8760 hours. No results for input(s): AMMONIA in the last 8760 hours. CBC:  Recent Labs  06/02/14 0926  WBC 7.0  NEUTROABS 4.3  HGB 14.3  HCT 42.7  MCV 96  PLT 369   Lipid Panel:  Recent Labs  04/07/14 1408 06/02/14 0926  CHOL 247* 294*  HDL 78 89  LDLCALC 152* 179*  TRIG 83 132  CHOLHDL 3.2 3.3   Patient Care Team: Gayland Curry, DO as PCP - General (Geriatric Medicine) [facility]  Assessment/Plan 1. Dermoid cyst of face -agrees to see derm about this to confirm that it is benign - Ambulatory referral to Dermatology  2. Postmenopausal estrogen deficiency - agrees to bone density study  -cont ca with D daily - DG Bone Density; Future  3. Dementia with behavioral disturbance -more stable recently, continues on razadyne ER and namenda XR  4. Alcohol abuse -suspect she still drinks more than 2 glasses of wine which puts her at greater risk of falling--has been counseled and is at least eating before drinking no which should help some and has led to some weight gain--good in her case  5. Depression due to dementia -improved, continues lexapro 5mg  daily  6. Primary osteoarthritis of left hip -with bursitis; s/p steroid injection with great  improvement -pt refuses to use assistive device for support despite recommendations by therapy, ortho, caregiver and myself  7. Essential hypertension -bp is at goal w/ valsartan which she is tolerating well  8. Need for vaccination with 13-polyvalent pneumococcal conjugate vaccine - Pneumococcal conjugate vaccine 13-valent was given today   Labs/tests ordered: Orders Placed This Encounter  Procedures  . DG Bone Density    Standing Status: Future     Number of Occurrences:      Standing Expiration Date: 03/08/2016    Order Specific Question:  Reason for Exam (SYMPTOM  OR DIAGNOSIS REQUIRED)    Answer:  postmenopausal estrogen deficiency    Order Specific Question:  Preferred imaging location?    Answer:  [facility]  . Ambulatory referral to Dermatology    Referral Priority:  Routine    Referral Type:  Consultation    Referral Reason:  Specialty Services Required    Requested Specialty:  Dermatology    Number of Visits Requested:  1    Next appt: 3 mos for med mgt  Haruto Demaria L. Giankarlo Leamer, D.O. [facility] [address], [place] [address] Cell Phone (Mon-Fri 8am-5pm):  [phone] On Call:  [phone] & follow prompts after 5pm & weekends Office Phone:  [phone] Office Fax:  [phone]

## 2015-01-09 ENCOUNTER — Other Ambulatory Visit: Payer: Self-pay

## 2015-01-09 DIAGNOSIS — I10 Essential (primary) hypertension: Secondary | ICD-10-CM

## 2015-01-09 DIAGNOSIS — E785 Hyperlipidemia, unspecified: Secondary | ICD-10-CM

## 2015-01-12 ENCOUNTER — Other Ambulatory Visit: Payer: Medicare Other

## 2015-01-12 DIAGNOSIS — E785 Hyperlipidemia, unspecified: Secondary | ICD-10-CM

## 2015-01-12 DIAGNOSIS — I10 Essential (primary) hypertension: Secondary | ICD-10-CM | POA: Diagnosis not present

## 2015-01-13 LAB — HEPATIC FUNCTION PANEL
ALT: 22 IU/L (ref 0–32)
AST: 26 IU/L (ref 0–40)
Albumin: 4.5 g/dL (ref 3.5–4.8)
Alkaline Phosphatase: 50 IU/L (ref 39–117)
Bilirubin Total: 0.5 mg/dL (ref 0.0–1.2)
Bilirubin, Direct: 0.17 mg/dL (ref 0.00–0.40)
Total Protein: 6.6 g/dL (ref 6.0–8.5)

## 2015-01-16 DIAGNOSIS — M8580 Other specified disorders of bone density and structure, unspecified site: Secondary | ICD-10-CM | POA: Diagnosis not present

## 2015-01-16 LAB — HM DEXA SCAN

## 2015-01-20 DIAGNOSIS — D170 Benign lipomatous neoplasm of skin and subcutaneous tissue of head, face and neck: Secondary | ICD-10-CM | POA: Diagnosis not present

## 2015-01-20 DIAGNOSIS — Z85828 Personal history of other malignant neoplasm of skin: Secondary | ICD-10-CM | POA: Diagnosis not present

## 2015-01-20 DIAGNOSIS — L570 Actinic keratosis: Secondary | ICD-10-CM | POA: Diagnosis not present

## 2015-01-23 ENCOUNTER — Encounter: Payer: Self-pay | Admitting: *Deleted

## 2015-01-28 ENCOUNTER — Other Ambulatory Visit: Payer: Self-pay | Admitting: Nurse Practitioner

## 2015-01-30 ENCOUNTER — Telehealth: Payer: Self-pay | Admitting: *Deleted

## 2015-01-30 NOTE — Telephone Encounter (Signed)
Patient caregiver, Blaine Hamper called and stated that patient has been complaining about abdominal pain since Saturday. No diarrhea, a lot of grugling. Ate solid food yesterday. "Wierd feeling in Upper Middle part of her stomach" No fever but feels nauseated. Stated that patient has been drinking her wine but caregiver took out of the house on Monday. Caregiver asked to speak with you. Please Advise.

## 2015-01-31 ENCOUNTER — Other Ambulatory Visit: Payer: Self-pay | Admitting: Internal Medicine

## 2015-01-31 DIAGNOSIS — R1010 Upper abdominal pain, unspecified: Secondary | ICD-10-CM

## 2015-01-31 NOTE — Telephone Encounter (Signed)
She will need an appt Wednesday if she's not better by then.  In the meantime, let's obtain a bmp, cbc, liver panel and encourage her to drink a liquid diet until her stomach settles.

## 2015-02-02 NOTE — Telephone Encounter (Signed)
LMOM to return call.

## 2015-02-02 NOTE — Telephone Encounter (Signed)
Margaret [name] called back and stated that patient was feeling better but would still bring her for [facility] tomorrow.

## 2015-02-02 NOTE — Telephone Encounter (Signed)
Spoke with Leda Gauze, caregiver and she stated that patient was doing better since she took alcohol out of home and patient started eating. Caregiver stated that patient was drinking too much alcohol and not eating much. Will bring her in tomorrow for labwork.

## 2015-02-03 ENCOUNTER — Other Ambulatory Visit: Payer: Medicare Other

## 2015-02-03 DIAGNOSIS — R1010 Upper abdominal pain, unspecified: Secondary | ICD-10-CM

## 2015-02-04 ENCOUNTER — Non-Acute Institutional Stay: Payer: Medicare Other | Admitting: Internal Medicine

## 2015-02-04 ENCOUNTER — Encounter: Payer: Self-pay | Admitting: Internal Medicine

## 2015-02-04 VITALS — BP 132/96 | HR 60 | Temp 98.1°F | Wt 106.0 lb

## 2015-02-04 DIAGNOSIS — R109 Unspecified abdominal pain: Secondary | ICD-10-CM | POA: Insufficient documentation

## 2015-02-04 DIAGNOSIS — F0391 Unspecified dementia with behavioral disturbance: Secondary | ICD-10-CM

## 2015-02-04 DIAGNOSIS — F03918 Unspecified dementia, unspecified severity, with other behavioral disturbance: Secondary | ICD-10-CM | POA: Insufficient documentation

## 2015-02-04 DIAGNOSIS — R1030 Lower abdominal pain, unspecified: Secondary | ICD-10-CM

## 2015-02-04 DIAGNOSIS — R32 Unspecified urinary incontinence: Secondary | ICD-10-CM | POA: Diagnosis not present

## 2015-02-04 LAB — HEPATIC FUNCTION PANEL
ALT: 30 IU/L (ref 0–32)
AST: 27 IU/L (ref 0–40)
Albumin: 4.4 g/dL (ref 3.5–4.8)
Alkaline Phosphatase: 44 IU/L (ref 39–117)
Bilirubin Total: 0.8 mg/dL (ref 0.0–1.2)
Bilirubin, Direct: 0.22 mg/dL (ref 0.00–0.40)
Total Protein: 6.3 g/dL (ref 6.0–8.5)

## 2015-02-04 LAB — CBC WITH DIFFERENTIAL/PLATELET
Basophils Absolute: 0.1 10*3/uL (ref 0.0–0.2)
Basos: 1 %
EOS (ABSOLUTE): 0.1 10*3/uL (ref 0.0–0.4)
Eos: 2 %
Hematocrit: 39.3 % (ref 34.0–46.6)
Hemoglobin: 13.3 g/dL (ref 11.1–15.9)
Immature Grans (Abs): 0 10*3/uL (ref 0.0–0.1)
Immature Granulocytes: 0 %
Lymphocytes Absolute: 1.9 10*3/uL (ref 0.7–3.1)
Lymphs: 23 %
MCH: 32.4 pg (ref 26.6–33.0)
MCHC: 33.8 g/dL (ref 31.5–35.7)
MCV: 96 fL (ref 79–97)
Monocytes Absolute: 0.7 10*3/uL (ref 0.1–0.9)
Monocytes: 9 %
Neutrophils Absolute: 5.3 10*3/uL (ref 1.4–7.0)
Neutrophils: 65 %
Platelets: 321 10*3/uL (ref 150–379)
RBC: 4.11 x10E6/uL (ref 3.77–5.28)
RDW: 13.3 % (ref 12.3–15.4)
WBC: 8.2 10*3/uL (ref 3.4–10.8)

## 2015-02-04 LAB — BASIC METABOLIC PANEL
BUN/Creatinine Ratio: 22 (ref 11–26)
BUN: 16 mg/dL (ref 8–27)
CO2: 20 mmol/L (ref 18–29)
Calcium: 9.4 mg/dL (ref 8.7–10.3)
Chloride: 103 mmol/L (ref 97–108)
Creatinine, Ser: 0.74 mg/dL (ref 0.57–1.00)
GFR calc Af Amer: 90 mL/min/{1.73_m2} (ref >59)
GFR calc non Af Amer: 78 mL/min/{1.73_m2} (ref >59)
Glucose: 95 mg/dL (ref 65–99)
Potassium: 4 mmol/L (ref 3.5–5.2)
Sodium: 141 mmol/L (ref 134–144)

## 2015-02-04 LAB — LIPASE: Lipase: 34 U/L (ref 0–59)

## 2015-02-04 LAB — AMYLASE: Amylase: 83 U/L (ref 31–124)

## 2015-02-04 NOTE — Progress Notes (Signed)
Location:  [facility]  Code Status: DNR Goals of Care: Advanced Directive information Does patient have an advance directive?: Yes   Chief Complaint  Patient presents with  . Acute Visit    stomach pains since 01/23/15 daily. Started after drinking 1.5 liters of wine in 2 days. Not eating very well. Nausea, no vomiting. Leda Gauze took all wine out of the house for one week as of today. On liquid diet for 4 days.     HPI: Patient is a 80 y.o. female seen in the office today for follow up of abdominal pain. She is accompanied by Leda Gauze, her home health caregiver.  Stomach pain and nausea has been ongoing since 01/23/15. There is discomfort and fullness in the lower abdominal area.  She is eating less, sipping water, ensure. Caregiver has had her on clear liquid diet, but she did eat solid food on Monday. Patient complains of discomfort after eating. And it is especially after she drinks alcohol. She has stopped drinking alcohol, and caregiver notes some improvement, but has not resolved completely. No diarrhea or constipation. No heartburn. Caregiver notes audible gurgling that is sometimes followed by a bowel movement, however, caregiver notes that complaints continue after BM. Patient is concerned that patient will lose weight if she doesn't start to eat. Patient states that she does sometimes feel hungry and she eats when she's hungry. No weight loss noted currently.   Caregiver has noted she has been leaking urine and they have been using pads. This is a new occurrence. Denies frequency, urgency, fever, chills.  Coming down the hall to clinic, caregiver reports she lost her balance. No recent falls at home.   Review of Systems: Per caregiver as patient is poor historian.  Review of Systems  Constitutional: Positive for malaise/fatigue. Negative for fever, chills and weight loss.  Eyes: Negative for blurred vision.  Respiratory: Negative for cough and shortness of breath.     Cardiovascular: Negative for chest pain and palpitations.  Gastrointestinal: Positive for nausea and abdominal pain. Negative for heartburn, diarrhea, constipation, blood in stool and melena.  Genitourinary: Positive for frequency. Negative for dysuria, urgency, hematuria and flank pain.       Leaking urine at times  Musculoskeletal: Negative for falls.  Neurological: Positive for dizziness.  Psychiatric/Behavioral: Positive for memory loss.    Past Medical History  Diagnosis Date  . HTN (hypertension)     unspecified  . Hyperlipidemia     mixed  . Osteopenia   . Lichen sclerosus   . BRUIT   . Disturbances of sensation of smell and taste   . Lipoma of face   . Other persistent mental disorders due to conditions classified elsewhere   . Memory loss     Past Surgical History  Procedure Laterality Date  . Tonsilectomy, adenoidectomy, bilateral myringotomy and tubes    . Oophorectomy  1988    DIAG LAP W BSO  . Cataract extraction, bilateral      Allergies  Allergen Reactions  . Sulfa Antibiotics    Medications: Patient's Medications  New Prescriptions   No medications on file  Previous Medications   ACETAMINOPHEN (TYLENOL) 650 MG CR TABLET    Take 650 mg by mouth. Take one as needed   ASPIRIN 81 MG TABLET    81 mg. Pt only taking when she remembers.   CALCIUM CARBONATE-VIT D-MIN (CALCIUM 1200 PO)    Take 1 tablet by mouth daily.    CETIRIZINE (ZYRTEC) 10 MG TABLET  Take 1 tablet (10 mg total) by mouth daily.   ESCITALOPRAM (LEXAPRO) 5 MG TABLET    Take 1 tablet (5 mg total) by mouth daily.   GALANTAMINE (RAZADYNE ER) 24 MG 24 HR CAPSULE    TAKE 1 CAPSULE BY MOUTH EVERY DAY   NAMENDA XR 28 MG CP24 24 HR CAPSULE    TAKE 1 CAPSULE(28 MG) BY MOUTH DAILY   RANITIDINE (PX RANITIDINE) 75 MG TABLET    Take 1 tablet (75 mg total) by mouth at bedtime.   ROSUVASTATIN (CRESTOR) 40 MG TABLET    Take 1 tablet (40 mg total) by mouth daily.   TRAMADOL (ULTRAM) 50 MG TABLET    Take  one tablet by mouth three times daily as needed for moderate to severe pain   VALSARTAN (DIOVAN) 160 MG TABLET    TAKE 1 TABLET(160 MG) BY MOUTH DAILY  Modified Medications   No medications on file  Discontinued Medications   No medications on file    Physical Exam: Filed Vitals:   02/04/15 1441  BP: 132/96  Pulse: 60  Temp: 98.1 F (36.7 C)  TempSrc: Oral  Weight: 106 lb (48.081 kg)  SpO2: 99%   Physical Exam  Constitutional: She appears well-developed and well-nourished. No distress.  HENT:  Head: Normocephalic and atraumatic.  Cardiovascular: Normal rate, regular rhythm, normal heart sounds and intact distal pulses.   Pulmonary/Chest: Effort normal and breath sounds normal. No respiratory distress. She has no wheezes.  Abdominal: Soft. Bowel sounds are normal. She exhibits no distension. There is no tenderness.  Musculoskeletal: Normal range of motion. She exhibits no edema or tenderness.  Neurological: She is alert.  Psychiatric: She has a normal mood and affect. Her behavior is normal.  Poor recall    Labs reviewed: Basic Metabolic Panel:  Recent Labs  06/02/14 0926 11/04/14 0848 02/03/15 1004  NA 140 143 141  K 4.2 4.5 4.0  CL 102 105 103  CO2 18 23 20   GLUCOSE 94 94 95  BUN 15 13 16   CREATININE 0.65 0.63 0.74  CALCIUM 9.8 9.3 9.4   Liver Function Tests:  Recent Labs  11/04/14 0848 01/12/15 1009 02/03/15 1004  AST 63* 26 27  ALT 83* 22 30  ALKPHOS 52 50 44  BILITOT 0.4 0.5 0.8  PROT 6.2 6.6 6.3  ALBUMIN 4.3 4.5 4.4    Recent Labs  02/03/15 1004  LIPASE 34  AMYLASE 83    CBC:  Recent Labs  06/02/14 0926 02/03/15 1004  WBC 7.0 8.2  NEUTROABS 4.3 5.3  HGB 14.3  --   HCT 42.7 39.3  MCV 96  --   PLT 369  --    Lipid Panel:  Recent Labs  04/07/14 1408 06/02/14 0926  CHOL 247* 294*  HDL 78 89  LDLCALC 152* 179*  TRIG 83 132  CHOLHDL 3.2 3.3    Assessment/Plan  1. Lower abdominal pain Unknown etiology. ? UTI with  associated incontinence symptoms. Will check UA. If UA is negative will consider imaging of abdomen and pelvis.   2. Urinary incontinence, unspecified incontinence type New onset. Will check UA to r/o UTI.   3. Dementia with behavioral disturbance Stable. Continue Razadyne ER 24mg  once daily and Namenda XR 28mg  once daily.   Labs/tests ordered: UA- patient unable to give urine in office today. She is going to [facility] office tomorrow and will give sample then.  Next appt: 04/15/15 with Dr. Mariea Clonts for medical management.  Mariana Kaufman, RN, BSN  Student- Nurse Practitioner- [facility] [employer] [address], [place] [address] Office Phone:  [phone] Office Fax:  [phone]

## 2015-02-05 ENCOUNTER — Telehealth: Payer: Self-pay | Admitting: *Deleted

## 2015-02-05 ENCOUNTER — Ambulatory Visit: Payer: Medicare Other | Admitting: *Deleted

## 2015-02-05 ENCOUNTER — Other Ambulatory Visit: Payer: Medicare Other

## 2015-02-05 DIAGNOSIS — R32 Unspecified urinary incontinence: Secondary | ICD-10-CM

## 2015-02-05 DIAGNOSIS — Z23 Encounter for immunization: Secondary | ICD-10-CM

## 2015-02-05 DIAGNOSIS — R1030 Lower abdominal pain, unspecified: Secondary | ICD-10-CM

## 2015-02-05 NOTE — Addendum Note (Signed)
Addended by: Alvina Chou on: 02/05/2015 11:15 AM   Modules accepted: Orders

## 2015-02-05 NOTE — Telephone Encounter (Signed)
Santiago Glad, daughter called and wants to speak with you concerning her mother's abdominal issues. She wants to only speak with you. Please call # [phone]

## 2015-02-06 ENCOUNTER — Telehealth: Payer: Self-pay | Admitting: *Deleted

## 2015-02-06 LAB — URINALYSIS
Bilirubin, UA: NEGATIVE
Glucose, UA: NEGATIVE
Ketones, UA: NEGATIVE
Leukocytes, UA: NEGATIVE
Nitrite, UA: NEGATIVE
Protein, UA: NEGATIVE
RBC, UA: NEGATIVE
Specific Gravity, UA: 1.016 (ref 1.005–1.030)
Urobilinogen, Ur: 0.2 mg/dL (ref 0.2–1.0)
pH, UA: 8.5 — ABNORMAL HIGH (ref 5.0–7.5)

## 2015-02-06 LAB — URINE CULTURE: Organism ID, Bacteria: NO GROWTH

## 2015-02-06 NOTE — Telephone Encounter (Signed)
Caregiver, Leda Gauze is calling today wanting to know what patient can do for her Abdominal Discomfort. States that she is eating but stomach grugling.  Please Advise.

## 2015-02-09 DIAGNOSIS — R1011 Right upper quadrant pain: Secondary | ICD-10-CM | POA: Diagnosis not present

## 2015-02-09 NOTE — Telephone Encounter (Signed)
Appointment scheduled for patient to be seen Wednesday at [facility] with Dr. Mariea Clonts. Caregiver agreed and will notify daughter.

## 2015-02-09 NOTE — Telephone Encounter (Signed)
Patient daughter, Jaci Lazier called again today and would like to speak with you regarding patient. Please Call [phone]

## 2015-02-10 DIAGNOSIS — I700 Atherosclerosis of aorta: Secondary | ICD-10-CM | POA: Diagnosis not present

## 2015-02-10 DIAGNOSIS — R1011 Right upper quadrant pain: Secondary | ICD-10-CM | POA: Diagnosis not present

## 2015-02-11 ENCOUNTER — Telehealth: Payer: Self-pay

## 2015-02-11 ENCOUNTER — Ambulatory Visit: Payer: Medicare Other | Admitting: Internal Medicine

## 2015-02-11 NOTE — Telephone Encounter (Signed)
Patient had appt today 02/11/15, no show, called to reschedule. Spoke with Care giver Leda Gauze, she left a message yesterday  on triage voice mail to cancel appointment for today, they are going to wait for GI appt on the 31st.

## 2015-02-19 DIAGNOSIS — M1612 Unilateral primary osteoarthritis, left hip: Secondary | ICD-10-CM | POA: Diagnosis not present

## 2015-02-23 ENCOUNTER — Encounter: Payer: Self-pay | Admitting: Gastroenterology

## 2015-02-23 ENCOUNTER — Ambulatory Visit (INDEPENDENT_AMBULATORY_CARE_PROVIDER_SITE_OTHER): Payer: Medicare Other | Admitting: Gastroenterology

## 2015-02-23 VITALS — BP 96/62 | HR 58 | Ht 60.0 in | Wt 103.5 lb

## 2015-02-23 DIAGNOSIS — R110 Nausea: Secondary | ICD-10-CM | POA: Diagnosis not present

## 2015-02-23 DIAGNOSIS — R7989 Other specified abnormal findings of blood chemistry: Secondary | ICD-10-CM | POA: Diagnosis not present

## 2015-02-23 DIAGNOSIS — R630 Anorexia: Secondary | ICD-10-CM | POA: Diagnosis not present

## 2015-02-23 DIAGNOSIS — R1011 Right upper quadrant pain: Secondary | ICD-10-CM | POA: Insufficient documentation

## 2015-02-23 DIAGNOSIS — R945 Abnormal results of liver function studies: Secondary | ICD-10-CM

## 2015-02-23 NOTE — Progress Notes (Signed)
Reviewed and agree with management plan.  Tamberlyn Midgley T. Shauntay Brunelli, MD FACG 

## 2015-02-23 NOTE — Patient Instructions (Signed)
You have been scheduled for a HIDA scan at [facility] (1st floor) on 03-10-2015 . Please arrive  At 9:15 am prior to your scheduled appointment at  9:56 am . Make certain not to have anything to eat or drink at least 6 hours prior to your test. Should this appointment date or time not work well for you, please call radiology scheduling at [phone].  Do not take the Ranitidine 6 hours prior to the test. _____________________________________________________________________ hepatobiliary (HIDA) scan is an imaging procedure used to diagnose problems in the liver, gallbladder and bile ducts. In the HIDA scan, a radioactive chemical or tracer is injected into a vein in your arm. The tracer is handled by the liver like bile. Bile is a fluid produced and excreted by your liver that helps your digestive system break down fats in the foods you eat. Bile is stored in your gallbladder and the gallbladder releases the bile when you eat a meal. A special nuclear medicine scanner (gamma camera) tracks the flow of the tracer from your liver into your gallbladder and small intestine.  During your HIDA scan  You'll be asked to change into a hospital gown before your HIDA scan begins. Your health care team will position you on a table, usually on your back. The radioactive tracer is then injected into a vein in your arm.The tracer travels through your bloodstream to your liver, where it's taken up by the bile-producing cells. The radioactive tracer travels with the bile from your liver into your gallbladder and through your bile ducts to your small intestine.You may feel some pressure while the radioactive tracer is injected into your vein. As you lie on the table, a special gamma camera is positioned over your abdomen taking pictures of the tracer as it moves through your body. The gamma camera takes pictures continually for about an hour. You'll need to keep still during the HIDA scan. This can become  uncomfortable, but you may find that you can lessen the discomfort by taking deep breaths and thinking about other things. Tell your health care team if you're uncomfortable. The radiologist will watch on a computer the progress of the radioactive tracer through your body. The HIDA scan may be stopped when the radioactive tracer is seen in the gallbladder and enters your small intestine. This typically takes about an hour. In some cases extra imaging will be performed if original images aren't satisfactory, if morphine is given to help visualize the gallbladder or if the medication CCK is given to look at the contraction of the gallbladder. This test typically takes 2 hours to complete. ________________________________________________________________________

## 2015-02-23 NOTE — Progress Notes (Addendum)
02/23/2015 LEARA RAWL [id number] 1935/03/07   HISTORY OF PRESENT ILLNESS:  This is a 80 year old female who was referred here by [facility] for complaints of upper abdominal discomfort (apparently mostly RUQ), bloating, nausea, and poor appetite on and off for approximately 1 year. Seems to have been worsened recently although now improved again over the past several days. Patient has significant cognitive/memory impairment and is not able to offer any information regarding her symptoms other than how she is feeling currently/today. Her caregiver, Leda Gauze, provided all the information. Unsure if patient has ever seen GI for any issues in the past. We are going to try to get any previous records from her PCP. Recent ultrasound of the right upper quadrant unremarkable.  She has a very slight elevation of her ALT at 58, however, her CBC, CMP, and lipase are otherwise unremarkable. Caregiver and family concerned of gallbladder issues.    Past Medical History  Diagnosis Date  . HTN (hypertension)     unspecified  . Hyperlipidemia     mixed  . Osteopenia   . Lichen sclerosus   . BRUIT   . Disturbances of sensation of smell and taste   . Lipoma of face   . Other persistent mental disorders due to conditions classified elsewhere   . Memory loss    Past Surgical History  Procedure Laterality Date  . Tonsilectomy, adenoidectomy, bilateral myringotomy and tubes    . Oophorectomy  1988    DIAG LAP W BSO  . Cataract extraction, bilateral      reports that she quit smoking about 12 years ago. She has never used smokeless tobacco. She reports that she drinks about 12.6 oz of alcohol per week. She reports that she does not use illicit drugs. family history includes Cancer in her mother; Diabetes in her father; Hypertension in her father. Allergies  Allergen Reactions  . Sulfa Antibiotics       Outpatient Encounter Prescriptions as of 02/23/2015  Medication Sig  .  acetaminophen (TYLENOL) 650 MG CR tablet Take 650 mg by mouth. Take one as needed  . aspirin 81 MG tablet 81 mg. Pt only taking when she remembers.  . Calcium Carbonate-Vit D-Min (CALCIUM 1200 PO) Take 1 tablet by mouth daily.   . cetirizine (ZYRTEC) 10 MG tablet Take 1 tablet (10 mg total) by mouth daily.  Marland Kitchen escitalopram (LEXAPRO) 5 MG tablet Take 1 tablet (5 mg total) by mouth daily.  Marland Kitchen galantamine (RAZADYNE ER) 24 MG 24 hr capsule TAKE 1 CAPSULE BY MOUTH EVERY DAY  . NAMENDA XR 28 MG CP24 24 hr capsule TAKE 1 CAPSULE(28 MG) BY MOUTH DAILY  . ranitidine (PX RANITIDINE) 75 MG tablet Take 1 tablet (75 mg total) by mouth at bedtime. (Patient taking differently: Take 75 mg by mouth as needed. )  . rosuvastatin (CRESTOR) 40 MG tablet Take 1 tablet (40 mg total) by mouth daily.  . traMADol (ULTRAM) 50 MG tablet Take one tablet by mouth three times daily as needed for moderate to severe pain  . valsartan (DIOVAN) 160 MG tablet TAKE 1 TABLET(160 MG) BY MOUTH DAILY   No facility-administered encounter medications on file as of 02/23/2015.     REVIEW OF SYSTEMS  : All other systems reviewed and negative except where noted in the History of Present Illness.   PHYSICAL EXAM: BP 96/62 mmHg  Pulse 58  Ht 5' (1.524 m)  Wt 103 lb 8 oz (46.947 kg)  BMI 20.21 kg/m2  General: Well developed white female in no acute distress Head: Normocephalic and atraumatic Eyes:  Sclerae anicteric, conjunctiva pink. Ears: Normal auditory acuity Lungs: Clear throughout to auscultation Heart: Regular rate and rhythm Abdomen: Soft, non-distended.  Normal bowel sounds.  Non-tender. Musculoskeletal: Symmetrical with no gross deformities  Skin: No lesions on visible extremities Extremities: No edema  Neurological:  Grossly non-focal.  Significant cognitive/memory impairment. Psychological:  Alert and cooperative. Normal mood and affect  ASSESSMENT AND PLAN: -80 year old female with complaints of upper abdominal  discomfort (apparently mostly RUQ), bloating, nausea, and poor appetite on and off for approximately 1 year. Seem to have been worsened recently although now improved again over the past several days. Patient has significant cognitive/memory impairment and is not able to offer any information regarding her symptoms other than how she is feeling currently today. Her caregiver provided all the information. Unsure if patient has ever seen GI for any issues in the past. We are going to try to get any previous records from her PCP. Recent ultrasound of the right upper quadrant unremarkable.  She has a very slight elevation of her ALT, however, her labs are unremarkable. Caregiver and family concerned of gallbladder issues.  We'll start evaluation with a HIDA scan. If this is unremarkable then will likely proceed with CT scan.  CC:  Gayland Curry, DO   **Received records from [facility] where patient used to see Dr. Cristina Gong.  Last colonoscopy was 2003 with one hyperplastic polyp removed.  Last EGD was 2005 with chronic gastritis.  Records sent to be scanned on 02/24/2015.

## 2015-03-02 ENCOUNTER — Encounter: Payer: Self-pay | Admitting: Cardiology

## 2015-03-10 ENCOUNTER — Ambulatory Visit (HOSPITAL_COMMUNITY)
Admission: RE | Admit: 2015-03-10 | Discharge: 2015-03-10 | Disposition: A | Discharge status: Home / Self Care | Payer: Medicare Other | Source: Ambulatory Visit | Attending: Gastroenterology | Admitting: Gastroenterology

## 2015-03-10 DIAGNOSIS — R110 Nausea: Secondary | ICD-10-CM

## 2015-03-10 DIAGNOSIS — R7989 Other specified abnormal findings of blood chemistry: Secondary | ICD-10-CM

## 2015-03-10 DIAGNOSIS — R1011 Right upper quadrant pain: Secondary | ICD-10-CM | POA: Insufficient documentation

## 2015-03-10 DIAGNOSIS — R945 Abnormal results of liver function studies: Secondary | ICD-10-CM | POA: Insufficient documentation

## 2015-03-10 MED ORDER — TECHNETIUM TC 99M MEBROFENIN IV KIT
5.4000 | PACK | Freq: Once | INTRAVENOUS | Status: DC | PRN
  Administered 2015-03-10: 5.4 via INTRAVENOUS
  Filled 2015-03-10: qty 6

## 2015-03-12 ENCOUNTER — Telehealth: Payer: Self-pay | Admitting: Gastroenterology

## 2015-03-12 NOTE — Telephone Encounter (Signed)
Patient is asking for results.

## 2015-03-23 ENCOUNTER — Telehealth: Payer: Self-pay | Admitting: Gastroenterology

## 2015-03-23 DIAGNOSIS — R109 Unspecified abdominal pain: Secondary | ICD-10-CM

## 2015-03-23 NOTE — Telephone Encounter (Signed)
Caregiver for patient calling to report she had been doing well until yesterday. She started with the stomach gurgling and nausea again yesterday. She states she was told to call if it happened again and possibly have a CT. Please, advise.

## 2015-03-24 ENCOUNTER — Encounter: Payer: Self-pay | Admitting: *Deleted

## 2015-03-24 NOTE — Telephone Encounter (Signed)
Lab in EPIC. Scheduled CT at [facility] on 03/27/15 at 10:00 AM. NPO 4 hours prior and contrast at 8 and 9 AM. Patient's caregiver notified of appointments date, time and instructions. Contrast and instructions up front for pick up.

## 2015-03-24 NOTE — Telephone Encounter (Signed)
Ok to order CT scan abdomen and pelvis with contrast.  Will need BMP.  Thank you,  Jess

## 2015-03-25 ENCOUNTER — Other Ambulatory Visit (INDEPENDENT_AMBULATORY_CARE_PROVIDER_SITE_OTHER): Payer: Medicare Other

## 2015-03-25 DIAGNOSIS — M1612 Unilateral primary osteoarthritis, left hip: Secondary | ICD-10-CM | POA: Diagnosis not present

## 2015-03-25 DIAGNOSIS — R109 Unspecified abdominal pain: Secondary | ICD-10-CM | POA: Diagnosis not present

## 2015-03-25 DIAGNOSIS — M25552 Pain in left hip: Secondary | ICD-10-CM | POA: Diagnosis not present

## 2015-03-25 LAB — BASIC METABOLIC PANEL
BUN: 19 mg/dL (ref 6–23)
CHLORIDE: 105 meq/L (ref 96–112)
CO2: 28 meq/L (ref 19–32)
CREATININE: 0.74 mg/dL (ref 0.40–1.20)
Calcium: 9.5 mg/dL (ref 8.4–10.5)
GFR: 80.53 mL/min (ref >60.00)
Glucose, Bld: 102 mg/dL — ABNORMAL HIGH (ref 70–99)
POTASSIUM: 4 meq/L (ref 3.5–5.1)
Sodium: 142 mEq/L (ref 135–145)

## 2015-03-27 ENCOUNTER — Ambulatory Visit (INDEPENDENT_AMBULATORY_CARE_PROVIDER_SITE_OTHER)
Admission: RE | Admit: 2015-03-27 | Discharge: 2015-03-27 | Disposition: A | Discharge status: Home / Self Care | Payer: Medicare Other | Source: Ambulatory Visit | Attending: Gastroenterology | Admitting: Gastroenterology

## 2015-03-27 DIAGNOSIS — R109 Unspecified abdominal pain: Secondary | ICD-10-CM | POA: Diagnosis not present

## 2015-03-27 DIAGNOSIS — K5730 Diverticulosis of large intestine without perforation or abscess without bleeding: Secondary | ICD-10-CM | POA: Diagnosis not present

## 2015-03-27 MED ORDER — IOHEXOL 300 MG/ML  SOLN
100.0000 mL | Freq: Once | INTRAMUSCULAR | Status: AC | PRN
  Administered 2015-03-27: 100 mL via INTRAVENOUS

## 2015-04-01 NOTE — Progress Notes (Signed)
HPI: FU hypertension and hyperlipidemia. An echocardiogram performed in November of 2008 showed normal LV function. There was trivial aortic insufficiency. Since I last saw her, the patient denies any dyspnea on exertion, orthopnea, PND, pedal edema, palpitations, syncope or chest pain.   Current Outpatient Prescriptions  Medication Sig Dispense Refill  . acetaminophen (TYLENOL) 650 MG CR tablet Take 650 mg by mouth. Take one as needed    . aspirin 81 MG tablet 81 mg. Pt only taking when she remembers.    . Calcium Carbonate-Vit D-Min (CALCIUM 1200 PO) Take 1 tablet by mouth daily.     . cetirizine (ZYRTEC) 10 MG tablet Take 1 tablet (10 mg total) by mouth daily. 30 tablet 11  . escitalopram (LEXAPRO) 5 MG tablet Take 1 tablet (5 mg total) by mouth daily. 90 tablet 1  . galantamine (RAZADYNE ER) 24 MG 24 hr capsule TAKE 1 CAPSULE BY MOUTH EVERY DAY 90 capsule 1  . NAMENDA XR 28 MG CP24 24 hr capsule TAKE 1 CAPSULE(28 MG) BY MOUTH DAILY 30 capsule 5  . ranitidine (PX RANITIDINE) 75 MG tablet Take 1 tablet (75 mg total) by mouth at bedtime. (Patient taking differently: Take 75 mg by mouth as needed. )    . rosuvastatin (CRESTOR) 40 MG tablet Take 1 tablet (40 mg total) by mouth daily. 90 tablet 3  . traMADol (ULTRAM) 50 MG tablet Take one tablet by mouth three times daily as needed for moderate to severe pain 50 tablet 0  . valsartan (DIOVAN) 160 MG tablet TAKE 1 TABLET(160 MG) BY MOUTH DAILY 90 tablet 1   No current facility-administered medications for this visit.     Past Medical History  Diagnosis Date  . HTN (hypertension)     unspecified  . Hyperlipidemia     mixed  . Osteopenia   . Lichen sclerosus   . BRUIT   . Disturbances of sensation of smell and taste   . Lipoma of face   . Other persistent mental disorders due to conditions classified elsewhere   . Memory loss     Past Surgical History  Procedure Laterality Date  . Tonsilectomy, adenoidectomy, bilateral  myringotomy and tubes    . Oophorectomy  1988    DIAG LAP W BSO  . Cataract extraction, bilateral      Social History   Social History  . Marital Status: Married    Spouse Name: N/A  . Number of Children: 3  . Years of Education: N/A   Occupational History  . retired travel agent     full time   Social History Main Topics  . Smoking status: Former Smoker    Quit date: 09/13/2002  . Smokeless tobacco: Never Used  . Alcohol Use: 12.6 oz/week    7 Standard drinks or equivalent, 14 Glasses of wine per week     Comment: 2 glasses of wine each evening  . Drug Use: No  . Sexual Activity: No   Other Topics Concern  . Not on file   Social History Narrative   This patient is widowed. She lives alone  in a single level  home in [place], [place] from [place] to assist with meals and transportation.    Patient is retired Garment/textile technologist.    Main exercise is walking, she has a Programmer, systems.   Stopped smoking 20 years ago, drinks 1-2 glasses of wine a  day   Patient has 2 sons and  one daughter, all live out of town.   Has a Living Will, DO NOT RESUSCITATE.    ROS: Minimal problems but no fevers or chills, productive cough, hemoptysis, dysphasia, odynophagia, melena, hematochezia, dysuria, hematuria, rash, seizure activity, orthopnea, PND, pedal edema, claudication. Remaining systems are negative.  Physical Exam: Well-developed well-nourished in no acute distress.  Skin is warm and dry.  HEENT is normal.  Neck is supple.  Chest is clear to auscultation with normal expansion.  Cardiovascular exam is regular rate and rhythm.  Abdominal exam nontender or distended. No masses palpated. Extremities show no edema. neuro Memory problems  ECG Sinus rhythm, nonspecific ST changes.

## 2015-04-06 ENCOUNTER — Ambulatory Visit (INDEPENDENT_AMBULATORY_CARE_PROVIDER_SITE_OTHER): Payer: Medicare Other | Admitting: Cardiology

## 2015-04-06 ENCOUNTER — Encounter: Payer: Self-pay | Admitting: Cardiology

## 2015-04-06 VITALS — BP 104/63 | HR 77 | Ht 60.0 in | Wt 108.5 lb

## 2015-04-06 DIAGNOSIS — I10 Essential (primary) hypertension: Secondary | ICD-10-CM

## 2015-04-06 DIAGNOSIS — F0391 Unspecified dementia with behavioral disturbance: Secondary | ICD-10-CM | POA: Diagnosis not present

## 2015-04-06 DIAGNOSIS — F03918 Unspecified dementia, unspecified severity, with other behavioral disturbance: Secondary | ICD-10-CM

## 2015-04-06 DIAGNOSIS — E785 Hyperlipidemia, unspecified: Secondary | ICD-10-CM

## 2015-04-06 NOTE — Patient Instructions (Signed)
Your physician recommends that you schedule a follow-up appointment in: 6 MONTHS WITH DR CRENSHAW  

## 2015-04-06 NOTE — Assessment & Plan Note (Signed)
This appears to be a significant issue.Management per primary care.

## 2015-04-06 NOTE — Assessment & Plan Note (Signed)
Continue statin. 

## 2015-04-06 NOTE — Assessment & Plan Note (Signed)
Blood pressure controlled. Continue present medications. 

## 2015-04-07 ENCOUNTER — Ambulatory Visit: Payer: Medicare Other | Admitting: Cardiology

## 2015-04-15 ENCOUNTER — Encounter: Payer: Self-pay | Admitting: Internal Medicine

## 2015-04-26 DIAGNOSIS — D126 Benign neoplasm of colon, unspecified: Secondary | ICD-10-CM

## 2015-04-26 HISTORY — DX: Benign neoplasm of colon, unspecified: D12.6

## 2015-04-30 ENCOUNTER — Other Ambulatory Visit: Payer: Self-pay | Admitting: Cardiology

## 2015-04-30 NOTE — Telephone Encounter (Signed)
REFILL 

## 2015-05-01 DIAGNOSIS — Z6821 Body mass index (BMI) 21.0-21.9, adult: Secondary | ICD-10-CM | POA: Diagnosis not present

## 2015-05-01 DIAGNOSIS — E784 Other hyperlipidemia: Secondary | ICD-10-CM | POA: Diagnosis not present

## 2015-05-01 DIAGNOSIS — M810 Age-related osteoporosis without current pathological fracture: Secondary | ICD-10-CM | POA: Insufficient documentation

## 2015-05-01 DIAGNOSIS — R1013 Epigastric pain: Secondary | ICD-10-CM | POA: Diagnosis not present

## 2015-05-01 DIAGNOSIS — I10 Essential (primary) hypertension: Secondary | ICD-10-CM | POA: Diagnosis not present

## 2015-05-01 DIAGNOSIS — F0280 Dementia in other diseases classified elsewhere without behavioral disturbance: Secondary | ICD-10-CM | POA: Diagnosis not present

## 2015-05-01 DIAGNOSIS — Z1389 Encounter for screening for other disorder: Secondary | ICD-10-CM | POA: Diagnosis not present

## 2015-05-01 DIAGNOSIS — M25551 Pain in right hip: Secondary | ICD-10-CM | POA: Diagnosis not present

## 2015-05-04 DIAGNOSIS — E559 Vitamin D deficiency, unspecified: Secondary | ICD-10-CM | POA: Diagnosis present

## 2015-05-13 ENCOUNTER — Ambulatory Visit (INDEPENDENT_AMBULATORY_CARE_PROVIDER_SITE_OTHER): Payer: Medicare Other | Admitting: Gastroenterology

## 2015-05-13 ENCOUNTER — Encounter: Payer: Self-pay | Admitting: Gastroenterology

## 2015-05-13 VITALS — BP 100/70 | HR 72 | Ht 59.5 in | Wt 110.5 lb

## 2015-05-13 DIAGNOSIS — R1011 Right upper quadrant pain: Secondary | ICD-10-CM

## 2015-05-13 DIAGNOSIS — Z1211 Encounter for screening for malignant neoplasm of colon: Secondary | ICD-10-CM

## 2015-05-13 MED ORDER — NA SULFATE-K SULFATE-MG SULF 17.5-3.13-1.6 GM/177ML PO SOLN: 1.0000 | Freq: Once | ORAL | Status: DC

## 2015-05-13 NOTE — Patient Instructions (Signed)
You have been scheduled for a colonoscopy. Please follow written instructions given to you at your visit today.  Please pick up your prep supplies at the pharmacy within the next 1-3 days. If you use inhalers (even only as needed), please bring them with you on the day of your procedure. Your physician has requested that you go to www.startemmi.com and enter the access code given to you at your visit today. This web site gives a general overview about your procedure. However, you should still follow specific instructions given to you by our office regarding your preparation for the procedure.  Thank you for choosing me and [facility].  Pricilla Riffle. Dagoberto Ligas., MD., Marval Regal  cc: Lutricia Feil, MD

## 2015-05-13 NOTE — Progress Notes (Signed)
    History of Present Illness: This is a 81 year old female returning for follow-up of right upper quadrant/periumbilical/epigastric pain, abdominal bloating, nausea and a poor appetite on and off for the past year. She is accompanied by her caregiver, Leda Gauze. An abdominal/pelvicCT scan in 03/2015 showed only colonic diverticulosis and CCK HIDA in 02/2015 was unremarkable. She began taking Zantac regularly, discontinued drinking wine and all symptoms have resolved. She is now taking Zantac when necessary. She wishes to discuss colorectal cancer screening. Her last colonoscopy was performed in 2003 by Dr. Cristina Gong with one diminutive hyperplastic colon polyp removed.   Current Medications, Allergies, Past Medical History, Past Surgical History, Family History and Social History were reviewed in [employer] record.  Physical Exam: General: Well developed, well nourished, no acute distress Head: Normocephalic and atraumatic Eyes:  sclerae anicteric, EOMI Ears: Normal auditory acuity Mouth: No deformity or lesions Lungs: Clear throughout to auscultation Heart: Regular rate and rhythm; no murmurs, rubs or bruits Abdomen: Soft, non tender and non distended. No masses, hepatosplenomegaly or hernias noted. Normal Bowel sounds Rectal: deferred to colonoscopy Musculoskeletal: Symmetrical with no gross deformities  Pulses:  Normal pulses noted Extremities: No clubbing, cyanosis, edema or deformities noted Neurological: Alert oriented x 4, memory deficits Psychological:  Alert and cooperative. Normal mood and affect  Assessment and Recommendations:  1. Suspected GERD, duodentitis or gastritis has resolved with Zantac and avoidance of wine. Zantac 150 mg twice a day when necessary or Prilosec 20 mg OTC daily when necessary for recurrent symptoms.  2. CRC screening, average risk. We discussed elective screening colonoscopy. Her last colonoscopy was over 13 years ago. She would  like to proceed. The risks (including bleeding, perforation, infection, missed lesions, medication reactions and possible hospitalization or surgery if complications occur), benefits, and alternatives to colonoscopy with possible biopsy and possible polypectomy were discussed with the patient and they consent to proceed.

## 2015-05-15 ENCOUNTER — Encounter: Payer: Self-pay | Admitting: Gastroenterology

## 2015-05-15 ENCOUNTER — Ambulatory Visit (AMBULATORY_SURGERY_CENTER): Payer: Medicare Other | Admitting: Gastroenterology

## 2015-05-15 VITALS — BP 104/71 | HR 64 | Temp 98.8°F | Resp 22 | Ht 59.0 in | Wt 110.0 lb

## 2015-05-15 DIAGNOSIS — Z1211 Encounter for screening for malignant neoplasm of colon: Secondary | ICD-10-CM

## 2015-05-15 DIAGNOSIS — D125 Benign neoplasm of sigmoid colon: Secondary | ICD-10-CM

## 2015-05-15 DIAGNOSIS — K219 Gastro-esophageal reflux disease without esophagitis: Secondary | ICD-10-CM | POA: Diagnosis not present

## 2015-05-15 DIAGNOSIS — F329 Major depressive disorder, single episode, unspecified: Secondary | ICD-10-CM | POA: Diagnosis not present

## 2015-05-15 DIAGNOSIS — F0390 Unspecified dementia without behavioral disturbance: Secondary | ICD-10-CM | POA: Diagnosis not present

## 2015-05-15 DIAGNOSIS — I10 Essential (primary) hypertension: Secondary | ICD-10-CM | POA: Diagnosis not present

## 2015-05-15 MED ORDER — SODIUM CHLORIDE 0.9 % IV SOLN: 500.0000 mL | INTRAVENOUS | Status: DC

## 2015-05-15 NOTE — Patient Instructions (Addendum)
YOU HAD AN ENDOSCOPIC PROCEDURE TODAY AT [facility]:   Refer to the procedure report that was given to you for any specific questions about what was found during the examination.  If the procedure report does not answer your questions, please call your gastroenterologist to clarify.  If you requested that your care partner not be given the details of your procedure findings, then the procedure report has been included in a sealed envelope for you to review at your convenience later.  YOU SHOULD EXPECT: Some feelings of bloating in the abdomen. Passage of more gas than usual.  Walking can help get rid of the air that was put into your GI tract during the procedure and reduce the bloating. If you had a lower endoscopy (such as a colonoscopy or flexible sigmoidoscopy) you may notice spotting of blood in your stool or on the toilet paper. If you underwent a bowel prep for your procedure, you may not have a normal bowel movement for a few days.  Please Note:  You might notice some irritation and congestion in your nose or some drainage.  This is from the oxygen used during your procedure.  There is no need for concern and it should clear up in a day or so.  SYMPTOMS TO REPORT IMMEDIATELY:   Following lower endoscopy (colonoscopy or flexible sigmoidoscopy):  Excessive amounts of blood in the stool  Significant tenderness or worsening of abdominal pains  Swelling of the abdomen that is new, acute  Fever of 100F or higher   For urgent or emergent issues, a gastroenterologist can be reached at any hour by calling [phone].   DIET: Your first meal following the procedure should be a small meal and then it is ok to progress to your normal diet. Heavy or fried foods are harder to digest and may make you feel nauseous or bloated.  Likewise, meals heavy in dairy and vegetables can increase bloating.  Drink plenty of fluids but you should avoid alcoholic beverages for 24 hours. Try to  increase the fiber in your diet due to you diverticulosis.  ACTIVITY:  You should plan to take it easy for the rest of today and you should NOT DRIVE or use heavy machinery until tomorrow (because of the sedation medicines used during the test).    FOLLOW UP: Our staff will call the number listed on your records the next business day following your procedure to check on you and address any questions or concerns that you may have regarding the information given to you following your procedure. If we do not reach you, we will leave a message.  However, if you are feeling well and you are not experiencing any problems, there is no need to return our call.  We will assume that you have returned to your regular daily activities without incident.  If any biopsies were taken you will be contacted by phone or by letter within the next 1-3 weeks.  Please call us at [phone] if you have not heard about the biopsies in 3 weeks.    SIGNATURES/CONFIDENTIALITY: You and/or your care partner have signed paperwork which will be entered into your electronic medical record.  These signatures attest to the fact that that the information above on your After Visit Summary has been reviewed and is understood.  Full responsibility of the confidentiality of this discharge information lies with you and/or your care-partner.  Read all of the handouts given to you by your recovery room  nurse.  Hoyt Koch for choosing [place] for your healthcare needs.  HOLD ALL NSAIDS FOR TWO WEEKS; ASPIRIN, ALEVE, AND IBUPROFEN

## 2015-05-15 NOTE — Progress Notes (Signed)
Called to room to assist during endoscopic procedure.  Patient ID and intended procedure confirmed with present staff. Received instructions for my participation in the procedure from the performing physician.  

## 2015-05-15 NOTE — Progress Notes (Signed)
Report to PACU, RN, vss, BBS= Clear.  

## 2015-05-15 NOTE — Op Note (Signed)
[facility]  [address] [place], [id number]   COLONOSCOPY PROCEDURE REPORT  PATIENT: Margaret [name], Margaret [name]  MR#: [id number] BIRTHDATE: 18-Jul-1934 , 80  yrs. old GENDER: female ENDOSCOPIST: Ladene Artist, MD, [facility] REFERRED BY:W.  Lutricia Feil, M.D. PROCEDURE DATE:  05/15/2015 PROCEDURE:   Colonoscopy, screening and Colonoscopy with snare polypectomy First Screening Colonoscopy - Avg.  risk and is 50 yrs.  old or older - No.  Prior Negative Screening - Now for repeat screening. N/A  History of Adenoma - Now for follow-up colonoscopy & has been > or = to 3 yrs.  N/A  Polyps removed today? Yes ASA CLASS:   Class II INDICATIONS:Screening for colonic neoplasia and Colorectal Neoplasm Risk Assessment for this procedure is average risk. MEDICATIONS: Monitored anesthesia care and Propofol 100 mg IV DESCRIPTION OF PROCEDURE:   After the risks benefits and alternatives of the procedure were thoroughly explained, informed consent was obtained.  The digital rectal exam revealed no abnormalities of the rectum.   The LB [id number]-[id number]  endoscope was introduced through the anus and advanced to the cecum, which was identified by both the appendix and ileocecal valve. No adverse events experienced.   The quality of the prep was good.  (Suprep was used)  The instrument was then slowly withdrawn as the colon was fully examined. Estimated blood loss is zero unless otherwise noted in this procedure report.   COLON FINDINGS: A pedunculated polyp measuring 10 mm in size was found in the sigmoid colon.  A polypectomy was performed using snare cautery.  The resection was complete, the polyp tissue was completely retrieved and sent to histology.   There was moderate diverticulosis noted in the sigmoid colon and descending colon with associated colonic spasm, luminal narrowing and muscular hypertrophy.   The examination was otherwise normal.  Retroflexed views revealed internal Grade  I hemorrhoids. The time to cecum = 2.5 Withdrawal time = 9.1   The scope was withdrawn and the procedure completed. COMPLICATIONS: There were no immediate complications.  ENDOSCOPIC IMPRESSION: 1.   Pedunculated polyp in the sigmoid colon; polypectomy performed using snare cautery 2.   Moderate diverticulosis in the sigmoid colon and descending colon 3.   Grade l internal hemorrhoids  RECOMMENDATIONS: 1.  Hold Aspirin and all other NSAIDS for 2 weeks. 2.  Await pathology results 3.  High fiber diet with liberal fluid intake. 4.  Given your age, you will not need another colonoscopy for colon cancer screening or polyp surveillance.  These types of tests usually stop around the age 27.  eSigned:  Ladene Artist, MD, [facility] 05/15/2015 10:52 AM

## 2015-05-18 ENCOUNTER — Telehealth: Payer: Self-pay | Admitting: *Deleted

## 2015-05-18 NOTE — Telephone Encounter (Signed)
  Follow up Call-  Call back number 05/15/2015 05/15/2015  Post procedure Call Back phone  # Everlene Farrier caregiver [phone]  Phone comments Don't call home number! -  Permission to leave phone message - Yes     Patient questions:  Do you have a fever, pain , or abdominal swelling? No. Pain Score  0 *  Have you tolerated food without any problems? Yes.    Have you been able to return to your normal activities? Yes.    Do you have any questions about your discharge instructions: Diet   No. Medications  No. Follow up visit  No.  Do you have questions or concerns about your Care? No.  Actions: * If pain score is 4 or above: No action needed, pain <4.  Information provided via care giver.

## 2015-05-26 ENCOUNTER — Encounter: Payer: Self-pay | Admitting: Gastroenterology

## 2015-05-27 DIAGNOSIS — Z79899 Other long term (current) drug therapy: Secondary | ICD-10-CM | POA: Diagnosis not present

## 2015-05-27 DIAGNOSIS — M25561 Pain in right knee: Secondary | ICD-10-CM | POA: Diagnosis not present

## 2015-05-27 DIAGNOSIS — M810 Age-related osteoporosis without current pathological fracture: Secondary | ICD-10-CM | POA: Diagnosis not present

## 2015-05-27 DIAGNOSIS — Z6821 Body mass index (BMI) 21.0-21.9, adult: Secondary | ICD-10-CM | POA: Diagnosis not present

## 2015-05-28 DIAGNOSIS — W010XXA Fall on same level from slipping, tripping and stumbling without subsequent striking against object, initial encounter: Secondary | ICD-10-CM | POA: Diagnosis not present

## 2015-05-28 DIAGNOSIS — M25561 Pain in right knee: Secondary | ICD-10-CM | POA: Diagnosis not present

## 2015-05-28 DIAGNOSIS — Z6821 Body mass index (BMI) 21.0-21.9, adult: Secondary | ICD-10-CM | POA: Diagnosis not present

## 2015-05-29 ENCOUNTER — Other Ambulatory Visit: Payer: Self-pay | Admitting: Internal Medicine

## 2015-06-05 ENCOUNTER — Other Ambulatory Visit (HOSPITAL_COMMUNITY): Payer: Self-pay | Admitting: *Deleted

## 2015-06-05 DIAGNOSIS — Z87891 Personal history of nicotine dependence: Secondary | ICD-10-CM | POA: Diagnosis not present

## 2015-06-05 DIAGNOSIS — Z9181 History of falling: Secondary | ICD-10-CM | POA: Diagnosis not present

## 2015-06-05 DIAGNOSIS — F0390 Unspecified dementia without behavioral disturbance: Secondary | ICD-10-CM | POA: Diagnosis not present

## 2015-06-05 DIAGNOSIS — M25561 Pain in right knee: Secondary | ICD-10-CM | POA: Diagnosis not present

## 2015-06-05 DIAGNOSIS — M160 Bilateral primary osteoarthritis of hip: Secondary | ICD-10-CM | POA: Diagnosis not present

## 2015-06-08 ENCOUNTER — Ambulatory Visit (HOSPITAL_COMMUNITY)
Admission: RE | Admit: 2015-06-08 | Discharge: 2015-06-08 | Disposition: A | Discharge status: Home / Self Care | Payer: Medicare Other | Source: Ambulatory Visit | Attending: Internal Medicine | Admitting: Internal Medicine

## 2015-06-08 DIAGNOSIS — M810 Age-related osteoporosis without current pathological fracture: Secondary | ICD-10-CM | POA: Insufficient documentation

## 2015-06-08 MED ORDER — ZOLEDRONIC ACID 5 MG/100ML IV SOLN
INTRAVENOUS | Status: AC
  Filled 2015-06-08: qty 100

## 2015-06-08 MED ORDER — ZOLEDRONIC ACID 5 MG/100ML IV SOLN
5.0000 mg | Freq: Once | INTRAVENOUS | Status: AC
  Administered 2015-06-08: 5 mg via INTRAVENOUS

## 2015-06-16 ENCOUNTER — Telehealth: Payer: Self-pay | Admitting: Gastroenterology

## 2015-06-16 NOTE — Telephone Encounter (Signed)
All questions about pathology letter answered.  She will call back for any additional questions or concerns.  

## 2015-06-23 DIAGNOSIS — M25562 Pain in left knee: Secondary | ICD-10-CM | POA: Diagnosis not present

## 2015-06-23 DIAGNOSIS — M25561 Pain in right knee: Secondary | ICD-10-CM | POA: Diagnosis not present

## 2015-06-29 ENCOUNTER — Other Ambulatory Visit: Payer: Self-pay | Admitting: Internal Medicine

## 2015-07-03 ENCOUNTER — Telehealth: Payer: Self-pay | Admitting: *Deleted

## 2015-07-03 MED ORDER — ROSUVASTATIN CALCIUM 40 MG PO TABS: 40.0000 mg | ORAL_TABLET | Freq: Every day | ORAL | Status: DC

## 2015-07-03 NOTE — Telephone Encounter (Signed)
Caregiver called stating pharmacy should have have gotten in touch with office -to refill generic Crestor Informed caregiver, no record of request Will send refill request by e-scribe

## 2015-07-31 DIAGNOSIS — M25561 Pain in right knee: Secondary | ICD-10-CM | POA: Diagnosis not present

## 2015-07-31 DIAGNOSIS — F0390 Unspecified dementia without behavioral disturbance: Secondary | ICD-10-CM | POA: Diagnosis not present

## 2015-07-31 DIAGNOSIS — Z9181 History of falling: Secondary | ICD-10-CM | POA: Diagnosis not present

## 2015-07-31 DIAGNOSIS — M160 Bilateral primary osteoarthritis of hip: Secondary | ICD-10-CM | POA: Diagnosis not present

## 2015-07-31 DIAGNOSIS — Z87891 Personal history of nicotine dependence: Secondary | ICD-10-CM | POA: Diagnosis not present

## 2015-08-05 ENCOUNTER — Telehealth: Payer: Self-pay | Admitting: Cardiology

## 2015-08-05 NOTE — Telephone Encounter (Signed)
1. Type of surgery: left hip: THA w/wo autograft/allograft-AA 2. Date of surgery: Sep 01, 2015 3. Surgeon: Dr. Paralee Cancel 4. Medications that need to be held & how long: none specified - patient takes asprin 81mg   5. Fax and/or Phone: (p) [phone]  (f) [phone]  - Orson Slick surgical scheduler  Routed to Dr. Stanford Breed

## 2015-08-05 NOTE — Telephone Encounter (Signed)
Will need pa eval preop Kirk Ruths

## 2015-08-05 NOTE — Telephone Encounter (Signed)
Patient scheduled for appt with Angelica Ran, NP 08/11/15 @ 2pm @ [facility]

## 2015-08-10 DIAGNOSIS — M1612 Unilateral primary osteoarthritis, left hip: Secondary | ICD-10-CM | POA: Diagnosis not present

## 2015-08-11 ENCOUNTER — Ambulatory Visit (INDEPENDENT_AMBULATORY_CARE_PROVIDER_SITE_OTHER): Payer: Medicare Other | Admitting: Nurse Practitioner

## 2015-08-11 ENCOUNTER — Encounter: Payer: Self-pay | Admitting: Nurse Practitioner

## 2015-08-11 VITALS — BP 108/68 | HR 65 | Ht 59.0 in | Wt 109.6 lb

## 2015-08-11 DIAGNOSIS — I10 Essential (primary) hypertension: Secondary | ICD-10-CM | POA: Diagnosis not present

## 2015-08-11 DIAGNOSIS — E782 Mixed hyperlipidemia: Secondary | ICD-10-CM | POA: Insufficient documentation

## 2015-08-11 DIAGNOSIS — Z01818 Encounter for other preprocedural examination: Secondary | ICD-10-CM | POA: Diagnosis not present

## 2015-08-11 DIAGNOSIS — E785 Hyperlipidemia, unspecified: Secondary | ICD-10-CM | POA: Diagnosis not present

## 2015-08-11 NOTE — Progress Notes (Signed)
Office Visit    Patient Name: Margaret [name] Date of Encounter: 08/11/2015  Primary Care Provider:  Marton Redwood, MD Primary Cardiologist:  B. Stanford Breed, MD   Chief Complaint    81 year old female with a history of hypertension and hyperlipidemia who presents for preoperative evaluation.  Past Medical History    Past Medical History  Diagnosis Date  . Essential hypertension     a.  02/2007 Echo: Nl EF, triv AI.  Marland Kitchen Mixed hyperlipidemia   . Osteopenia   . Lichen sclerosus   . Disturbances of sensation of smell and taste   . Lipoma of face   . Other persistent mental disorders due to conditions classified elsewhere   . Memory loss    Past Surgical History  Procedure Laterality Date  . Tonsilectomy, adenoidectomy, bilateral myringotomy and tubes    . Oophorectomy  1988    DIAG LAP W BSO  . Cataract extraction, bilateral      Allergies  Allergies  Allergen Reactions  . Sulfa Antibiotics     History of Present Illness    81 year old female with a history of hypertension and hyperlipidemia. She previously had normal LV function by echocardiogram in 2008. She also has a history of dementia and has a health care assistant with her pretty much at all times. She does live locally and over the years has been reasonably active. Her activity has been cut some over the past 3-6 months secondary to progressive left hip discomfort. She has been seen by orthopedic surgery with recommendation for total hip arthroplasty. She presents today for preoperative evaluation. She is still able to walk approximately 6 blocks total over about 30 minutes without experiencing chest pain or dyspnea. The only limitation has been her left hip discomfort. On days that she does not walk, her health assistant has her do calisthenics and other exercises using bands. They typically do this for about 30 minutes every day.  She tolerates these exercises well and does not experience chest pain or  significant dyspnea. She denies PND, orthopnea, dizziness, syncope, edema, or early satiety.  Home Medications    Prior to Admission medications   Medication Sig Start Date End Date Taking? Authorizing Provider  acetaminophen (TYLENOL) 650 MG CR tablet Take 650 mg by mouth as needed. Take one as needed   Yes Historical Provider, MD  aspirin 81 MG tablet 81 mg. Pt only taking when she remembers.   Yes Historical Provider, MD  Calcium Carbonate-Vit D-Min (CALCIUM 1200 PO) Take 1 tablet by mouth daily.    Yes Historical Provider, MD  cetirizine (ZYRTEC) 10 MG tablet Take 1 tablet (10 mg total) by mouth daily. Patient taking differently: Take 10 mg by mouth as needed.  09/17/14  Yes Monica Carter, DO  escitalopram (LEXAPRO) 5 MG tablet TAKE 1 TABLET BY MOUTH DAILY*CALL OFFICE TO MAKE APPOINTMENT* 05/29/15  Yes Tiffany L Reed, DO  galantamine (RAZADYNE ER) 24 MG 24 hr capsule TAKE 1 CAPSULE BY MOUTH EVERY DAY 01/05/15  Yes Tiffany L Reed, DO  NAMENDA XR 28 MG CP24 24 hr capsule TAKE 1 CAPSULE(28 MG) BY MOUTH DAILY 01/28/15  Yes Tiffany L Reed, DO  ranitidine (PX RANITIDINE) 75 MG tablet Take 1 tablet (75 mg total) by mouth at bedtime. Patient taking differently: Take 75 mg by mouth as needed.  07/17/13  Yes Claudette Jeri Cos, NP  rosuvastatin (CRESTOR) 40 MG tablet Take 1 tablet (40 mg total) by mouth daily. 07/03/15  Yes Denice Bors  Crenshaw, MD  traMADol (ULTRAM) 50 MG tablet Take one tablet by mouth three times daily as needed for moderate to severe pain Patient taking differently: as needed. Take one tablet by mouth three times daily as needed for moderate to severe pain 09/23/14  Yes Estill Dooms, MD  valsartan (DIOVAN) 160 MG tablet TAKE 1 TABLET(160 MG) BY MOUTH DAILY 09/26/14  Yes Tiffany L Reed, DO  VOLTAREN 1 % GEL APPLY 4 GRAMS TO RIGHT KNEE QID PRN P 05/27/15  Yes Historical Provider, MD    Review of Systems    As above, patient has been doing well from a cardiovascular standpoint however her  activity is somewhat limited by left hip discomfort. She denies chest pain, palpitations, dyspnea, PND, orthopnea, dizziness, syncope, edema, or early satiety.  All other systems reviewed and are otherwise negative except as noted above.  Physical Exam    VS:  BP 108/68 mmHg  Pulse 65  Ht 4\' 11"  (1.499 m)  Wt 109 lb 9.6 oz (49.714 kg)  BMI 22.12 kg/m2 , BMI Body mass index is 22.12 kg/(m^2). GEN: Well nourished, well developed, in no acute distress. HEENT: normal. Neck: Supple, no JVD, carotid bruits, or masses. Cardiac: RRR, no rubs, or gallops. She has a 2/6 systolic ejection murmur at the right upper sternal border.  No clubbing, cyanosis, edema.  Radials/DP/PT 2+ and equal bilaterally.  Respiratory:  Respirations regular and unlabored, clear to auscultation bilaterally. GI: Soft, nontender, nondistended, BS + x 4. MS: no deformity or atrophy. Skin: warm and dry, no rash. Neuro:  Strength and sensation are intact. Psych: Normal affect.  Accessory Clinical Findings    ECG - sinus bradycardia, 59, no acute ST or T changes.  Assessment & Plan    1.  Preoperative evaluation/osteoarthritis of the left hip: Patient is pending left total hip arthroplasty. Activity limitations are only those related to left hip discomfort at this point. She is able to walk or exercise for approximately 30 minutes without chest pain or dyspnea.her ECG is normal and vital signs are stable. She will not require any additional cardiovascular testing prior to her surgery and is felt to be low risk for cardiac complication. She should remain on statin therapy throughout the perioperative period.  2. Essential hypertension: Blood pressure is stable on ARB therapy.  3. Hyperlipidemia: Continue statin therapy throughout perioperative period.  4.  Disposition: Follow-up with Dr. Stanford Breed in one year or sooner if necessary.   Murray Hodgkins, NP 08/11/2015, 5:26 PM

## 2015-08-11 NOTE — Patient Instructions (Signed)
Your physician wants you to follow-up in: ONE YEAR WITH DR CRENSHAW You will receive a reminder letter in the mail two months in advance. If you don't receive a letter, please call our office to schedule the follow-up appointment.   If you need a refill on your cardiac medications before your next appointment, please call your pharmacy.  

## 2015-08-13 NOTE — H&P (Signed)
**Note Margaret-Identified via Obfuscation** TOTAL HIP ADMISSION H&P  Patient is admitted for left total hip arthroplasty, anterior approach.  Subjective:  Chief Complaint:   Left hip primary OA / pain  HPI: Margaret [name], 81 y.o. female, has a history of pain and functional disability in the left hip(s) due to arthritis and patient has failed non-surgical conservative treatments for greater than 12 weeks to include NSAID's and/or analgesics and activity modification.  Onset of symptoms was gradual starting years ago with gradually worsening course since that time.The patient noted no past surgery on the left hip(s).  Patient currently rates pain in the left hip at 9 out of 10 with activity. Patient has night pain, worsening of pain with activity and weight bearing, trendelenberg gait, pain that interfers with activities of daily living and pain with passive range of motion. Patient has evidence of periarticular osteophytes and joint space narrowing by imaging studies. This condition presents safety issues increasing the risk of falls.  There is no current active infection.   Risks, benefits and expectations were discussed with the patient.  Risks including but not limited to the risk of anesthesia, blood clots, nerve damage, blood vessel damage, failure of the prosthesis, infection and up to and including death.  Patient understand the risks, benefits and expectations and wishes to proceed with surgery.   PCP: Marton Redwood, MD  D/C Plans:      Home with HHPT  Post-op Meds:       No Rx given  Tranexamic Acid:      To be given - IV   Decadron:      Is to be given  FYI:     ASA  Norco     Patient Active Problem List   Diagnosis Date Noted  . Mixed hyperlipidemia   . RUQ abdominal pain 02/23/2015  . Nausea without vomiting 02/23/2015  . Elevated LFTs 02/23/2015  . Loss of appetite 02/23/2015  . Dementia with behavioral disturbance 02/04/2015  . Abdominal pain 02/04/2015  . Leaking of urine 02/04/2015  . Dyspepsia  07/17/2013  . Mild cognitive impairment 09/21/2012  . Poison ivy dermatitis 08/27/2012  . Osteopenia   . Lichen sclerosus   . ARM PAIN 06/30/2010  . Hyperlipidemia 07/19/2008  . Essential hypertension 07/19/2008  . BRUIT 07/19/2008   Past Medical History  Diagnosis Date  . Essential hypertension     a.  02/2007 Echo: Nl EF, triv AI.  Marland Kitchen Mixed hyperlipidemia   . Osteopenia   . Lichen sclerosus   . Disturbances of sensation of smell and taste   . Lipoma of face   . Other persistent mental disorders due to conditions classified elsewhere   . Memory loss     Past Surgical History  Procedure Laterality Date  . Tonsilectomy, adenoidectomy, bilateral myringotomy and tubes    . Oophorectomy  1988    DIAG LAP W BSO  . Cataract extraction, bilateral      No prescriptions prior to admission   Allergies  Allergen Reactions  . Sulfa Antibiotics     Social History  Substance Use Topics  . Smoking status: Former Smoker    Quit date: 09/13/2002  . Smokeless tobacco: Never Used  . Alcohol Use: 12.6 oz/week    7 Standard drinks or equivalent, 14 Glasses of wine per week     Comment: 2 glasses of wine each evening    Family History  Problem Relation Age of Onset  . Cancer Mother     OVARIAN  .  Diabetes Father   . Hypertension Father      Review of Systems  Constitutional: Negative.   HENT: Negative.   Eyes: Negative.   Respiratory: Negative.   Cardiovascular: Negative.   Gastrointestinal: Negative.   Genitourinary: Positive for frequency.  Musculoskeletal: Positive for joint pain.  Skin: Negative.   Neurological: Negative.   Endo/Heme/Allergies: Negative.   Psychiatric/Behavioral: Positive for memory loss.    Objective:  Physical Exam  Constitutional: She appears well-developed.  HENT:  Head: Normocephalic.  Eyes: Pupils are equal, round, and reactive to light.  Neck: Neck supple. No JVD present. No tracheal deviation present. No thyromegaly present.   Cardiovascular: Normal rate, regular rhythm, normal heart sounds and intact distal pulses.   Respiratory: Effort normal and breath sounds normal. No stridor. No respiratory distress. She has no wheezes.  GI: Soft. There is no tenderness. There is no guarding.  Musculoskeletal:       Left hip: She exhibits decreased range of motion, decreased strength, tenderness and bony tenderness. She exhibits no swelling, no deformity and no laceration.  Lymphadenopathy:    She has no cervical adenopathy.  Neurological: She is alert.  Skin: Skin is warm and dry.  Psychiatric: She has a normal mood and affect.     Labs:  Estimated body mass index is 21.60 kg/(m^2) as calculated from the following:   Height as of 06/08/15: 4\' 11"  (1.499 m).   Weight as of 06/08/15: 48.535 kg (107 lb).   Imaging Review Plain radiographs demonstrate severe degenerative joint disease of the left hip(s). The bone quality appears to be good for age and reported activity level.  Assessment/Plan:  End stage arthritis, left hip(s)  The patient history, physical examination, clinical judgement of the provider and imaging studies are consistent with end stage degenerative joint disease of the left hip(s) and total hip arthroplasty is deemed medically necessary. The treatment options including medical management, injection therapy, arthroscopy and arthroplasty were discussed at length. The risks and benefits of total hip arthroplasty were presented and reviewed. The risks due to aseptic loosening, infection, stiffness, dislocation/subluxation,  thromboembolic complications and other imponderables were discussed.  The patient acknowledged the explanation, agreed to proceed with the plan and consent was signed. Patient is being admitted for inpatient treatment for surgery, pain control, PT, OT, prophylactic antibiotics, VTE prophylaxis, progressive ambulation and ADL's and discharge planning.The patient is planning to be discharged  home with home health services.      [place] Pugh Bayli Quesinberry   PA-C  08/13/2015, 9:51 PM

## 2015-08-21 ENCOUNTER — Encounter (HOSPITAL_COMMUNITY)
Admission: RE | Admit: 2015-08-21 | Discharge: 2015-08-21 | Disposition: A | Discharge status: Home / Self Care | Payer: Medicare Other | Source: Ambulatory Visit | Attending: Orthopedic Surgery | Admitting: Orthopedic Surgery

## 2015-08-21 ENCOUNTER — Encounter (HOSPITAL_COMMUNITY): Payer: Self-pay

## 2015-08-21 DIAGNOSIS — M1612 Unilateral primary osteoarthritis, left hip: Secondary | ICD-10-CM | POA: Diagnosis not present

## 2015-08-21 DIAGNOSIS — Z01812 Encounter for preprocedural laboratory examination: Secondary | ICD-10-CM | POA: Insufficient documentation

## 2015-08-21 HISTORY — DX: Unspecified osteoarthritis, unspecified site: M19.90

## 2015-08-21 HISTORY — DX: Gastro-esophageal reflux disease without esophagitis: K21.9

## 2015-08-21 LAB — SURGICAL PCR SCREEN
MRSA, PCR: NEGATIVE
Staphylococcus aureus: NEGATIVE

## 2015-08-21 LAB — ABO/RH: ABO/RH(D): B POS

## 2015-08-21 LAB — BASIC METABOLIC PANEL
Anion gap: 10 (ref 5–15)
BUN: 30 mg/dL — ABNORMAL HIGH (ref 6–20)
CHLORIDE: 111 mmol/L (ref 101–111)
CO2: 24 mmol/L (ref 22–32)
Calcium: 9.4 mg/dL (ref 8.9–10.3)
Creatinine, Ser: 0.6 mg/dL (ref 0.44–1.00)
GFR calc non Af Amer: >60 mL/min (ref >60)
Glucose, Bld: 94 mg/dL (ref 65–99)
POTASSIUM: 3.9 mmol/L (ref 3.5–5.1)
SODIUM: 145 mmol/L (ref 135–145)

## 2015-08-21 LAB — CBC
HEMATOCRIT: 38.8 % (ref 36.0–46.0)
Hemoglobin: 12.7 g/dL (ref 12.0–15.0)
MCH: 32.6 pg (ref 26.0–34.0)
MCHC: 32.7 g/dL (ref 30.0–36.0)
MCV: 99.5 fL (ref 78.0–100.0)
Platelets: 328 10*3/uL (ref 150–400)
RBC: 3.9 MIL/uL (ref 3.87–5.11)
RDW: 13.7 % (ref 11.5–15.5)
WBC: 11.4 10*3/uL — AB (ref 4.0–10.5)

## 2015-08-21 NOTE — Progress Notes (Signed)
08-21-15 - BMP lab results from preop visit on 08-21-15 faxed to Dr. Alvan Dame via [facility]

## 2015-08-21 NOTE — Progress Notes (Signed)
08-11-15 - LOV - C. Sharolyn Douglas, NP (cardio) & cardiac clearance - EPIC Surgical clearance - Dr. Brigitte Pulse (PCP) - in chart 08-11-15 - EKG - EPIC  03-27-15 - CT ABD/Pelvis - EPIC

## 2015-08-21 NOTE — Patient Instructions (Addendum)
PASCHA PARMER  08/21/2015   Your procedure is scheduled on: 09-01-15  Report to [facility] Main  Entrance take [facility]  elevators to 3rd floor to  [place] at 8:30 AM.  Call this number if you have problems the morning of surgery [phone]   Remember: ONLY 1 PERSON MAY GO WITH YOU TO SHORT STAY TO GET  READY MORNING OF [place].  Do not eat food or drink liquids :After Midnight.     Take these medicines the morning of surgery with A SIP OF WATER: Zyrtec in needed, Escitalopram (Lexapro), Galantamine (Razadyne), Namenda XR DO NOT TAKE ANY DIABETIC MEDICATIONS DAY OF YOUR SURGERY                               You may not have any metal on your body including hair pins and              piercings  Do not wear jewelry, make-up, lotions, powders or perfumes, deodorant             Do not wear nail polish.  Do not shave  48 hours prior to surgery.           Do not bring valuables to the hospital. [place].  Contacts, dentures or bridgework may not be worn into surgery.  Leave suitcase in the car. After surgery it may be brought to your room.       Special Instructions: coughing and deep breathing exercises, leg exercises              Please read over the following fact sheets you were given: _____________________________________________________________________             [facility] - Preparing for Surgery Before surgery, you can play an important role.  Because skin is not sterile, your skin needs to be as free of germs as possible.  You can reduce the number of germs on your skin by washing with CHG (chlorahexidine gluconate) soap before surgery.  CHG is an antiseptic cleaner which kills germs and bonds with the skin to continue killing germs even after washing. Please DO NOT use if you have an allergy to CHG or antibacterial soaps.  If your skin becomes reddened/irritated stop using the CHG  and inform your nurse when you arrive at Short Stay. Do not shave (including legs and underarms) for at least 48 hours prior to the first CHG shower.  You may shave your face/neck. Please follow these instructions carefully:  1.  Shower with CHG Soap the night before surgery and the  morning of Surgery.  2.  If you choose to wash your hair, wash your hair first as usual with your  normal  shampoo.  3.  After you shampoo, rinse your hair and body thoroughly to remove the  shampoo.                           4.  Use CHG as you would any other liquid soap.  You can apply chg directly  to the skin and wash  Gently with a scrungie or clean washcloth.  5.  Apply the CHG Soap to your body ONLY FROM THE NECK DOWN.   Do not use on face/ open                           Wound or open sores. Avoid contact with eyes, ears mouth and genitals (private parts).                       Wash face,  Genitals (private parts) with your normal soap.             6.  Wash thoroughly, paying special attention to the area where your surgery  will be performed.  7.  Thoroughly rinse your body with warm water from the neck down.  8.  DO NOT shower/wash with your normal soap after using and rinsing off  the CHG Soap.                9.  Pat yourself dry with a clean towel.            10.  Wear clean pajamas.            11.  Place clean sheets on your bed the night of your first shower and do not  sleep with pets. Day of Surgery : Do not apply any lotions/deodorants the morning of surgery.  Please wear clean clothes to the hospital/surgery center.  FAILURE TO FOLLOW THESE INSTRUCTIONS MAY RESULT IN THE CANCELLATION OF YOUR SURGERY PATIENT SIGNATURE_________________________________  NURSE SIGNATURE__________________________________  ________________________________________________________________________  WHAT IS A BLOOD TRANSFUSION? Blood Transfusion Information  A transfusion is the replacement of  blood or some of its parts. Blood is made up of multiple cells which provide different functions.  Red blood cells carry oxygen and are used for blood loss replacement.  White blood cells fight against infection.  Platelets control bleeding.  Plasma helps clot blood.  Other blood products are available for specialized needs, such as hemophilia or other clotting disorders. BEFORE THE TRANSFUSION  Who gives blood for transfusions?   Healthy volunteers who are fully evaluated to make sure their blood is safe. This is blood bank blood. Transfusion therapy is the safest it has ever been in the practice of medicine. Before blood is taken from a donor, a complete history is taken to make sure that person has no history of diseases nor engages in risky social behavior (examples are intravenous drug use or sexual activity with multiple partners). The donor's travel history is screened to minimize risk of transmitting infections, such as malaria. The donated blood is tested for signs of infectious diseases, such as HIV and hepatitis. The blood is then tested to be sure it is compatible with you in order to minimize the chance of a transfusion reaction. If you or a relative donates blood, this is often done in anticipation of surgery and is not appropriate for emergency situations. It takes many days to process the donated blood. RISKS AND COMPLICATIONS Although transfusion therapy is very safe and saves many lives, the main dangers of transfusion include:  1. Getting an infectious disease. 2. Developing a transfusion reaction. This is an allergic reaction to something in the blood you were given. Every precaution is taken to prevent this. The decision to have a blood transfusion has been considered carefully by your caregiver before blood is given. Blood is not given unless the benefits outweigh  the risks. AFTER THE TRANSFUSION  Right after receiving a blood transfusion, you will usually feel much  better and more energetic. This is especially true if your red blood cells have gotten low (anemic). The transfusion raises the level of the red blood cells which carry oxygen, and this usually causes an energy increase.  The nurse administering the transfusion will monitor you carefully for complications. HOME CARE INSTRUCTIONS  No special instructions are needed after a transfusion. You may find your energy is better. Speak with your caregiver about any limitations on activity for underlying diseases you may have. SEEK MEDICAL CARE IF:   Your condition is not improving after your transfusion.  You develop redness or irritation at the intravenous (IV) site. SEEK IMMEDIATE MEDICAL CARE IF:  Any of the following symptoms occur over the next 12 hours:  Shaking chills.  You have a temperature by mouth above 102 F (38.9 C), not controlled by medicine.  Chest, back, or muscle pain.  People around you feel you are not acting correctly or are confused.  Shortness of breath or difficulty breathing.  Dizziness and fainting.  You get a rash or develop hives.  You have a decrease in urine output.  Your urine turns a dark color or changes to pink, red, or brown. Any of the following symptoms occur over the next 10 days:  You have a temperature by mouth above 102 F (38.9 C), not controlled by medicine.  Shortness of breath.  Weakness after normal activity.  The white part of the eye turns yellow (jaundice).  You have a decrease in the amount of urine or are urinating less often.  Your urine turns a dark color or changes to pink, red, or brown. Document Released: 04/08/2000 Document Revised: 07/04/2011 Document Reviewed: 11/26/2007 ExitCare Patient Information 2014 [place].  _______________________________________________________________________  Incentive Spirometer  An incentive spirometer is a tool that can help keep your lungs clear and active. This tool measures  how well you are filling your lungs with each breath. Taking long deep breaths may help reverse or decrease the chance of developing breathing (pulmonary) problems (especially infection) following:  A long period of time when you are unable to move or be active. BEFORE THE PROCEDURE   If the spirometer includes an indicator to show your best effort, your nurse or respiratory therapist will set it to a desired goal.  If possible, sit up straight or lean slightly forward. Try not to slouch.  Hold the incentive spirometer in an upright position. INSTRUCTIONS FOR USE  3. Sit on the edge of your bed if possible, or sit up as far as you can in bed or on a chair. 4. Hold the incentive spirometer in an upright position. 5. Breathe out normally. 6. Place the mouthpiece in your mouth and seal your lips tightly around it. 7. Breathe in slowly and as deeply as possible, raising the piston or the ball toward the top of the column. 8. Hold your breath for 3-5 seconds or for as long as possible. Allow the piston or ball to fall to the bottom of the column. 9. Remove the mouthpiece from your mouth and breathe out normally. 10. Rest for a few seconds and repeat Steps 1 through 7 at least 10 times every 1-2 hours when you are awake. Take your time and take a few normal breaths between deep breaths. 11. The spirometer may include an indicator to show your best effort. Use the indicator as a goal to work  toward during each repetition. 12. After each set of 10 deep breaths, practice coughing to be sure your lungs are clear. If you have an incision (the cut made at the time of surgery), support your incision when coughing by placing a pillow or rolled up towels firmly against it. Once you are able to get out of bed, walk around indoors and cough well. You may stop using the incentive spirometer when instructed by your caregiver.  RISKS AND COMPLICATIONS  Take your time so you do not get dizzy or  light-headed.  If you are in pain, you may need to take or ask for pain medication before doing incentive spirometry. It is harder to take a deep breath if you are having pain. AFTER USE  Rest and breathe slowly and easily.  It can be helpful to keep track of a log of your progress. Your caregiver can provide you with a simple table to help with this. If you are using the spirometer at home, follow these instructions: [place] IF:   You are having difficultly using the spirometer.  You have trouble using the spirometer as often as instructed.  Your pain medication is not giving enough relief while using the spirometer.  You develop fever of 100.5 F (38.1 C) or higher. SEEK IMMEDIATE MEDICAL CARE IF:   You cough up bloody sputum that had not been present before.  You develop fever of 102 F (38.9 C) or greater.  You develop worsening pain at or near the incision site. MAKE SURE YOU:   Understand these instructions.  Will watch your condition.  Will get help right away if you are not doing well or get worse. Document Released: 08/22/2006 Document Revised: 07/04/2011 Document Reviewed: 10/23/2006 [facility] Patient Information 2014 [place], [place].   ________________________________________________________________________

## 2015-08-24 ENCOUNTER — Other Ambulatory Visit: Payer: Self-pay | Admitting: Internal Medicine

## 2015-09-01 ENCOUNTER — Inpatient Hospital Stay (HOSPITAL_COMMUNITY): Payer: Medicare Other | Admitting: Certified Registered Nurse Anesthetist

## 2015-09-01 ENCOUNTER — Inpatient Hospital Stay (HOSPITAL_COMMUNITY): Payer: Medicare Other

## 2015-09-01 ENCOUNTER — Encounter (HOSPITAL_COMMUNITY)
Admission: RE | Disposition: A | Discharge status: Home Health | Payer: Self-pay | Source: Ambulatory Visit | Attending: Orthopedic Surgery

## 2015-09-01 ENCOUNTER — Encounter (HOSPITAL_COMMUNITY): Payer: Self-pay | Admitting: *Deleted

## 2015-09-01 ENCOUNTER — Inpatient Hospital Stay (HOSPITAL_COMMUNITY)
Admission: RE | Admit: 2015-09-01 | Discharge: 2015-09-02 | DRG: 470 | Disposition: A | Discharge status: Home Health | Payer: Medicare Other | Source: Ambulatory Visit | Attending: Orthopedic Surgery | Admitting: Orthopedic Surgery

## 2015-09-01 DIAGNOSIS — Z87891 Personal history of nicotine dependence: Secondary | ICD-10-CM

## 2015-09-01 DIAGNOSIS — I10 Essential (primary) hypertension: Secondary | ICD-10-CM | POA: Diagnosis present

## 2015-09-01 DIAGNOSIS — M1612 Unilateral primary osteoarthritis, left hip: Principal | ICD-10-CM | POA: Diagnosis present

## 2015-09-01 DIAGNOSIS — F0390 Unspecified dementia without behavioral disturbance: Secondary | ICD-10-CM | POA: Diagnosis present

## 2015-09-01 DIAGNOSIS — K219 Gastro-esophageal reflux disease without esophagitis: Secondary | ICD-10-CM | POA: Diagnosis present

## 2015-09-01 DIAGNOSIS — Z96649 Presence of unspecified artificial hip joint: Secondary | ICD-10-CM

## 2015-09-01 DIAGNOSIS — Z471 Aftercare following joint replacement surgery: Secondary | ICD-10-CM | POA: Diagnosis not present

## 2015-09-01 DIAGNOSIS — Z96642 Presence of left artificial hip joint: Secondary | ICD-10-CM | POA: Diagnosis not present

## 2015-09-01 DIAGNOSIS — M25552 Pain in left hip: Secondary | ICD-10-CM | POA: Diagnosis not present

## 2015-09-01 HISTORY — PX: TOTAL HIP ARTHROPLASTY: SHX124

## 2015-09-01 LAB — TYPE AND SCREEN
ABO/RH(D): B POS
Antibody Screen: NEGATIVE

## 2015-09-01 SURGERY — ARTHROPLASTY, HIP, TOTAL, ANTERIOR APPROACH
Anesthesia: Spinal | Site: Hip | Laterality: Left

## 2015-09-01 MED ORDER — BISACODYL 10 MG RE SUPP: 10.0000 mg | Freq: Every day | RECTAL | Status: DC | PRN

## 2015-09-01 MED ORDER — CEFAZOLIN SODIUM-DEXTROSE 2-4 GM/100ML-% IV SOLN
2.0000 g | INTRAVENOUS | Status: AC
  Administered 2015-09-01: 2 g via INTRAVENOUS
  Filled 2015-09-01: qty 100

## 2015-09-01 MED ORDER — METHOCARBAMOL 1000 MG/10ML IJ SOLN
500.0000 mg | Freq: Four times a day (QID) | INTRAVENOUS | Status: DC | PRN
  Filled 2015-09-01: qty 5

## 2015-09-01 MED ORDER — ONDANSETRON HCL 4 MG PO TABS: 4.0000 mg | ORAL_TABLET | Freq: Four times a day (QID) | ORAL | Status: DC | PRN

## 2015-09-01 MED ORDER — ROSUVASTATIN CALCIUM 20 MG PO TABS
40.0000 mg | ORAL_TABLET | Freq: Every day | ORAL | Status: DC
  Administered 2015-09-01 – 2015-09-02 (×2): 40 mg via ORAL
  Filled 2015-09-01 (×2): qty 2

## 2015-09-01 MED ORDER — MEMANTINE HCL ER 28 MG PO CP24
28.0000 mg | ORAL_CAPSULE | Freq: Every day | ORAL | Status: DC
  Administered 2015-09-02: 28 mg via ORAL
  Filled 2015-09-01: qty 1

## 2015-09-01 MED ORDER — TRANEXAMIC ACID 1000 MG/10ML IV SOLN
1000.0000 mg | Freq: Once | INTRAVENOUS | Status: AC
  Administered 2015-09-01: 1000 mg via INTRAVENOUS
  Filled 2015-09-01: qty 10

## 2015-09-01 MED ORDER — PROPOFOL 10 MG/ML IV BOLUS
INTRAVENOUS | Status: AC
  Filled 2015-09-01: qty 60

## 2015-09-01 MED ORDER — DEXAMETHASONE SODIUM PHOSPHATE 10 MG/ML IJ SOLN
INTRAMUSCULAR | Status: AC
  Filled 2015-09-01: qty 1

## 2015-09-01 MED ORDER — EPHEDRINE SULFATE 50 MG/ML IJ SOLN
INTRAMUSCULAR | Status: DC | PRN
  Administered 2015-09-01 (×4): 5 mg via INTRAVENOUS

## 2015-09-01 MED ORDER — HYDROMORPHONE HCL 1 MG/ML IJ SOLN
0.5000 mg | INTRAMUSCULAR | Status: DC | PRN
  Administered 2015-09-01: 0.5 mg via INTRAVENOUS
  Filled 2015-09-01: qty 1

## 2015-09-01 MED ORDER — HYDROCODONE-ACETAMINOPHEN 7.5-325 MG PO TABS
1.0000 | ORAL_TABLET | ORAL | Status: DC
  Administered 2015-09-01 (×3): 1 via ORAL
  Administered 2015-09-02: 2 via ORAL
  Administered 2015-09-02: 1 via ORAL
  Administered 2015-09-02: 2 via ORAL
  Administered 2015-09-02: 1 via ORAL
  Filled 2015-09-01 (×2): qty 1
  Filled 2015-09-01 (×3): qty 2
  Filled 2015-09-01 (×2): qty 1

## 2015-09-01 MED ORDER — FAMOTIDINE 20 MG PO TABS: 10.0000 mg | ORAL_TABLET | Freq: Two times a day (BID) | ORAL | Status: DC | PRN

## 2015-09-01 MED ORDER — LACTATED RINGERS IV SOLN
INTRAVENOUS | Status: DC
  Administered 2015-09-01: 12:00:00 via INTRAVENOUS
  Administered 2015-09-01: 1000 mL via INTRAVENOUS

## 2015-09-01 MED ORDER — ONDANSETRON HCL 4 MG/2ML IJ SOLN
INTRAMUSCULAR | Status: AC
  Filled 2015-09-01: qty 2

## 2015-09-01 MED ORDER — ALUM & MAG HYDROXIDE-SIMETH 200-200-20 MG/5ML PO SUSP: 30.0000 mL | ORAL | Status: DC | PRN

## 2015-09-01 MED ORDER — METOCLOPRAMIDE HCL 5 MG/ML IJ SOLN: 5.0000 mg | Freq: Three times a day (TID) | INTRAMUSCULAR | Status: DC | PRN

## 2015-09-01 MED ORDER — FENTANYL CITRATE (PF) 100 MCG/2ML IJ SOLN
INTRAMUSCULAR | Status: AC
  Filled 2015-09-01: qty 2

## 2015-09-01 MED ORDER — CEFAZOLIN SODIUM-DEXTROSE 2-4 GM/100ML-% IV SOLN
2.0000 g | Freq: Four times a day (QID) | INTRAVENOUS | Status: AC
  Administered 2015-09-01 (×2): 2 g via INTRAVENOUS
  Filled 2015-09-01 (×2): qty 100

## 2015-09-01 MED ORDER — ONDANSETRON HCL 4 MG/2ML IJ SOLN: 4.0000 mg | Freq: Four times a day (QID) | INTRAMUSCULAR | Status: DC | PRN

## 2015-09-01 MED ORDER — DIPHENHYDRAMINE HCL 25 MG PO CAPS: 25.0000 mg | ORAL_CAPSULE | Freq: Four times a day (QID) | ORAL | Status: DC | PRN

## 2015-09-01 MED ORDER — FENTANYL CITRATE (PF) 100 MCG/2ML IJ SOLN
INTRAMUSCULAR | Status: DC | PRN
  Administered 2015-09-01: 25 ug via INTRAVENOUS
  Administered 2015-09-01: 50 ug via INTRAVENOUS
  Administered 2015-09-01 (×4): 12.5 ug via INTRAVENOUS
  Administered 2015-09-01: 25 ug via INTRAVENOUS

## 2015-09-01 MED ORDER — METOCLOPRAMIDE HCL 5 MG PO TABS: 5.0000 mg | ORAL_TABLET | Freq: Three times a day (TID) | ORAL | Status: DC | PRN

## 2015-09-01 MED ORDER — SODIUM CHLORIDE 0.9 % IV SOLN
100.0000 mL/h | INTRAVENOUS | Status: DC
  Administered 2015-09-01: 100 mL/h via INTRAVENOUS
  Filled 2015-09-01 (×4): qty 1000

## 2015-09-01 MED ORDER — PROPOFOL 500 MG/50ML IV EMUL
INTRAVENOUS | Status: DC | PRN
  Administered 2015-09-01: 50 ug/kg/min via INTRAVENOUS

## 2015-09-01 MED ORDER — MENTHOL 3 MG MT LOZG: 1.0000 | LOZENGE | OROMUCOSAL | Status: DC | PRN

## 2015-09-01 MED ORDER — FERROUS SULFATE 325 (65 FE) MG PO TABS
325.0000 mg | ORAL_TABLET | Freq: Three times a day (TID) | ORAL | Status: DC
  Administered 2015-09-02 (×2): 325 mg via ORAL
  Filled 2015-09-01 (×3): qty 1

## 2015-09-01 MED ORDER — SODIUM CHLORIDE 0.9 % IR SOLN
Status: DC | PRN
  Administered 2015-09-01: 1000 mL

## 2015-09-01 MED ORDER — ONDANSETRON HCL 4 MG/2ML IJ SOLN: 4.0000 mg | Freq: Once | INTRAMUSCULAR | Status: DC | PRN

## 2015-09-01 MED ORDER — DEXAMETHASONE SODIUM PHOSPHATE 10 MG/ML IJ SOLN
10.0000 mg | Freq: Once | INTRAMUSCULAR | Status: AC
  Administered 2015-09-02: 10 mg via INTRAVENOUS
  Filled 2015-09-01: qty 1

## 2015-09-01 MED ORDER — STERILE WATER FOR IRRIGATION IR SOLN
Status: DC | PRN
  Administered 2015-09-01: 2000 mL

## 2015-09-01 MED ORDER — ESCITALOPRAM OXALATE 10 MG PO TABS
5.0000 mg | ORAL_TABLET | Freq: Every day | ORAL | Status: DC
  Administered 2015-09-02: 5 mg via ORAL
  Filled 2015-09-01: qty 1

## 2015-09-01 MED ORDER — IRBESARTAN 150 MG PO TABS
150.0000 mg | ORAL_TABLET | Freq: Every day | ORAL | Status: DC
  Administered 2015-09-02: 150 mg via ORAL
  Filled 2015-09-01: qty 1

## 2015-09-01 MED ORDER — CHLORHEXIDINE GLUCONATE 4 % EX LIQD: 60.0000 mL | Freq: Once | CUTANEOUS | Status: DC

## 2015-09-01 MED ORDER — MAGNESIUM CITRATE PO SOLN: 1.0000 | Freq: Once | ORAL | Status: DC | PRN

## 2015-09-01 MED ORDER — METHOCARBAMOL 500 MG PO TABS
500.0000 mg | ORAL_TABLET | Freq: Four times a day (QID) | ORAL | Status: DC | PRN
  Administered 2015-09-02: 500 mg via ORAL
  Filled 2015-09-01: qty 1

## 2015-09-01 MED ORDER — ASPIRIN EC 325 MG PO TBEC
325.0000 mg | DELAYED_RELEASE_TABLET | Freq: Two times a day (BID) | ORAL | Status: DC
  Administered 2015-09-02: 325 mg via ORAL
  Filled 2015-09-01: qty 1

## 2015-09-01 MED ORDER — CEFAZOLIN SODIUM-DEXTROSE 2-4 GM/100ML-% IV SOLN
INTRAVENOUS | Status: AC
  Filled 2015-09-01: qty 100

## 2015-09-01 MED ORDER — GALANTAMINE HYDROBROMIDE ER 24 MG PO CP24
24.0000 mg | ORAL_CAPSULE | Freq: Every day | ORAL | Status: DC
  Administered 2015-09-02: 24 mg via ORAL
  Filled 2015-09-01 (×2): qty 1

## 2015-09-01 MED ORDER — PROPOFOL 10 MG/ML IV BOLUS
INTRAVENOUS | Status: DC | PRN
Start: 2015-09-01 — End: 2015-09-01
  Administered 2015-09-01: 20 mg via INTRAVENOUS
  Administered 2015-09-01 (×2): 30 mg via INTRAVENOUS
  Administered 2015-09-01 (×2): 20 mg via INTRAVENOUS

## 2015-09-01 MED ORDER — DEXAMETHASONE SODIUM PHOSPHATE 10 MG/ML IJ SOLN: 10.0000 mg | Freq: Once | INTRAMUSCULAR | Status: DC

## 2015-09-01 MED ORDER — DOCUSATE SODIUM 100 MG PO CAPS
100.0000 mg | ORAL_CAPSULE | Freq: Two times a day (BID) | ORAL | Status: DC
  Administered 2015-09-01 – 2015-09-02 (×2): 100 mg via ORAL
  Filled 2015-09-01 (×2): qty 1

## 2015-09-01 MED ORDER — POLYETHYLENE GLYCOL 3350 17 G PO PACK
17.0000 g | PACK | Freq: Two times a day (BID) | ORAL | Status: DC
Start: 2015-09-01 — End: 2015-09-02
  Administered 2015-09-02: 17 g via ORAL
  Filled 2015-09-01: qty 1

## 2015-09-01 MED ORDER — HYDROMORPHONE HCL 1 MG/ML IJ SOLN: 0.5000 mg | INTRAMUSCULAR | Status: DC | PRN

## 2015-09-01 MED ORDER — PHENOL 1.4 % MT LIQD
1.0000 | OROMUCOSAL | Status: DC | PRN
  Filled 2015-09-01: qty 177

## 2015-09-01 SURGICAL SUPPLY — 31 items
BAG ZIPLOCK 12X15 (MISCELLANEOUS) ×3 IMPLANT
CAPT HIP TOTAL 2 ×3 IMPLANT
CLOTH BEACON ORANGE TIMEOUT ST (SAFETY) ×3 IMPLANT
COVER PERINEAL POST (MISCELLANEOUS) ×3 IMPLANT
DRAPE STERI IOBAN 125X83 (DRAPES) ×3 IMPLANT
DRAPE U-SHAPE 47X51 STRL (DRAPES) ×6 IMPLANT
DRESSING AQUACEL AG SP 3.5X10 (GAUZE/BANDAGES/DRESSINGS) ×1 IMPLANT
DRSG AQUACEL AG SP 3.5X10 (GAUZE/BANDAGES/DRESSINGS) ×3
DURAPREP 26ML APPLICATOR (WOUND CARE) ×3 IMPLANT
ELECT REM PT RETURN 9FT ADLT (ELECTROSURGICAL) ×3
ELECTRODE REM PT RTRN 9FT ADLT (ELECTROSURGICAL) ×1 IMPLANT
GLOVE BIOGEL M STRL SZ7.5 (GLOVE) ×6 IMPLANT
GLOVE BIOGEL PI IND STRL 7.5 (GLOVE) ×6 IMPLANT
GLOVE BIOGEL PI INDICATOR 7.5 (GLOVE) ×12
GLOVE ORTHO TXT STRL SZ7.5 (GLOVE) ×6 IMPLANT
GLOVE SURG SS PI 7.5 STRL IVOR (GLOVE) ×6 IMPLANT
GOWN STRL REUS W/ TWL LRG LVL3 (GOWN DISPOSABLE) ×1 IMPLANT
GOWN STRL REUS W/TWL LRG LVL3 (GOWN DISPOSABLE) ×5 IMPLANT
GOWN STRL REUS W/TWL XL LVL3 (GOWN DISPOSABLE) ×6 IMPLANT
HOLDER FOLEY CATH W/STRAP (MISCELLANEOUS) ×3 IMPLANT
LIQUID BAND (GAUZE/BANDAGES/DRESSINGS) ×3 IMPLANT
PACK ANTERIOR HIP CUSTOM (KITS) ×3 IMPLANT
SAW OSC TIP CART 19.5X105X1.3 (SAW) ×3 IMPLANT
SLEEVE SURGEON STRL (DRAPES) ×3 IMPLANT
SUT MNCRL AB 4-0 PS2 18 (SUTURE) ×3 IMPLANT
SUT VIC AB 1 CT1 36 (SUTURE) ×9 IMPLANT
SUT VIC AB 2-0 CT1 27 (SUTURE) ×4
SUT VIC AB 2-0 CT1 TAPERPNT 27 (SUTURE) ×2 IMPLANT
SUT VLOC 180 0 24IN GS25 (SUTURE) ×3 IMPLANT
TRAY FOLEY W/METER SILVER 14FR (SET/KITS/TRAYS/PACK) ×3 IMPLANT
YANKAUER SUCT BULB TIP 10FT TU (MISCELLANEOUS) ×3 IMPLANT

## 2015-09-01 NOTE — Interval H&P Note (Signed)
**Note Margaret-Identified via Obfuscation** History and Physical Interval Note:  09/01/2015 9:44 AM  Margaret [name]  has presented today for surgery, with the diagnosis of left hip osteoarthritis  The various methods of treatment have been discussed with the patient and family. After consideration of risks, benefits and other options for treatment, the patient has consented to  Procedure(s): LEFT TOTAL HIP ARTHROPLASTY ANTERIOR APPROACH (Left) as a surgical intervention .  The patient's history has been reviewed, patient examined, no change in status, stable for surgery.  I have reviewed the patient's chart and labs.  Questions were answered to the patient's satisfaction.     Mauri Pole

## 2015-09-01 NOTE — Anesthesia Procedure Notes (Signed)
Spinal Patient location during procedure: OR End time: 09/01/2015 11:02 AM Staffing Resident/CRNA: Enrigue Catena E Performed by: anesthesiologist  Preanesthetic Checklist Completed: patient identified, site marked, surgical consent, pre-op evaluation, timeout performed, IV checked, risks and benefits discussed and monitors and equipment checked Spinal Block Patient position: sitting Prep: Betadine Patient monitoring: heart rate, continuous pulse ox and blood pressure Location: L3-4 Injection technique: single-shot Needle Needle type: Spinocan  Needle gauge: 22 G Needle length: 9 cm Assessment Sensory level: T4 Additional Notes Expiration date of kit checked and confirmed. Patient tolerated procedure well, without complications.

## 2015-09-01 NOTE — Discharge Instructions (Signed)

## 2015-09-01 NOTE — Progress Notes (Signed)
Portable AP Pelvis and Lateral Left Hip X-rays done. 

## 2015-09-01 NOTE — Anesthesia Preprocedure Evaluation (Signed)
Anesthesia Evaluation  Patient identified by MRN, date of birth, ID band Patient awake    Reviewed: Allergy & Precautions, NPO status , Patient's Chart, lab work & pertinent test results  Airway Mallampati: I  TM Distance: >3 FB Neck ROM: Full    Dental   Pulmonary former smoker,    Pulmonary exam normal        Cardiovascular hypertension, Normal cardiovascular exam     Neuro/Psych    GI/Hepatic GERD  ,  Endo/Other    Renal/GU      Musculoskeletal  (+) Arthritis ,   Abdominal   Peds  Hematology   Anesthesia Other Findings Dementia  Reproductive/Obstetrics                             Anesthesia Physical Anesthesia Plan  ASA: II  Anesthesia Plan: Spinal   Post-op Pain Management:    Induction: Intravenous  Airway Management Planned: Mask  Additional Equipment:   Intra-op Plan:   Post-operative Plan:   Informed Consent: I have reviewed the patients History and Physical, chart, labs and discussed the procedure including the risks, benefits and alternatives for the proposed anesthesia with the patient or authorized representative who has indicated his/her understanding and acceptance.     Plan Discussed with: CRNA, Anesthesiologist and Surgeon  Anesthesia Plan Comments:         Anesthesia Quick Evaluation

## 2015-09-01 NOTE — Progress Notes (Signed)
X-ray results noted 

## 2015-09-01 NOTE — Transfer of Care (Signed)
**Note Margaret-Identified via Obfuscation** Immediate Anesthesia Transfer of Care Note  Patient: Margaret [name]  Procedure(s) Performed: Procedure(s): LEFT TOTAL HIP ARTHROPLASTY ANTERIOR APPROACH (Left)  Patient Location: PACU  Anesthesia Type:General and Spinal  Level of Consciousness: Patient easily awoken, sedated, comfortable, cooperative, following commands, responds to stimulation.   Airway & Oxygen Therapy: Patient spontaneously breathing, ventilating well, oxygen via simple oxygen mask.  Post-op Assessment: Report given to PACU RN, vital signs reviewed and stable.   Post vital signs: Reviewed and stable.  Complications: No apparent anesthesia complications

## 2015-09-01 NOTE — Op Note (Signed)
NAME:  Margaret [name] NO.: [id number]      MEDICAL RECORD NO.: [id number]      FACILITY:  [facility]      PHYSICIAN:  Paralee Cancel D  DATE OF BIRTH:  04-Oct-1934     DATE OF PROCEDURE:  09/01/2015                                 OPERATIVE REPORT         PREOPERATIVE DIAGNOSIS: Left  hip osteoarthritis.      POSTOPERATIVE DIAGNOSIS:  Left hip osteoarthritis.      PROCEDURE:  Left total hip replacement through an anterior approach   utilizing DePuy THR system, component size 68mm pinnacle cup, a size 32+4 neutral   Altrex liner, a size 3Hi Tri Lock stem with a 32+1 delta ceramic   ball.      SURGEON:  Pietro Cassis. Alvan Dame, M.D.      ASSISTANT:  Nehemiah Massed, PA-C     ANESTHESIA:  Spinal.      SPECIMENS:  None.      COMPLICATIONS:  None.      BLOOD LOSS:  200 cc     DRAINS:  None.      INDICATION OF THE PROCEDURE:  Margaret [name] is a 81 y.o. female who had   presented to office for evaluation of left hip pain.  Radiographs revealed   progressive degenerative changes with bone-on-bone   articulation to the  hip joint.  The patient had painful limited range of   motion significantly affecting their overall quality of life.  The patient was failing to    respond to conservative measures, and at this point was ready   to proceed with more definitive measures.  The patient has noted progressive   degenerative changes in his hip, progressive problems and dysfunction   with regarding the hip prior to surgery.  Consent was obtained for   benefit of pain relief.  Specific risk of infection, DVT, component   failure, dislocation, need for revision surgery, as well discussion of   the anterior versus posterior approach were reviewed.  Consent was   obtained for benefit of anterior pain relief through an anterior   approach.      PROCEDURE IN DETAIL:  The patient was brought to operative theater.   Once adequate anesthesia,  preoperative antibiotics, 2gm of Ancef, 1 gm of Tranexamic Acid, and 10 mg of Decadron administered.   The patient was positioned supine on the OSI [place] table.  Once adequate   padding of boney process was carried out, we had predraped out the hip, and  used fluoroscopy to confirm orientation of the pelvis and position.      The left hip was then prepped and draped from proximal iliac crest to   mid thigh with shower curtain technique.      Time-out was performed identifying the patient, planned procedure, and   extremity.     An incision was then made 2 cm distal and lateral to the   anterior superior iliac spine extending over the orientation of the   tensor fascia lata muscle and sharp dissection was carried down to the   fascia of the muscle and protractor placed in the soft tissues.      The fascia was then  incised.  The muscle belly was identified and swept   laterally and retractor placed along the superior neck.  Following   cauterization of the circumflex vessels and removing some pericapsular   fat, a second cobra retractor was placed on the inferior neck.  A third   retractor was placed on the anterior acetabulum after elevating the   anterior rectus.  A L-capsulotomy was along the line of the   superior neck to the trochanteric fossa, then extended proximally and   distally.  Tag sutures were placed and the retractors were then placed   intracapsular.  We then identified the trochanteric fossa and   orientation of my neck cut, confirmed this radiographically   and then made a neck osteotomy with the femur on traction.  The femoral   head was removed without difficulty or complication.  Traction was let   off and retractors were placed posterior and anterior around the   acetabulum.      The labrum and foveal tissue were debrided.  I began reaming with a 70mm   reamer and reamed up to 45mm reamer with good bony bed preparation and a 39mm   cup was chosen.  The final 63mm  Pinnacle cup was then impacted under fluoroscopy  to confirm the depth of penetration and orientation with respect to   abduction.  A screw was placed followed by the hole eliminator.  The final   32+4 neutral Altrex liner was impacted with good visualized rim fit.  The cup was positioned anatomically within the acetabular portion of the pelvis.      At this point, the femur was rolled at 80 degrees.  Further capsule was   released off the inferior aspect of the femoral neck.  I then   released the superior capsule proximally.  The hook was placed laterally   along the femur and elevated manually and held in position with the bed   hook.  The leg was then extended and adducted with the leg rolled to 100   degrees of external rotation.  Once the proximal femur was fully   exposed, I used a box osteotome to set orientation.  I then began   broaching with the starting chili pepper broach and passed this by hand and then broached up to 3.  With the 3 broach in place I chose a high offset neck and did trial reductions.  The offset was appropriate, leg lengths   appeared to be equal best matched with a +1 head ball, confirmed radiographically.   Given these findings, I went ahead and dislocated the hip, repositioned all   retractors and positioned the right hip in the extended and abducted position.  The final 3 Hi Tri Lock stem was   chosen and it was impacted down to the level of neck cut.  Based on this   and the trial reduction, a 32+1 delta ceramic ball was chosen and   impacted onto a clean and dry trunnion, and the hip was reduced.  The   hip had been irrigated throughout the case again at this point.  I did   reapproximate the superior capsular leaflet to the anterior leaflet   using #1 Vicryl.  The fascia of the   tensor fascia lata muscle was then reapproximated using #1 Vicryl and #0 V-lock sutures.  The   remaining wound was closed with 2-0 Vicryl and running 4-0 Monocryl.   The hip was  cleaned, dried, and dressed sterilely  using Dermabond and   Aquacel dressing.  She was then brought   to recovery room in stable condition tolerating the procedure well.    Nehemiah Massed, PA-C was present for the entirety of the case involved from   preoperative positioning, perioperative retractor management, general   facilitation of the case, as well as primary wound closure as assistant.            Pietro Cassis Alvan Dame, M.D.        09/01/2015 12:23 PM

## 2015-09-01 NOTE — Anesthesia Postprocedure Evaluation (Signed)
Anesthesia Post Note  Patient: Margaret [name]  Procedure(s) Performed: Procedure(s) (LRB): LEFT TOTAL HIP ARTHROPLASTY ANTERIOR APPROACH (Left)  Patient location during evaluation: PACU Anesthesia Type: Spinal Level of consciousness: awake, awake and alert, oriented and patient cooperative Pain management: pain level controlled Vital Signs Assessment: post-procedure vital signs reviewed and stable Respiratory status: spontaneous breathing and respiratory function stable Cardiovascular status: stable Postop Assessment: spinal receding Anesthetic complications: no    Last Vitals:  Filed Vitals:   09/01/15 1300 09/01/15 1315  BP: 118/57 118/63  Pulse: 76 62  Temp:    Resp: 15 12    Last Pain:  Filed Vitals:   09/01/15 1322  PainSc: 0-No pain                 [name]

## 2015-09-02 LAB — BASIC METABOLIC PANEL
ANION GAP: 8 (ref 5–15)
BUN: 16 mg/dL (ref 6–20)
CHLORIDE: 110 mmol/L (ref 101–111)
CO2: 22 mmol/L (ref 22–32)
Calcium: 8 mg/dL — ABNORMAL LOW (ref 8.9–10.3)
Creatinine, Ser: 0.54 mg/dL (ref 0.44–1.00)
GFR calc Af Amer: >60 mL/min (ref >60)
GFR calc non Af Amer: >60 mL/min (ref >60)
GLUCOSE: 150 mg/dL — AB (ref 65–99)
POTASSIUM: 3.9 mmol/L (ref 3.5–5.1)
Sodium: 140 mmol/L (ref 135–145)

## 2015-09-02 LAB — CBC
HEMATOCRIT: 30.6 % — AB (ref 36.0–46.0)
HEMOGLOBIN: 10.3 g/dL — AB (ref 12.0–15.0)
MCH: 33 pg (ref 26.0–34.0)
MCHC: 33.7 g/dL (ref 30.0–36.0)
MCV: 98.1 fL (ref 78.0–100.0)
PLATELETS: 288 10*3/uL (ref 150–400)
RBC: 3.12 MIL/uL — AB (ref 3.87–5.11)
RDW: 13.4 % (ref 11.5–15.5)
WBC: 13.1 10*3/uL — AB (ref 4.0–10.5)

## 2015-09-02 MED ORDER — TIZANIDINE HCL 4 MG PO TABS: 4.0000 mg | ORAL_TABLET | Freq: Four times a day (QID) | ORAL | Status: DC | PRN

## 2015-09-02 MED ORDER — HYDROCODONE-ACETAMINOPHEN 7.5-325 MG PO TABS: 1.0000 | ORAL_TABLET | ORAL | Status: DC | PRN

## 2015-09-02 MED ORDER — ASPIRIN 325 MG PO TBEC: 325.0000 mg | DELAYED_RELEASE_TABLET | Freq: Two times a day (BID) | ORAL | Status: AC

## 2015-09-02 MED ORDER — FERROUS SULFATE 325 (65 FE) MG PO TABS: 325.0000 mg | ORAL_TABLET | Freq: Three times a day (TID) | ORAL | Status: DC

## 2015-09-02 MED ORDER — POLYETHYLENE GLYCOL 3350 17 G PO PACK: 17.0000 g | PACK | Freq: Two times a day (BID) | ORAL | Status: DC

## 2015-09-02 MED ORDER — DOCUSATE SODIUM 100 MG PO CAPS
100.0000 mg | ORAL_CAPSULE | Freq: Two times a day (BID) | ORAL | Status: DC
Start: 2015-09-02 — End: 2018-05-29

## 2015-09-02 NOTE — Progress Notes (Signed)
[facility]    [facility] is providing the following services: RW and commode  If patient discharges after hours, please call [phone].   Linward Headland 09/02/2015, 4:17 PM

## 2015-09-02 NOTE — Progress Notes (Signed)
Physical Therapy Treatment Note    09/02/15 1500  PT Visit Information  Last PT Received On 09/02/15  Assistance Needed +1  History of Present Illness Pt is a 81 year old female s/p Left direct anterior THA with hx of memory loss.  Subjective Data  Subjective Pt ambulated in hallway and performed LE exercises.  Pt with memory loss and repeatedly asking the same questions.  Family present in room and report she will have assist for mobility upon d/c.  Pt may d/c home today.  Precautions  Precautions Fall  Restrictions  Other Position/Activity Restrictions WBAT  Pain Assessment  Pain Assessment 0-10  Pain Score 6  Pain Location L hip  Pain Descriptors / Indicators Aching  Pain Intervention(s) Premedicated before session;Limited activity within patient's tolerance;Repositioned  Cognition  Arousal/Alertness Awake/alert  Behavior During Therapy WFL for tasks assessed/performed  Overall Cognitive Status Within Functional Limits for tasks assessed  Memory Decreased short-term memory  Bed Mobility  Overal bed mobility Needs Assistance  Bed Mobility Supine to Sit;Sit to Supine  Supine to sit Min assist  Sit to supine Min assist  General bed mobility comments assist for L LE  Transfers  Overall transfer level Needs assistance  Equipment used Rolling walker (2 wheeled)  Transfers Sit to/from Stand  Sit to Stand Min guard  General transfer comment verbal cues for UE and LE positioning  Ambulation/Gait  Ambulation/Gait assistance Min guard  Ambulation Distance (Feet) 60 Feet  Assistive device Rolling walker (2 wheeled)  Gait Pattern/deviations Step-to pattern;Antalgic;Decreased stance time - left  General Gait Details verbal cues for sequence, RW positioning, step length  Exercises  Exercises Total Joint  Total Joint Exercises  Ankle Circles/Pumps AROM;Both;10 reps  Quad Sets AROM;Both;10 reps  Short Arc Quad AROM;Left;10 reps  Heel Slides AAROM;Left;10 reps  Hip  ABduction/ADduction Left;10 reps;AAROM  PT - End of Session  Activity Tolerance Patient tolerated treatment well  Patient left in bed;with call bell/phone within reach;with family/visitor present  PT - Assessment/Plan  PT Plan Current plan remains appropriate  PT Frequency (ACUTE ONLY) 7X/week  Follow Up Recommendations Home health PT  PT equipment Rolling walker with 5" wheels  PT Time Calculation  PT Start Time (ACUTE ONLY) 1414  PT Stop Time (ACUTE ONLY) 1432  PT Time Calculation (min) (ACUTE ONLY) 18 min  PT General Charges  $$ ACUTE PT VISIT 1 Procedure  PT Treatments  $Gait Training 8-22 mins   Carmelia Bake, PT, DPT 09/02/2015 Pager: [phone]

## 2015-09-02 NOTE — Progress Notes (Signed)
     Subjective: 1 Day Post-Op Procedure(s) (LRB): LEFT TOTAL HIP ARTHROPLASTY ANTERIOR APPROACH (Left)   Patient reports pain as mild, pain controlled.  Sitting comfortably in bed, joking.  No events throughout the night.  Ready to be discharged home if she does well with PT.  Objective:   VITALS:   Filed Vitals:   09/02/15 0211 09/02/15 0628  BP: 108/58 101/65  Pulse: 66 68  Temp: 98.6 F (37 C) 98.4 F (36.9 C)  Resp: 18 16    Dorsiflexion/Plantar flexion intact Incision: dressing C/D/I No cellulitis present Compartment soft  LABS  Recent Labs  09/02/15 0413  HGB 10.3*  HCT 30.6*  WBC 13.1*  PLT 288     Recent Labs  09/02/15 0413  NA 140  K 3.9  BUN 16  CREATININE 0.54  GLUCOSE 150*     Assessment/Plan: 1 Day Post-Op Procedure(s) (LRB): LEFT TOTAL HIP ARTHROPLASTY ANTERIOR APPROACH (Left) Foley cath d/c'ed Advance diet Up with therapy D/C IV fluids Discharge home with home health  Follow up in 2 weeks at [facility] Follow up with OLIN,Dalya Maselli D in 2 weeks.  Contact information:  [facility] [address], [address] [place]   PAC  09/02/2015, 8:51 AM

## 2015-09-02 NOTE — Evaluation (Signed)
Occupational Therapy Evaluation Patient Details Name: Margaret [name] MRN: [id number] DOB: 05-12-1934 Today's Date: 09/02/2015    History of Present Illness Pt is a 81 year old female s/p Left direct anterior THA with hx of memory loss.   Clinical Impression   OT education complete regarding ADL activity s/p THA. Pt will have a caregiver upon DC    Follow Up Recommendations  No OT follow up    Equipment Recommendations  3 in 1 bedside comode    Recommendations for Other Services       Precautions / Restrictions Precautions Precautions: Fall Restrictions Weight Bearing Restrictions: No Other Position/Activity Restrictions: WBAT      Mobility Bed Mobility Overal bed mobility: Needs Assistance Bed Mobility: Sit to Supine     Supine to sit: Min assist Sit to supine: Min assist   General bed mobility comments: assist for L LE  Transfers Overall transfer level: Needs assistance Equipment used: Rolling walker (2 wheeled) Transfers: Sit to/from [facility] Sit to Stand: Min assist         General transfer comment: verbal cues for UE and LE positioning, assist to rise and steady         ADL Overall ADL's : Needs assistance/impaired     Grooming: Set up;Sitting   Upper Body Bathing: Set up;Sitting   Lower Body Bathing: Moderate assistance;Sit to/from stand;Cueing for sequencing;Cueing for safety   Upper Body Dressing : Set up;Sitting   Lower Body Dressing: Moderate assistance;Sit to/from stand;Cueing for sequencing;Cueing for safety   Toilet Transfer: Minimal assistance;RW;BSC   Toileting- Clothing Manipulation and Hygiene: Minimal assistance;Sit to/from stand;Cueing for sequencing;Cueing for safety     Tub/Shower Transfer Details (indicate cue type and reason): verbalized technique                   Pertinent Vitals/Pain Pain Assessment: 0-10 Pain Score: 5  Pain Location: L hip Pain Descriptors / Indicators: Sore Pain  Intervention(s): Repositioned        Extremity/Trunk Assessment Upper Extremity Assessment Upper Extremity Assessment: Overall WFL for tasks assessed   Lower Extremity Assessment Lower Extremity Assessment: LLE deficits/detail LLE Deficits / Details: L hip functional weakness observed, assist for bed mobility       Communication Communication Communication: No difficulties   Cognition Arousal/Alertness: Awake/alert Behavior During Therapy: WFL for tasks assessed/performed (slightly impulsive, multiple cues to wait to mobilize) Overall Cognitive Status: Within Functional Limits for tasks assessed    Pt did need extra VC and VC to stay on task.                              Home Living Family/patient expects to be discharged to:: Private residence Living Arrangements: Alone;Other (Comment) Available Help at Discharge: Personal care attendant;Available 24 hours/day Type of Home: House Home Access: Level entry     Home Layout: Able to live on main level with bedroom/bathroom     Bathroom Shower/Tub: Tub/shower unit         Home Equipment: None          Prior Functioning/Environment Level of Independence: Independent                      OT Goals(Current goals can be found in the care plan section) Acute Rehab OT Goals Patient Stated Goal: home today  OT Frequency:  End of Session Equipment Utilized During Treatment: Surveyor, mining Communication: Mobility status  Activity Tolerance: Patient tolerated treatment well Patient left: in bed;with family/visitor present   Time: 1102-1130 OT Time Calculation (min): 28 min Charges:  OT General Charges $OT Visit: 1 Procedure OT Evaluation $OT Eval Low Complexity: 1 Procedure OT Treatments $Self Care/Home Management : 8-22 mins G-Codes:    Payton Mccallum D Sep 16, 2015, 11:34 AM

## 2015-09-02 NOTE — Evaluation (Signed)
Physical Therapy Evaluation Patient Details Name: Margaret [name] MRN: [id number] DOB: 17-Aug-1934 Today's Date: 09/02/2015   History of Present Illness  Pt is a 81 year old female s/p Left direct anterior THA with hx of memory loss.  Clinical Impression  Pt is s/p L THA resulting in the deficits listed below (see PT Problem List).  Pt will benefit from skilled PT to increase their independence and safety with mobility to allow discharge to the venue listed below.  Pt reporting 9/10 pain with ambulation so only ambulated around bed to recliner.     Follow Up Recommendations Home health PT    Equipment Recommendations  Rolling walker with 5" wheels    Recommendations for Other Services       Precautions / Restrictions Precautions Precautions: Fall Restrictions Other Position/Activity Restrictions: WBAT      Mobility  Bed Mobility Overal bed mobility: Needs Assistance Bed Mobility: Supine to Sit     Supine to sit: Min assist     General bed mobility comments: assist for L LE  Transfers Overall transfer level: Needs assistance Equipment used: Rolling walker (2 wheeled) Transfers: Sit to/from Stand Sit to Stand: Min assist         General transfer comment: verbal cues for UE and LE positioning, assist to rise and steady  Ambulation/Gait Ambulation/Gait assistance: Min assist Ambulation Distance (Feet): 10 Feet Assistive device: Rolling walker (2 wheeled) Gait Pattern/deviations: Step-to pattern;Antalgic     General Gait Details: verbal cues for sequence, RW positioning, step length, distance limited by pain  Stairs            Wheelchair Mobility    Modified Rankin (Stroke Patients Only)       Balance                                             Pertinent Vitals/Pain Pain Assessment: 0-10 Pain Score: 9  Pain Location: L hip and thigh Pain Descriptors / Indicators: Sore Pain Intervention(s): Limited activity within  patient's tolerance;Monitored during session;Patient requesting pain meds-RN notified;Repositioned;Ice applied    Home Living Family/patient expects to be discharged to:: Private residence Living Arrangements: Alone;Other (Comment) Available Help at Discharge: Personal care attendant;Available 24 hours/day Type of Home: House Home Access: Level entry     Home Layout: Able to live on main level with bedroom/bathroom Home Equipment: None      Prior Function Level of Independence: Independent               Hand Dominance        Extremity/Trunk Assessment               Lower Extremity Assessment: LLE deficits/detail   LLE Deficits / Details: L hip functional weakness observed, assist for bed mobility     Communication   Communication: No difficulties  Cognition Arousal/Alertness: Awake/alert Behavior During Therapy: WFL for tasks assessed/performed (slightly impulsive, multiple cues to wait to mobilize) Overall Cognitive Status: Within Functional Limits for tasks assessed                      General Comments      Exercises        Assessment/Plan    PT Assessment Patient needs continued PT services  PT Diagnosis Difficulty walking;Acute pain   PT Problem List Decreased strength;Decreased mobility;Pain;Decreased knowledge of precautions;Decreased safety awareness;Decreased  knowledge of use of DME  PT Treatment Interventions Functional mobility training;Stair training;Gait training;DME instruction;Patient/family education;Therapeutic exercise;Therapeutic activities   PT Goals (Current goals can be found in the Care Plan section) Acute Rehab PT Goals PT Goal Formulation: With patient Time For Goal Achievement: 09/05/15 Potential to Achieve Goals: Good    Frequency 7X/week   Barriers to discharge        Co-evaluation               End of Session Equipment Utilized During Treatment: Gait belt Activity Tolerance: Patient limited by  pain Patient left: in chair;with call bell/phone within reach;with family/visitor present Nurse Communication: Mobility status         Time: [id number] PT Time Calculation (min) (ACUTE ONLY): 18 min   Charges:   PT Evaluation $PT Eval Low Complexity: 1 Procedure     PT G Codes:        Nithin Demeo,KATHrine E 09/02/2015, 10:21 AM Carmelia Bake, PT, DPT 09/02/2015 Pager: [phone]

## 2015-09-02 NOTE — Care Management Note (Signed)
Case Management Note  Patient Details  Name: Margaret [name] MRN: [id number] Date of Birth: [birth year]  Subjective/Objective: 81 y/o f admitted w/L THA. From home. [employer] rep TIm following. dme 3n1, rw ordered-[facility] dme rep to deliver to rm.                   Action/Plan:d/c home w/HHC   Expected Discharge Date:                  Expected Discharge Plan:  [place]  In-House Referral:     Discharge planning Services  CM Consult  Post Acute Care Choice:    Choice offered to:     DME Arranged:  3-N-1, Walker rolling DME Agency:  Register:  PT [place]:  [place]  Status of Service:  In process, will continue to follow  Medicare Important Message Given:    Date Medicare IM Given:    Medicare IM give by:    Date Additional Medicare IM Given:    Additional Medicare Important Message give by:     If discussed at [place] of Stay Meetings, dates discussed:    Additional Comments:  Dessa Phi, RN 09/02/2015, 2:31 PM

## 2015-09-08 DIAGNOSIS — Z471 Aftercare following joint replacement surgery: Secondary | ICD-10-CM | POA: Diagnosis not present

## 2015-09-08 DIAGNOSIS — M8580 Other specified disorders of bone density and structure, unspecified site: Secondary | ICD-10-CM | POA: Diagnosis not present

## 2015-09-08 DIAGNOSIS — Z87891 Personal history of nicotine dependence: Secondary | ICD-10-CM | POA: Diagnosis not present

## 2015-09-08 DIAGNOSIS — Z96642 Presence of left artificial hip joint: Secondary | ICD-10-CM | POA: Diagnosis not present

## 2015-09-08 DIAGNOSIS — F0391 Unspecified dementia with behavioral disturbance: Secondary | ICD-10-CM | POA: Diagnosis not present

## 2015-09-08 DIAGNOSIS — I10 Essential (primary) hypertension: Secondary | ICD-10-CM | POA: Diagnosis not present

## 2015-09-08 NOTE — Discharge Summary (Signed)
Physician Discharge Summary  Patient ID: Margaret [name] MRN: [id number] DOB/AGE: 1934-12-23 81 y.o.  Admit date: 09/01/2015 Discharge date: 09/02/2015   Procedures:  Procedure(s) (LRB): LEFT TOTAL HIP ARTHROPLASTY ANTERIOR APPROACH (Left)  Attending Physician:  Dr. Paralee Cancel   Admission Diagnoses:   Left hip primary OA / pain  Discharge Diagnoses:  Principal Problem:   S/P left THA, AA  Past Medical History  Diagnosis Date  . Essential hypertension     a.  02/2007 Echo: Nl EF, triv AI.  Marland Kitchen Mixed hyperlipidemia   . Osteopenia   . Lichen sclerosus   . Disturbances of sensation of smell and taste   . Lipoma of face   . Other persistent mental disorders due to conditions classified elsewhere   . Memory loss   . GERD (gastroesophageal reflux disease)   . Arthritis     HPI:    Margaret [name], 81 y.o. female, has a history of pain and functional disability in the left hip(s) due to arthritis and patient has failed non-surgical conservative treatments for greater than 12 weeks to include NSAID's and/or analgesics and activity modification. Onset of symptoms was gradual starting years ago with gradually worsening course since that time.The patient noted no past surgery on the left hip(s). Patient currently rates pain in the left hip at 9 out of 10 with activity. Patient has night pain, worsening of pain with activity and weight bearing, trendelenberg gait, pain that interfers with activities of daily living and pain with passive range of motion. Patient has evidence of periarticular osteophytes and joint space narrowing by imaging studies. This condition presents safety issues increasing the risk of falls. There is no current active infection. Risks, benefits and expectations were discussed with the patient. Risks including but not limited to the risk of anesthesia, blood clots, nerve damage, blood vessel damage, failure of the prosthesis, infection and up to and including  death. Patient understand the risks, benefits and expectations and wishes to proceed with surgery.   PCP: Marton Redwood, MD   Discharged Condition: good  Hospital Course:  Patient underwent the above stated procedure on 09/01/2015. Patient tolerated the procedure well and brought to the recovery room in good condition and subsequently to the floor.  POD #1 BP: 101/65 ; Pulse: 68 ; Temp: 98.4 F (36.9 C) ; Resp: 16 Patient reports pain as mild, pain controlled. Sitting comfortably in bed, joking. No events throughout the night. Ready to be discharged home. Dorsiflexion/plantar flexion intact, incision: dressing C/D/I, no cellulitis present and compartment soft.   LABS  Basename    HGB     10.3  HCT     30.6    Discharge Exam: General appearance: alert, cooperative and no distress Extremities: Homans sign is negative, no sign of DVT, no edema, redness or tenderness in the calves or thighs and no ulcers, gangrene or trophic changes  Disposition: Home with follow up in 2 weeks   Follow-up Information    Follow up with Mauri Pole, MD. Schedule an appointment as soon as possible for a visit in 2 weeks.   Specialty:  Orthopedic Surgery   Contact information:   [address] [place] [address] [phone]       Follow up with [facility].   Why:  HHPT   Contact information:   [place] [address] [phone]       Discharge Instructions    Call MD / Call 911  Complete by:  As directed   If you experience chest pain or shortness of breath, CALL 911 and be transported to the hospital emergency room.  If you develope a fever above 101 F, pus (white drainage) or increased drainage or redness at the wound, or calf pain, call your surgeon's office.     Change dressing    Complete by:  As directed   Maintain surgical dressing until follow up in the clinic. If the edges start to pull up, may reinforce with tape. If the  dressing is no longer working, may remove and cover with gauze and tape, but must keep the area dry and clean.  Call with any questions or concerns.     Constipation Prevention    Complete by:  As directed   Drink plenty of fluids.  Prune juice may be helpful.  You may use a stool softener, such as Colace (over the counter) 100 mg twice a day.  Use MiraLax (over the counter) for constipation as needed.     Diet - low sodium heart healthy    Complete by:  As directed      Discharge instructions    Complete by:  As directed   Maintain surgical dressing until follow up in the clinic. If the edges start to pull up, may reinforce with tape. If the dressing is no longer working, may remove and cover with gauze and tape, but must keep the area dry and clean.  Follow up in 2 weeks at [facility]. Call with any questions or concerns.     Increase activity slowly as tolerated    Complete by:  As directed   Weight bearing as tolerated with assist device (walker, cane, etc) as directed, use it as long as suggested by your surgeon or therapist, typically at least 4-6 weeks.     TED hose    Complete by:  As directed   Use stockings (TED hose) for 2 weeks on both leg(s).  You may remove them at night for sleeping.             Medication List    STOP taking these medications        acetaminophen 650 MG CR tablet  Commonly known as:  TYLENOL     aspirin 81 MG tablet  Replaced by:  aspirin 325 MG EC tablet     diclofenac sodium 1 % Gel  Commonly known as:  VOLTAREN      TAKE these medications        aspirin 325 MG EC tablet  Take 1 tablet (325 mg total) by mouth 2 (two) times daily.     CALCIUM 1200 PO  Take 1 tablet by mouth daily.     cetirizine 10 MG tablet  Commonly known as:  ZYRTEC  Take 1 tablet (10 mg total) by mouth daily.     docusate sodium 100 MG capsule  Commonly known as:  COLACE  Take 1 capsule (100 mg total) by mouth 2 (two) times daily.     escitalopram 5  MG tablet  Commonly known as:  LEXAPRO  TAKE 1 TABLET BY MOUTH DAILY*CALL OFFICE TO MAKE APPOINTMENT*     ferrous sulfate 325 (65 FE) MG tablet  Take 1 tablet (325 mg total) by mouth 3 (three) times daily after meals.     galantamine 24 MG 24 hr capsule  Commonly known as:  RAZADYNE ER  TAKE 1 CAPSULE BY MOUTH EVERY DAY  HYDROcodone-acetaminophen 7.5-325 MG tablet  Commonly known as:  NORCO  Take 1-2 tablets by mouth every 4 (four) hours as needed for moderate pain.     multivitamin with minerals Tabs tablet  Take 1 tablet by mouth daily.     NAMENDA XR 28 MG Cp24 24 hr capsule  Generic drug:  memantine  TAKE 1 CAPSULE(28 MG) BY MOUTH DAILY     polyethylene glycol packet  Commonly known as:  MIRALAX / GLYCOLAX  Take 17 g by mouth 2 (two) times daily.     ranitidine 75 MG tablet  Commonly known as:  PX RANITIDINE  Take 1 tablet (75 mg total) by mouth at bedtime.     rosuvastatin 40 MG tablet  Commonly known as:  CRESTOR  Take 1 tablet (40 mg total) by mouth daily.     tiZANidine 4 MG tablet  Commonly known as:  ZANAFLEX  Take 1 tablet (4 mg total) by mouth every 6 (six) hours as needed for muscle spasms.     traMADol 50 MG tablet  Commonly known as:  ULTRAM  Take one tablet by mouth three times daily as needed for moderate to severe pain     valsartan 160 MG tablet  Commonly known as:  DIOVAN  TAKE 1 TABLET(160 MG) BY MOUTH DAILY     Vitamin D3 2000 units Tabs  Take 2,000 Units by mouth daily.         Signed: [place]. Joel Cowin   PA-C  09/08/2015, 9:56 AM

## 2015-09-09 DIAGNOSIS — F0391 Unspecified dementia with behavioral disturbance: Secondary | ICD-10-CM | POA: Diagnosis not present

## 2015-09-09 DIAGNOSIS — Z87891 Personal history of nicotine dependence: Secondary | ICD-10-CM | POA: Diagnosis not present

## 2015-09-09 DIAGNOSIS — Z96642 Presence of left artificial hip joint: Secondary | ICD-10-CM | POA: Diagnosis not present

## 2015-09-09 DIAGNOSIS — I10 Essential (primary) hypertension: Secondary | ICD-10-CM | POA: Diagnosis not present

## 2015-09-09 DIAGNOSIS — Z471 Aftercare following joint replacement surgery: Secondary | ICD-10-CM | POA: Diagnosis not present

## 2015-09-09 DIAGNOSIS — M8580 Other specified disorders of bone density and structure, unspecified site: Secondary | ICD-10-CM | POA: Diagnosis not present

## 2015-09-11 DIAGNOSIS — Z87891 Personal history of nicotine dependence: Secondary | ICD-10-CM | POA: Diagnosis not present

## 2015-09-11 DIAGNOSIS — Z96642 Presence of left artificial hip joint: Secondary | ICD-10-CM | POA: Diagnosis not present

## 2015-09-11 DIAGNOSIS — M8580 Other specified disorders of bone density and structure, unspecified site: Secondary | ICD-10-CM | POA: Diagnosis not present

## 2015-09-11 DIAGNOSIS — I10 Essential (primary) hypertension: Secondary | ICD-10-CM | POA: Diagnosis not present

## 2015-09-11 DIAGNOSIS — Z471 Aftercare following joint replacement surgery: Secondary | ICD-10-CM | POA: Diagnosis not present

## 2015-09-11 DIAGNOSIS — F0391 Unspecified dementia with behavioral disturbance: Secondary | ICD-10-CM | POA: Diagnosis not present

## 2015-09-14 DIAGNOSIS — I10 Essential (primary) hypertension: Secondary | ICD-10-CM | POA: Diagnosis not present

## 2015-09-14 DIAGNOSIS — M8580 Other specified disorders of bone density and structure, unspecified site: Secondary | ICD-10-CM | POA: Diagnosis not present

## 2015-09-14 DIAGNOSIS — Z87891 Personal history of nicotine dependence: Secondary | ICD-10-CM | POA: Diagnosis not present

## 2015-09-14 DIAGNOSIS — Z471 Aftercare following joint replacement surgery: Secondary | ICD-10-CM | POA: Diagnosis not present

## 2015-09-14 DIAGNOSIS — F0391 Unspecified dementia with behavioral disturbance: Secondary | ICD-10-CM | POA: Diagnosis not present

## 2015-09-14 DIAGNOSIS — Z96642 Presence of left artificial hip joint: Secondary | ICD-10-CM | POA: Diagnosis not present

## 2015-09-16 DIAGNOSIS — Z471 Aftercare following joint replacement surgery: Secondary | ICD-10-CM | POA: Diagnosis not present

## 2015-09-16 DIAGNOSIS — Z96642 Presence of left artificial hip joint: Secondary | ICD-10-CM | POA: Diagnosis not present

## 2015-09-17 DIAGNOSIS — F0391 Unspecified dementia with behavioral disturbance: Secondary | ICD-10-CM | POA: Diagnosis not present

## 2015-09-17 DIAGNOSIS — Z471 Aftercare following joint replacement surgery: Secondary | ICD-10-CM | POA: Diagnosis not present

## 2015-09-17 DIAGNOSIS — Z96642 Presence of left artificial hip joint: Secondary | ICD-10-CM | POA: Diagnosis not present

## 2015-09-17 DIAGNOSIS — Z87891 Personal history of nicotine dependence: Secondary | ICD-10-CM | POA: Diagnosis not present

## 2015-09-17 DIAGNOSIS — I10 Essential (primary) hypertension: Secondary | ICD-10-CM | POA: Diagnosis not present

## 2015-09-17 DIAGNOSIS — M8580 Other specified disorders of bone density and structure, unspecified site: Secondary | ICD-10-CM | POA: Diagnosis not present

## 2015-10-15 DIAGNOSIS — M25552 Pain in left hip: Secondary | ICD-10-CM | POA: Diagnosis not present

## 2015-10-15 DIAGNOSIS — Z471 Aftercare following joint replacement surgery: Secondary | ICD-10-CM | POA: Diagnosis not present

## 2015-10-15 DIAGNOSIS — Z96642 Presence of left artificial hip joint: Secondary | ICD-10-CM | POA: Diagnosis not present

## 2015-11-18 DIAGNOSIS — I10 Essential (primary) hypertension: Secondary | ICD-10-CM | POA: Diagnosis not present

## 2015-11-18 DIAGNOSIS — N390 Urinary tract infection, site not specified: Secondary | ICD-10-CM | POA: Diagnosis not present

## 2015-11-18 DIAGNOSIS — R8290 Unspecified abnormal findings in urine: Secondary | ICD-10-CM | POA: Diagnosis not present

## 2015-11-18 DIAGNOSIS — E784 Other hyperlipidemia: Secondary | ICD-10-CM | POA: Diagnosis not present

## 2015-11-18 DIAGNOSIS — E559 Vitamin D deficiency, unspecified: Secondary | ICD-10-CM | POA: Diagnosis not present

## 2015-11-25 DIAGNOSIS — Z6821 Body mass index (BMI) 21.0-21.9, adult: Secondary | ICD-10-CM | POA: Diagnosis not present

## 2015-11-25 DIAGNOSIS — F0280 Dementia in other diseases classified elsewhere without behavioral disturbance: Secondary | ICD-10-CM | POA: Diagnosis not present

## 2015-11-25 DIAGNOSIS — E559 Vitamin D deficiency, unspecified: Secondary | ICD-10-CM | POA: Diagnosis not present

## 2015-11-25 DIAGNOSIS — E784 Other hyperlipidemia: Secondary | ICD-10-CM | POA: Diagnosis not present

## 2015-11-25 DIAGNOSIS — M810 Age-related osteoporosis without current pathological fracture: Secondary | ICD-10-CM | POA: Diagnosis not present

## 2015-11-25 DIAGNOSIS — Z1389 Encounter for screening for other disorder: Secondary | ICD-10-CM | POA: Diagnosis not present

## 2015-11-25 DIAGNOSIS — I10 Essential (primary) hypertension: Secondary | ICD-10-CM | POA: Diagnosis not present

## 2015-11-25 DIAGNOSIS — Z0000 Encounter for general adult medical examination without abnormal findings: Secondary | ICD-10-CM | POA: Diagnosis not present

## 2015-11-26 DIAGNOSIS — Z471 Aftercare following joint replacement surgery: Secondary | ICD-10-CM | POA: Diagnosis not present

## 2015-11-26 DIAGNOSIS — Z96642 Presence of left artificial hip joint: Secondary | ICD-10-CM | POA: Diagnosis not present

## 2015-12-03 DIAGNOSIS — I10 Essential (primary) hypertension: Secondary | ICD-10-CM | POA: Diagnosis not present

## 2015-12-03 DIAGNOSIS — G309 Alzheimer's disease, unspecified: Secondary | ICD-10-CM | POA: Diagnosis not present

## 2015-12-03 DIAGNOSIS — Z96642 Presence of left artificial hip joint: Secondary | ICD-10-CM | POA: Diagnosis not present

## 2015-12-03 DIAGNOSIS — Z7982 Long term (current) use of aspirin: Secondary | ICD-10-CM | POA: Diagnosis not present

## 2015-12-03 DIAGNOSIS — R260 Ataxic gait: Secondary | ICD-10-CM | POA: Diagnosis not present

## 2015-12-03 DIAGNOSIS — Z87891 Personal history of nicotine dependence: Secondary | ICD-10-CM | POA: Diagnosis not present

## 2015-12-03 DIAGNOSIS — F0280 Dementia in other diseases classified elsewhere without behavioral disturbance: Secondary | ICD-10-CM | POA: Diagnosis not present

## 2015-12-03 DIAGNOSIS — M810 Age-related osteoporosis without current pathological fracture: Secondary | ICD-10-CM | POA: Diagnosis not present

## 2015-12-03 DIAGNOSIS — G308 Other Alzheimer's disease: Secondary | ICD-10-CM | POA: Diagnosis not present

## 2015-12-08 DIAGNOSIS — F0280 Dementia in other diseases classified elsewhere without behavioral disturbance: Secondary | ICD-10-CM | POA: Diagnosis not present

## 2015-12-08 DIAGNOSIS — I10 Essential (primary) hypertension: Secondary | ICD-10-CM | POA: Diagnosis not present

## 2015-12-08 DIAGNOSIS — G309 Alzheimer's disease, unspecified: Secondary | ICD-10-CM | POA: Diagnosis not present

## 2015-12-08 DIAGNOSIS — Z96642 Presence of left artificial hip joint: Secondary | ICD-10-CM | POA: Diagnosis not present

## 2015-12-08 DIAGNOSIS — R260 Ataxic gait: Secondary | ICD-10-CM | POA: Diagnosis not present

## 2015-12-08 DIAGNOSIS — M810 Age-related osteoporosis without current pathological fracture: Secondary | ICD-10-CM | POA: Diagnosis not present

## 2015-12-31 DIAGNOSIS — Z23 Encounter for immunization: Secondary | ICD-10-CM | POA: Diagnosis not present

## 2015-12-31 DIAGNOSIS — M79641 Pain in right hand: Secondary | ICD-10-CM | POA: Diagnosis not present

## 2015-12-31 DIAGNOSIS — I10 Essential (primary) hypertension: Secondary | ICD-10-CM | POA: Diagnosis not present

## 2015-12-31 DIAGNOSIS — M25461 Effusion, right knee: Secondary | ICD-10-CM | POA: Diagnosis not present

## 2015-12-31 DIAGNOSIS — Z6823 Body mass index (BMI) 23.0-23.9, adult: Secondary | ICD-10-CM | POA: Diagnosis not present

## 2016-01-11 DIAGNOSIS — M25561 Pain in right knee: Secondary | ICD-10-CM | POA: Diagnosis not present

## 2016-02-02 DIAGNOSIS — L570 Actinic keratosis: Secondary | ICD-10-CM | POA: Diagnosis not present

## 2016-02-02 DIAGNOSIS — D1801 Hemangioma of skin and subcutaneous tissue: Secondary | ICD-10-CM | POA: Diagnosis not present

## 2016-02-02 DIAGNOSIS — Z85828 Personal history of other malignant neoplasm of skin: Secondary | ICD-10-CM | POA: Diagnosis not present

## 2016-02-02 DIAGNOSIS — L821 Other seborrheic keratosis: Secondary | ICD-10-CM | POA: Diagnosis not present

## 2016-02-02 DIAGNOSIS — D225 Melanocytic nevi of trunk: Secondary | ICD-10-CM | POA: Diagnosis not present

## 2016-02-02 DIAGNOSIS — L814 Other melanin hyperpigmentation: Secondary | ICD-10-CM | POA: Diagnosis not present

## 2016-04-11 DIAGNOSIS — Z961 Presence of intraocular lens: Secondary | ICD-10-CM | POA: Diagnosis not present

## 2016-04-11 DIAGNOSIS — H21301 Idiopathic cysts of iris, ciliary body or anterior chamber, right eye: Secondary | ICD-10-CM | POA: Diagnosis not present

## 2016-04-11 DIAGNOSIS — H2512 Age-related nuclear cataract, left eye: Secondary | ICD-10-CM | POA: Diagnosis not present

## 2016-04-11 DIAGNOSIS — H524 Presbyopia: Secondary | ICD-10-CM | POA: Diagnosis not present

## 2016-06-05 IMAGING — CT CT ABD-PELV W/ CM
2 of 5 series · 17 of 46 positions shown, 19 images · IV contrast (Omnipaque 300)
Comparison: 06/29/2009

CLINICAL DATA: Periumbilical abdominal pain, nausea, and anorexia
for past year. 10 pound weight loss.

EXAM:
CT ABDOMEN AND PELVIS WITH CONTRAST
TECHNIQUE: Multidetector CT imaging of the abdomen and pelvis was performed
using the standard protocol following bolus administration of
intravenous contrast.
CONTRAST:  100mL OMNIPAQUE IOHEXOL 300 MG/ML  SOLN

[Series 2: abd/ pel 5mm · axial · 0.61mm/px · z∈[-473,-123]mm · 14 of 80 slices shown, 16 images]
[im 5/80  soft-tissue]
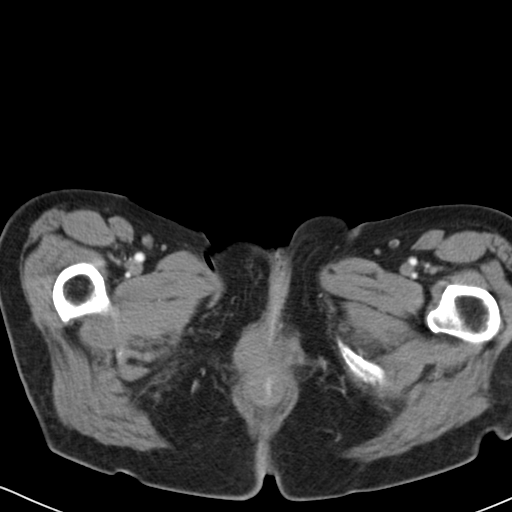
[im 5/80  bone]
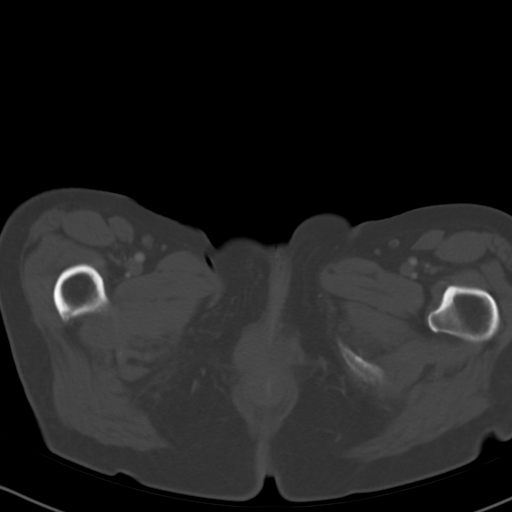
[im 9/80  soft-tissue]
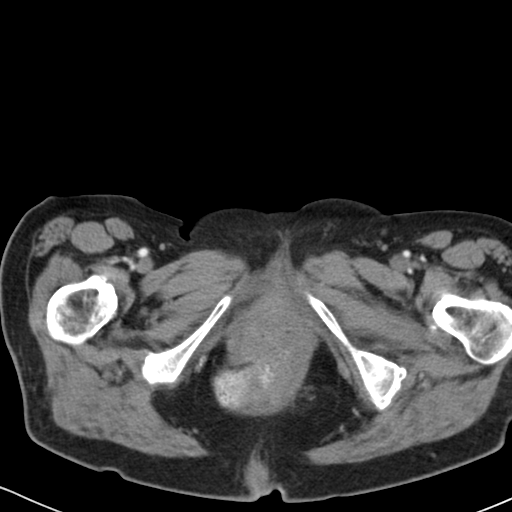
[im 17/80  soft-tissue]
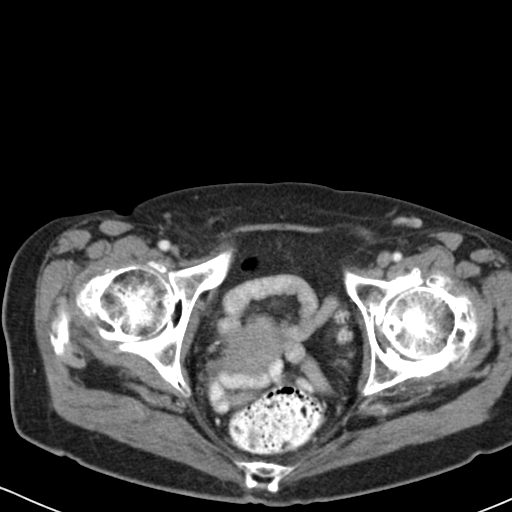
[im 21/80  soft-tissue]
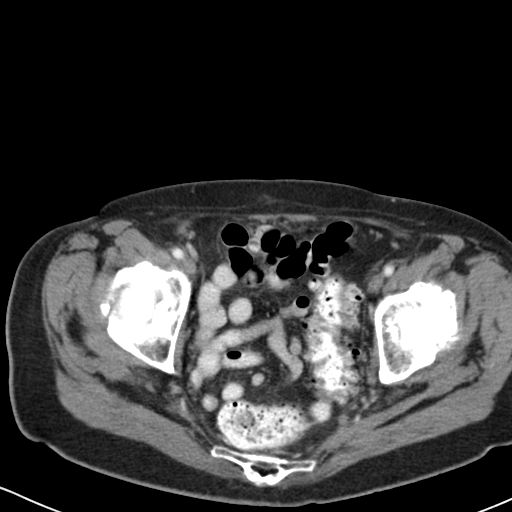
[im 25/80  soft-tissue]
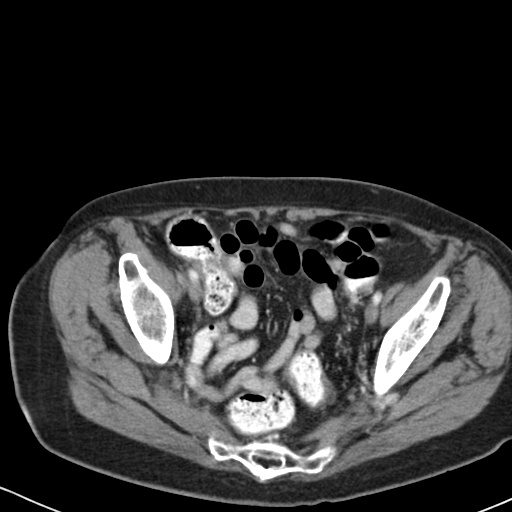
[im 34/80  soft-tissue]
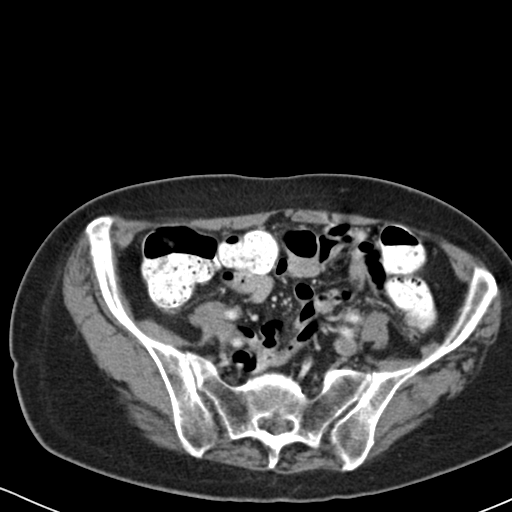
[im 38/80  soft-tissue]
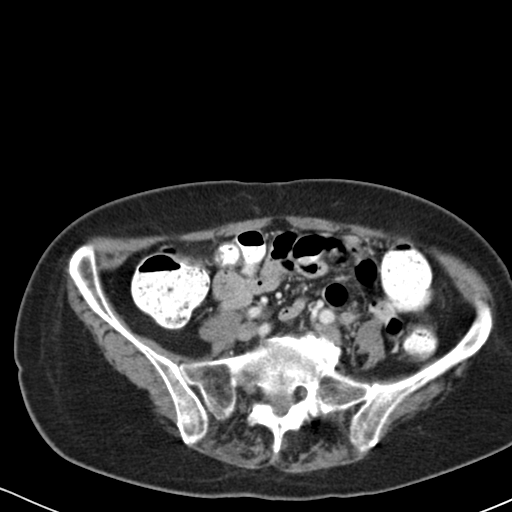
[im 42/80  soft-tissue]
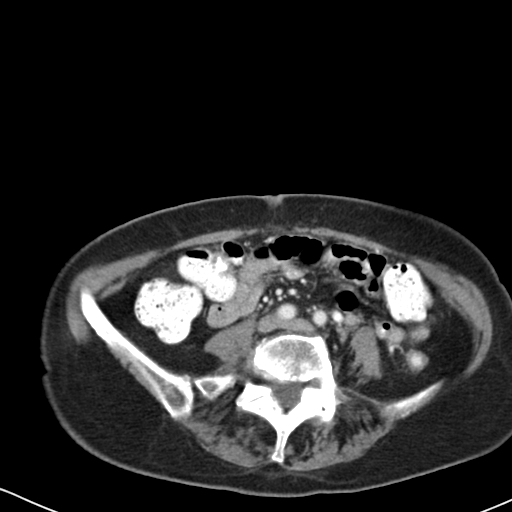
[im 46/80  soft-tissue]
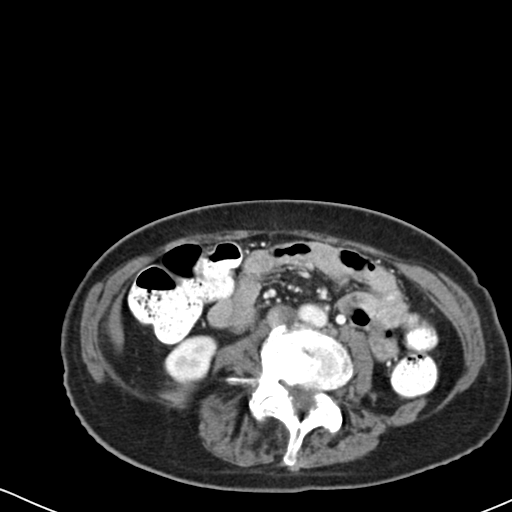
[im 46/80  bone]
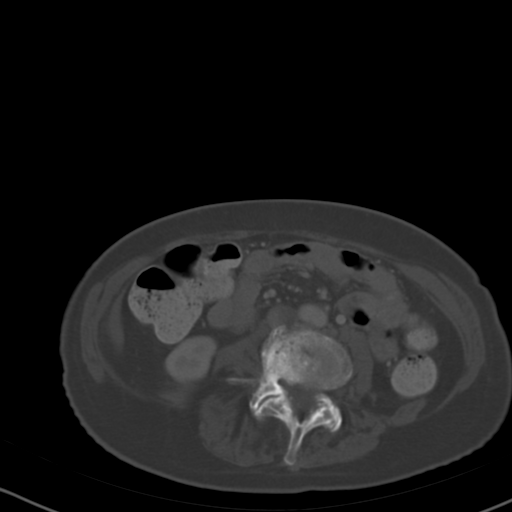
[im 55/80  soft-tissue]
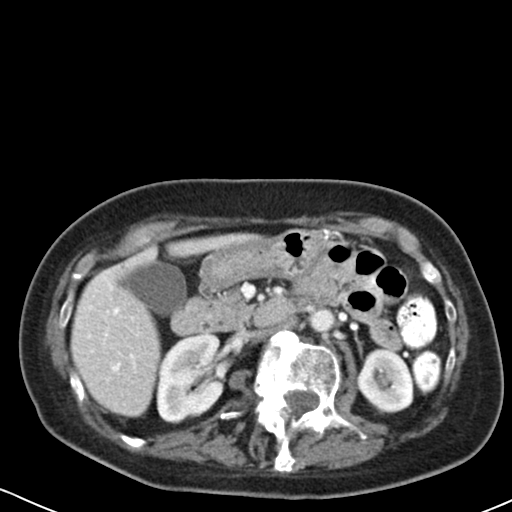
[im 59/80  soft-tissue]
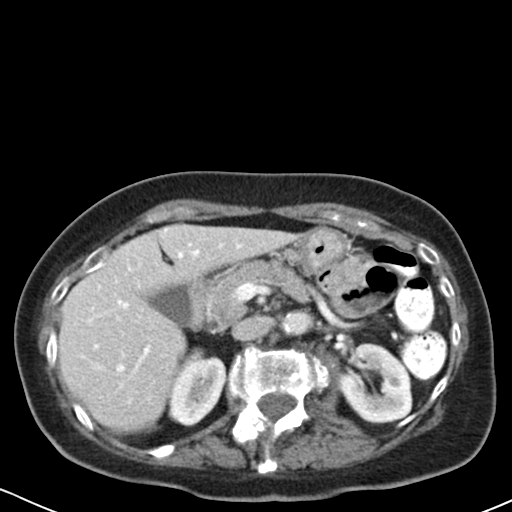
[im 63/80  soft-tissue]
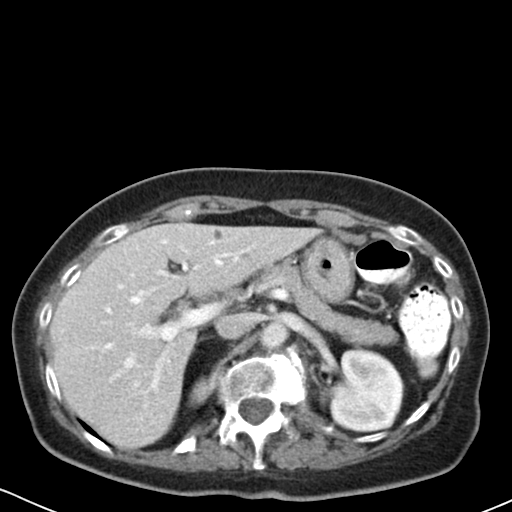
[im 71/80  soft-tissue]
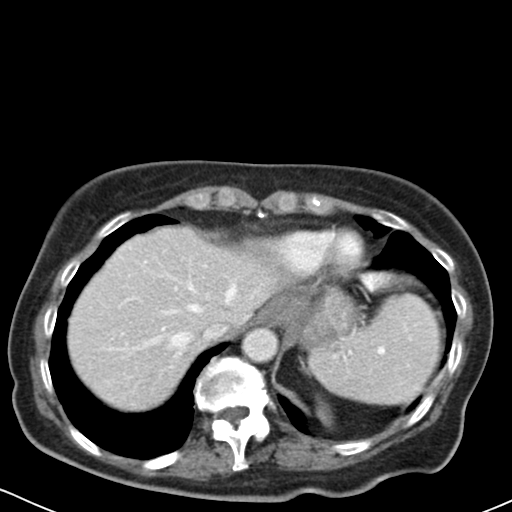
[im 75/80  soft-tissue]
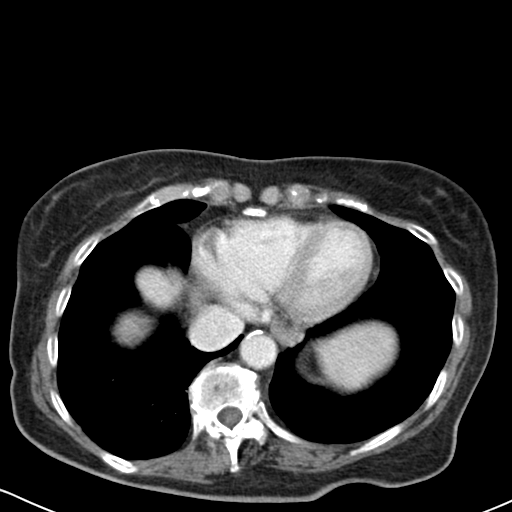

[Series 602: cor · coronal · 0.80mm/px · 3 of 99 slices shown]
[im 33/99  soft-tissue]
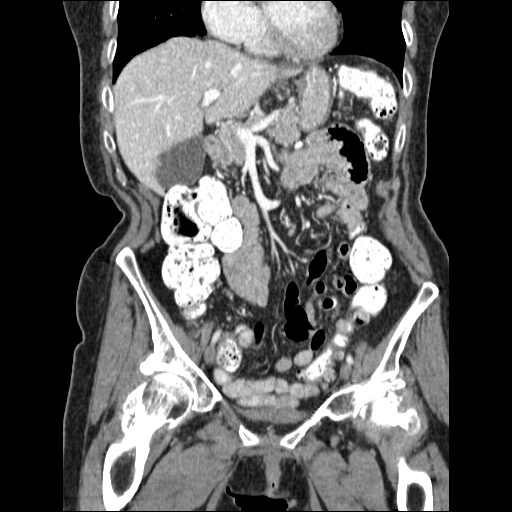
[im 44/99  soft-tissue]
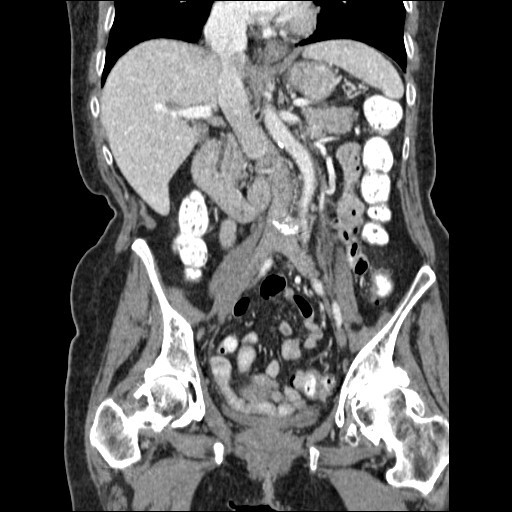
[im 55/99  soft-tissue]
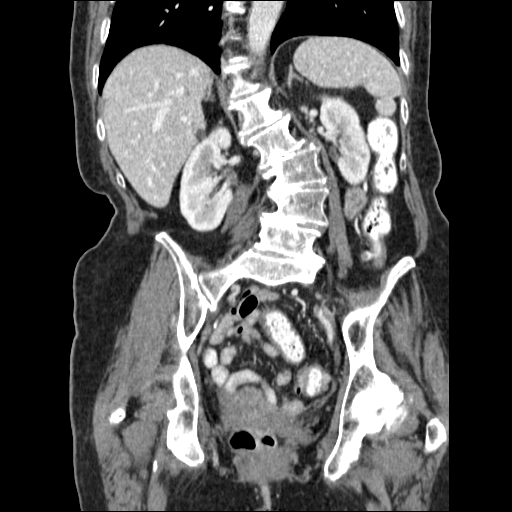

[17 of 46 positions shown; findings below may reference images not displayed]

FINDINGS: Lower chest:  No acute findings.

Hepatobiliary: Several tiny sub-cm hepatic cysts noted. No liver
masses identified. Gallbladder is unremarkable.

Pancreas: No mass, inflammatory changes, or other significant
abnormality.

Spleen: Within normal limits in size and appearance.

Adrenals/Urinary Tract: No masses identified. No evidence of
hydronephrosis.

Stomach/Bowel: No evidence of obstruction, inflammatory process, or
abnormal fluid collections. Diverticulosis seen involving the
descending and sigmoid colon, however there is no evidence of
diverticulitis.

Vascular/Lymphatic: No pathologically enlarged lymph nodes. No
evidence of abdominal aortic aneurysm.

Reproductive: No mass or other significant abnormality.

Other: None.

Musculoskeletal: No suspicious bone lesions identified. Advanced
thoraco lumbar spine degenerative changes and scoliosis noted.
IMPRESSION: Colonic diverticulosis. No radiographic evidence of diverticulitis
or other acute findings.

## 2016-06-16 DIAGNOSIS — Z6824 Body mass index (BMI) 24.0-24.9, adult: Secondary | ICD-10-CM | POA: Diagnosis not present

## 2016-06-16 DIAGNOSIS — R05 Cough: Secondary | ICD-10-CM | POA: Diagnosis not present

## 2016-06-16 DIAGNOSIS — I10 Essential (primary) hypertension: Secondary | ICD-10-CM | POA: Diagnosis not present

## 2016-06-16 DIAGNOSIS — E559 Vitamin D deficiency, unspecified: Secondary | ICD-10-CM | POA: Diagnosis not present

## 2016-06-16 DIAGNOSIS — E784 Other hyperlipidemia: Secondary | ICD-10-CM | POA: Diagnosis not present

## 2016-06-16 DIAGNOSIS — F0280 Dementia in other diseases classified elsewhere without behavioral disturbance: Secondary | ICD-10-CM | POA: Diagnosis not present

## 2016-06-16 DIAGNOSIS — M810 Age-related osteoporosis without current pathological fracture: Secondary | ICD-10-CM | POA: Diagnosis not present

## 2016-07-12 ENCOUNTER — Encounter (HOSPITAL_COMMUNITY): Payer: Self-pay

## 2016-07-12 ENCOUNTER — Ambulatory Visit (HOSPITAL_COMMUNITY)
Admission: RE | Admit: 2016-07-12 | Discharge: 2016-07-12 | Disposition: A | Discharge status: Home / Self Care | Payer: Medicare Other | Source: Ambulatory Visit | Attending: Internal Medicine | Admitting: Internal Medicine

## 2016-07-12 DIAGNOSIS — M810 Age-related osteoporosis without current pathological fracture: Secondary | ICD-10-CM | POA: Insufficient documentation

## 2016-07-12 MED ORDER — ZOLEDRONIC ACID 5 MG/100ML IV SOLN
5.0000 mg | Freq: Once | INTRAVENOUS | Status: AC
  Administered 2016-07-12: 5 mg via INTRAVENOUS
  Filled 2016-07-12: qty 100

## 2016-07-12 MED ORDER — SODIUM CHLORIDE 0.9 % IV SOLN
INTRAVENOUS | Status: DC
  Administered 2016-07-12: 13:00:00 via INTRAVENOUS

## 2016-07-12 NOTE — Discharge Instructions (Signed)
Drink  Fluids/water as tolerated over the next 72 hours Tylenol or ibuprofen if needed for aches and pains next few days - call MD if severe Continue Calcium and Vit D as directed by your MD Call MD for any problems or questions      Reclast Zoledronic Acid injection (Paget's Disease, Osteoporosis) What is this medicine? ZOLEDRONIC ACID (ZOE le dron ik AS id) lowers the amount of calcium loss from bone. It is used to treat Paget's disease and osteoporosis in women. This medicine may be used for other purposes; ask your health care provider or pharmacist if you have questions. COMMON BRAND NAME(S): Reclast, Zometa What should I tell my health care provider before I take this medicine? They need to know if you have any of these conditions: -aspirin-sensitive asthma -cancer, especially if you are receiving medicines used to treat cancer -dental disease or wear dentures -infection -kidney disease -low levels of calcium in the blood -past surgery on the parathyroid gland or intestines -receiving corticosteroids like dexamethasone or prednisone -an unusual or allergic reaction to zoledronic acid, other medicines, foods, dyes, or preservatives -pregnant or trying to get pregnant -breast-feeding How should I use this medicine? This medicine is for infusion into a vein. It is given by a health care professional in a hospital or clinic setting. Talk to your pediatrician regarding the use of this medicine in children. This medicine is not approved for use in children. Overdosage: If you think you have taken too much of this medicine contact a poison control center or emergency room at once. NOTE: This medicine is only for you. Do not share this medicine with others. What if I miss a dose? It is important not to miss your dose. Call your doctor or health care professional if you are unable to keep an appointment. What may interact with this medicine? -certain antibiotics given by  injection -NSAIDs, medicines for pain and inflammation, like ibuprofen or naproxen -some diuretics like bumetanide, furosemide -teriparatide This list may not describe all possible interactions. Give your health care provider a list of all the medicines, herbs, non-prescription drugs, or dietary supplements you use. Also tell them if you smoke, drink alcohol, or use illegal drugs. Some items may interact with your medicine. What should I watch for while using this medicine? Visit your doctor or health care professional for regular checkups. It may be some time before you see the benefit from this medicine. Do not stop taking your medicine unless your doctor tells you to. Your doctor may order blood tests or other tests to see how you are doing. Women should inform their doctor if they wish to become pregnant or think they might be pregnant. There is a potential for serious side effects to an unborn child. Talk to your health care professional or pharmacist for more information. You should make sure that you get enough calcium and vitamin D while you are taking this medicine. Discuss the foods you eat and the vitamins you take with your health care professional. Some people who take this medicine have severe bone, joint, and/or muscle pain. This medicine may also increase your risk for jaw problems or a broken thigh bone. Tell your doctor right away if you have severe pain in your jaw, bones, joints, or muscles. Tell your doctor if you have any pain that does not go away or that gets worse. Tell your dentist and dental surgeon that you are taking this medicine. You should not have major  dental surgery while on this medicine. See your dentist to have a dental exam and fix any dental problems before starting this medicine. Take good care of your teeth while on this medicine. Make sure you see your dentist for regular follow-up appointments. What side effects may I notice from receiving this medicine? Side  effects that you should report to your doctor or health care professional as soon as possible: -allergic reactions like skin rash, itching or hives, swelling of the face, lips, or tongue -anxiety, confusion, or depression -breathing problems -changes in vision -eye pain -feeling faint or lightheaded, falls -jaw pain, especially after dental work -mouth sores -muscle cramps, stiffness, or weakness -redness, blistering, peeling or loosening of the skin, including inside the mouth -trouble passing urine or change in the amount of urine Side effects that usually do not require medical attention (report to your doctor or health care professional if they continue or are bothersome): -bone, joint, or muscle pain -constipation -diarrhea -fever -hair loss -irritation at site where injected -loss of appetite -nausea, vomiting -stomach upset -trouble sleeping -trouble swallowing -weak or tired This list may not describe all possible side effects. Call your doctor for medical advice about side effects. You may report side effects to FDA at 1-800-FDA-1088. Where should I keep my medicine? This drug is given in a hospital or clinic and will not be stored at home. NOTE: This sheet is a summary. It may not cover all possible information. If you have questions about this medicine, talk to your doctor, pharmacist, or health care provider.  2018 Elsevier/Gold Standard (2013-09-07 14:19:57)

## 2016-07-19 DIAGNOSIS — J069 Acute upper respiratory infection, unspecified: Secondary | ICD-10-CM | POA: Diagnosis not present

## 2016-07-19 DIAGNOSIS — R05 Cough: Secondary | ICD-10-CM | POA: Diagnosis not present

## 2016-07-19 DIAGNOSIS — R8299 Other abnormal findings in urine: Secondary | ICD-10-CM | POA: Diagnosis not present

## 2016-07-19 DIAGNOSIS — R5081 Fever presenting with conditions classified elsewhere: Secondary | ICD-10-CM | POA: Diagnosis not present

## 2016-07-20 DIAGNOSIS — R809 Proteinuria, unspecified: Secondary | ICD-10-CM | POA: Insufficient documentation

## 2016-07-20 DIAGNOSIS — R808 Other proteinuria: Secondary | ICD-10-CM | POA: Diagnosis not present

## 2016-07-20 DIAGNOSIS — R8299 Other abnormal findings in urine: Secondary | ICD-10-CM | POA: Diagnosis not present

## 2016-08-01 ENCOUNTER — Encounter: Payer: Self-pay | Admitting: Cardiology

## 2016-08-03 ENCOUNTER — Other Ambulatory Visit: Payer: Self-pay | Admitting: Cardiology

## 2016-08-03 NOTE — Telephone Encounter (Signed)
Rx(s) sent to pharmacy electronically.  

## 2016-08-10 NOTE — Progress Notes (Deleted)
HPI: FU hypertension and hyperlipidemia. An echocardiogram performed in November of 2008 showed normal LV function. There was trivial aortic insufficiency. Since I last saw her,   Current Outpatient Prescriptions  Medication Sig Dispense Refill  . Calcium Carbonate-Vit D-Min (CALCIUM 1200 PO) Take 1 tablet by mouth daily.     . cetirizine (ZYRTEC) 10 MG tablet Take 1 tablet (10 mg total) by mouth daily. (Patient taking differently: Take 10 mg by mouth daily as needed for allergies. ) 30 tablet 11  . Cholecalciferol (VITAMIN D3) 2000 units TABS Take 2,000 Units by mouth daily.    Marland Kitchen docusate sodium (COLACE) 100 MG capsule Take 1 capsule (100 mg total) by mouth 2 (two) times daily. 10 capsule 0  . escitalopram (LEXAPRO) 5 MG tablet TAKE 1 TABLET BY MOUTH DAILY*CALL OFFICE TO MAKE APPOINTMENT* (Patient taking differently: TAKE 1 TABLET BY MOUTH DAILY) 30 tablet 0  . ferrous sulfate 325 (65 FE) MG tablet Take 1 tablet (325 mg total) by mouth 3 (three) times daily after meals.  3  . galantamine (RAZADYNE ER) 24 MG 24 hr capsule TAKE 1 CAPSULE BY MOUTH EVERY DAY 90 capsule 1  . HYDROcodone-acetaminophen (NORCO) 7.5-325 MG tablet Take 1-2 tablets by mouth every 4 (four) hours as needed for moderate pain. 100 tablet 0  . Multiple Vitamin (MULTIVITAMIN WITH MINERALS) TABS tablet Take 1 tablet by mouth daily.    Marland Kitchen NAMENDA XR 28 MG CP24 24 hr capsule TAKE 1 CAPSULE(28 MG) BY MOUTH DAILY 30 capsule 5  . polyethylene glycol (MIRALAX / GLYCOLAX) packet Take 17 g by mouth 2 (two) times daily. 14 each 0  . ranitidine (PX RANITIDINE) 75 MG tablet Take 1 tablet (75 mg total) by mouth at bedtime. (Patient taking differently: Take 75 mg by mouth 2 (two) times daily as needed for heartburn. )    . rosuvastatin (CRESTOR) 40 MG tablet TAKE 1 TABLET BY MOUTH DAILY 90 tablet 0  . tiZANidine (ZANAFLEX) 4 MG tablet Take 1 tablet (4 mg total) by mouth every 6 (six) hours as needed for muscle spasms. 40 tablet 0  .  traMADol (ULTRAM) 50 MG tablet Take one tablet by mouth three times daily as needed for moderate to severe pain (Patient taking differently: Take 50 mg by mouth 3 (three) times daily as needed for moderate pain or severe pain. ) 50 tablet 0  . valsartan (DIOVAN) 160 MG tablet TAKE 1 TABLET(160 MG) BY MOUTH DAILY 90 tablet 1   No current facility-administered medications for this visit.      Past Medical History:  Diagnosis Date  . Arthritis   . Disturbances of sensation of smell and taste   . Essential hypertension    a.  02/2007 Echo: Nl EF, triv AI.  Marland Kitchen GERD (gastroesophageal reflux disease)   . Lichen sclerosus   . Lipoma of face   . Memory loss   . Mixed hyperlipidemia   . Osteopenia   . Other persistent mental disorders due to conditions classified elsewhere     Past Surgical History:  Procedure Laterality Date  . CATARACT EXTRACTION, BILATERAL    . OOPHORECTOMY  1988   DIAG LAP W BSO  . TONSILECTOMY, ADENOIDECTOMY, BILATERAL MYRINGOTOMY AND TUBES    . TOTAL HIP ARTHROPLASTY Left 09/01/2015   Procedure: LEFT TOTAL HIP ARTHROPLASTY ANTERIOR APPROACH;  Surgeon: Paralee Cancel, MD;  Location: [facility];  Service: Orthopedics;  Laterality: Left;    Social History   Social History  .  Marital status: Married    Spouse name: N/A  . Number of children: 3  . Years of education: N/A   Occupational History  . retired travel agent     full time   Social History Main Topics  . Smoking status: Former Smoker    Quit date: 09/13/2002  . Smokeless tobacco: Never Used  . Alcohol use 12.6 oz/week    14 Glasses of wine, 7 Standard drinks or equivalent per week     Comment: 2 glasses of wine each evening  . Drug use: No  . Sexual activity: No   Other Topics Concern  . Not on file   Social History Narrative   This patient is widowed. She lives alone  in a single level  home in [place], [place] from [place] to assist with meals and  transportation.    Patient is retired Garment/textile technologist.    Main exercise is walking, she has a Programmer, systems.   Stopped smoking 20 years ago, drinks 1-2 glasses of wine a  day   Patient has 2 sons and one daughter, all live out of town.   Has a Living Will, DO NOT RESUSCITATE.    Family History  Problem Relation Age of Onset  . Cancer Mother     OVARIAN  . Diabetes Father   . Hypertension Father     ROS: no fevers or chills, productive cough, hemoptysis, dysphasia, odynophagia, melena, hematochezia, dysuria, hematuria, rash, seizure activity, orthopnea, PND, pedal edema, claudication. Remaining systems are negative.  Physical Exam: Well-developed well-nourished in no acute distress.  Skin is warm and dry.  HEENT is normal.  Neck is supple.  Chest is clear to auscultation with normal expansion.  Cardiovascular exam is regular rate and rhythm.  Abdominal exam nontender or distended. No masses palpated. Extremities show no edema. neuro grossly intact  ECG- personally reviewed  A/P  1  Kirk Ruths, MD

## 2016-08-12 DIAGNOSIS — M545 Low back pain: Secondary | ICD-10-CM | POA: Diagnosis not present

## 2016-08-18 ENCOUNTER — Ambulatory Visit: Payer: Medicare Other | Admitting: Cardiology

## 2016-08-18 DIAGNOSIS — E784 Other hyperlipidemia: Secondary | ICD-10-CM | POA: Diagnosis not present

## 2016-09-04 NOTE — Progress Notes (Signed)
Cardiology Office Note    Date:  09/05/2016   ID:  Margaret [name], DOB [birth year], MRN [id number]  PCP:  Marton Redwood, MD  Cardiologist: Dr. Stanford Breed   Chief Complaint  Patient presents with  . Follow-up    Annual Visit    History of Present Illness:    Margaret [name] is a 82 y.o. female with past medical history of HTN, HLD, GERD and dementia who presents to the office today for her annual follow-up appointment.   She was last examined by Ignacia Bayley, NP in 07/2015 for preoperative cardiac clearance in regards to left hip replacement. She denied any recent chest discomfort or dyspnea with exertion. Reported walking 6 blocks multiple times per week without anginal symptoms. With her EKG showing no acute changes, she was felt to be low-risk from a cardiac perspective and underwent surgery in 08/2015 with no post-operative complications noted.   In talking with the patient today, she reports doing well from a cardiac perspective. Most history is provided by her caregiver who is present 24/7 and has been with her for over 4 years. They walk up to a mile per day and she denies any exertional symptoms with this. She has started to experience back pain along with right hip pain which causes her to be slightly less active than she was years ago.   She checks her BP regularly at home and reports SBP is usually in the 120's - 130's. Valsartan was recently increased from 80mg  daily to 160mg  daily. She does consume 4 ounces of wine nightly.    Past Medical History:  Diagnosis Date  . Arthritis   . Disturbances of sensation of smell and taste   . Essential hypertension    a.  02/2007 Echo: Nl EF, triv AI.  Marland Kitchen GERD (gastroesophageal reflux disease)   . Lichen sclerosus   . Lipoma of face   . Memory loss   . Mixed hyperlipidemia   . Osteopenia   . Other persistent mental disorders due to conditions classified elsewhere     Past Surgical History:  Procedure Laterality Date  .  CATARACT EXTRACTION, BILATERAL    . OOPHORECTOMY  1988   DIAG LAP W BSO  . TONSILECTOMY, ADENOIDECTOMY, BILATERAL MYRINGOTOMY AND TUBES    . TOTAL HIP ARTHROPLASTY Left 09/01/2015   Procedure: LEFT TOTAL HIP ARTHROPLASTY ANTERIOR APPROACH;  Surgeon: Paralee Cancel, MD;  Location: [facility];  Service: Orthopedics;  Laterality: Left;    Current Medications: Outpatient Medications Prior to Visit  Medication Sig Dispense Refill  . Calcium Carbonate-Vit D-Min (CALCIUM 1200 PO) Take 1 tablet by mouth daily.     . cetirizine (ZYRTEC) 10 MG tablet Take 1 tablet (10 mg total) by mouth daily. (Patient taking differently: Take 10 mg by mouth daily as needed for allergies. ) 30 tablet 11  . Cholecalciferol (VITAMIN D3) 2000 units TABS Take 2,000 Units by mouth daily.    Marland Kitchen docusate sodium (COLACE) 100 MG capsule Take 1 capsule (100 mg total) by mouth 2 (two) times daily. 10 capsule 0  . escitalopram (LEXAPRO) 5 MG tablet TAKE 1 TABLET BY MOUTH DAILY*CALL OFFICE TO MAKE APPOINTMENT* (Patient taking differently: TAKE 1 TABLET BY MOUTH DAILY) 30 tablet 0  . galantamine (RAZADYNE ER) 24 MG 24 hr capsule TAKE 1 CAPSULE BY MOUTH EVERY DAY 90 capsule 1  . Multiple Vitamin (MULTIVITAMIN WITH MINERALS) TABS tablet Take 1 tablet by mouth daily.    Marland Kitchen NAMENDA XR 28 MG CP24 24  hr capsule TAKE 1 CAPSULE(28 MG) BY MOUTH DAILY 30 capsule 5  . ranitidine (PX RANITIDINE) 75 MG tablet Take 1 tablet (75 mg total) by mouth at bedtime. (Patient taking differently: Take 75 mg by mouth 2 (two) times daily as needed for heartburn. )    . rosuvastatin (CRESTOR) 40 MG tablet TAKE 1 TABLET BY MOUTH DAILY 90 tablet 0  . valsartan (DIOVAN) 160 MG tablet TAKE 1 TABLET(160 MG) BY MOUTH DAILY 90 tablet 1  . ferrous sulfate 325 (65 FE) MG tablet Take 1 tablet (325 mg total) by mouth 3 (three) times daily after meals. (Patient not taking: Reported on 09/05/2016)  3  . HYDROcodone-acetaminophen (NORCO) 7.5-325 MG tablet Take 1-2 tablets by mouth  every 4 (four) hours as needed for moderate pain. (Patient not taking: Reported on 09/05/2016) 100 tablet 0  . polyethylene glycol (MIRALAX / GLYCOLAX) packet Take 17 g by mouth 2 (two) times daily. (Patient not taking: Reported on 09/05/2016) 14 each 0  . tiZANidine (ZANAFLEX) 4 MG tablet Take 1 tablet (4 mg total) by mouth every 6 (six) hours as needed for muscle spasms. (Patient not taking: Reported on 09/05/2016) 40 tablet 0  . traMADol (ULTRAM) 50 MG tablet Take one tablet by mouth three times daily as needed for moderate to severe pain (Patient not taking: Reported on 09/05/2016) 50 tablet 0   No facility-administered medications prior to visit.      Allergies:   Sulfa antibiotics   Social History   Social History  . Marital status: Married    Spouse name: N/A  . Number of children: 3  . Years of education: N/A   Occupational History  . retired travel agent     full time   Social History Main Topics  . Smoking status: Former Smoker    Quit date: 09/13/2002  . Smokeless tobacco: Never Used  . Alcohol use 12.6 oz/week    14 Glasses of wine, 7 Standard drinks or equivalent per week     Comment: 2 glasses of wine each evening  . Drug use: No  . Sexual activity: No   Other Topics Concern  . None   Social History Narrative   This patient is widowed. She lives alone  in a single level  home in [place], [place] from [place] to assist with meals and transportation.    Patient is retired Garment/textile technologist.    Main exercise is walking, she has a Programmer, systems.   Stopped smoking 20 years ago, drinks 1-2 glasses of wine a  day   Patient has 2 sons and one daughter, all live out of town.   Has a Living Will, DO NOT RESUSCITATE.     Family History:  The patient's family history includes Cancer in her mother; Diabetes in her father; Hypertension in her father.   Review of Systems:   Please see the history of present illness.     General:  No chills,  fever, night sweats or weight changes.  Cardiovascular:  No chest pain, dyspnea on exertion, edema, orthopnea, palpitations, paroxysmal nocturnal dyspnea. Dermatological: No rash, lesions/masses Respiratory: No cough, dyspnea Urologic: No hematuria, dysuria MSK: Positive for back pain.  Abdominal:   No nausea, vomiting, diarrhea, bright red blood per rectum, melena, or hematemesis Neurologic:  No visual changes, wkns, changes in mental status. All other systems reviewed and are otherwise negative except as noted above.   Physical Exam:    VS:  BP 134/90  Pulse 72   Ht 4\' 11"  (1.499 m)   Wt 119 lb (54 kg)   BMI 24.04 kg/m    General: Well developed, well nourished, elderly Caucasian female appearing in no acute distress. Head: Normocephalic, atraumatic, sclera non-icteric, no xanthomas, nares are without discharge.  Neck: No carotid bruits. JVD not elevated.  Lungs: Respirations regular and unlabored, without wheezes or rales.  Heart: Regular rate and rhythm. No S3 or S4.  No murmur, no rubs, or gallops appreciated. Abdomen: Soft, non-tender, non-distended with normoactive bowel sounds. No hepatomegaly. No rebound/guarding. No obvious abdominal masses. Msk:  Strength and tone appear normal for age. No joint deformities or effusions. Extremities: No clubbing or cyanosis. No lower extremity edema.  Distal pedal pulses are 2+ bilaterally. Neuro: Alert and oriented X 2 (person, place). She cannot recall the year. Moves all extremities spontaneously. No focal deficits noted. Psych:  Responds to questions appropriately with a normal affect. Skin: No rashes or lesions noted  Wt Readings from Last 3 Encounters:  09/05/16 119 lb (54 kg)  07/12/16 122 lb (55.3 kg)  09/01/15 109 lb (49.4 kg)    Studies/Labs Reviewed:   EKG:  EKG is ordered today.  The ekg ordered today demonstrates NSR, HR 73, with no acute ST or T-wave changes when compared to prior tracings.   Recent Labs: No results  found for requested labs within last 8760 hours.   Lipid Panel    Component Value Date/Time   CHOL 294 (H) 06/02/2014 0926   TRIG 132 06/02/2014 0926   HDL 89 06/02/2014 0926   CHOLHDL 3.3 06/02/2014 0926   CHOLHDL 3.2 04/07/2014 1408   VLDL 17 04/07/2014 1408   LDLCALC 179 (H) 06/02/2014 0926   LDLDIRECT 126.8 10/05/2010 0912    Additional studies/ records that were reviewed today include:   Echocardiogram: 02/2007 SUMMARY - Overall left ventricular systolic function was normal. Left   ventricular ejection fraction was estimated to be 60 %. There   were no left ventricular regional wall motion abnormalities. - There was trivial aortic valvular regurgitation.  Assessment:    1. Essential hypertension   2. Hyperlipidemia, unspecified hyperlipidemia type   3. Gastroesophageal reflux disease, esophagitis presence not specified   4. Dementia without behavioral disturbance, unspecified dementia type      Plan:   In order of problems listed above:  1. HTN - BP at 134/90 during today's visit. Reports SBP is usually in the 120's - 130's. Valsartan was recently increased from 80mg  daily to 160mg  daily. Can increase to 320mg  daily if BP becomes elevated but will keep at 160mg  daily at this time. - she denies any recent chest pain or dyspnea on exertion, as she walks 1+ miles per day without anginal symptoms. EKG is without acute ischemic changes. No further ischemic testing is indicated at this time.   2. HLD - she remains on Crestor 40mg  daily.  - followed by PCP.   3. GERD - she denies any recent reflux symptoms. - continue Zantac 75mg  daily.   4. Dementia - has a care-giver present 24/7.  - she is A&Ox2 today (person and place) but is unable to recall the year or recent events.    Medication Adjustments/Labs and Tests Ordered: Current medicines are reviewed at length with the patient today.  Concerns regarding medicines are outlined above.  Medication  changes, Labs and Tests ordered today are listed in the Patient Instructions below. Patient Instructions  Your physician recommends that you schedule a follow-up  appointment in: 1 year with Dr. Stanford Breed.    Signed, Erma Heritage, PA-C  09/05/2016 3:24 PM    [facility] [address], [place], [place]  [address] Phone: [phone]; Fax: [phone]  [address] [place], [place] [address] Phone: [phone]

## 2016-09-05 ENCOUNTER — Ambulatory Visit (INDEPENDENT_AMBULATORY_CARE_PROVIDER_SITE_OTHER): Payer: Medicare Other | Admitting: Student

## 2016-09-05 ENCOUNTER — Encounter: Payer: Self-pay | Admitting: Student

## 2016-09-05 VITALS — BP 134/90 | HR 72 | Ht 59.0 in | Wt 119.0 lb

## 2016-09-05 DIAGNOSIS — E785 Hyperlipidemia, unspecified: Secondary | ICD-10-CM

## 2016-09-05 DIAGNOSIS — F0390 Unspecified dementia without behavioral disturbance: Secondary | ICD-10-CM | POA: Diagnosis not present

## 2016-09-05 DIAGNOSIS — K219 Gastro-esophageal reflux disease without esophagitis: Secondary | ICD-10-CM

## 2016-09-05 DIAGNOSIS — I10 Essential (primary) hypertension: Secondary | ICD-10-CM

## 2016-09-05 MED ORDER — ROSUVASTATIN CALCIUM 40 MG PO TABS: 40.0000 mg | ORAL_TABLET | Freq: Every day | ORAL | 3 refills | Status: DC

## 2016-09-05 MED ORDER — VALSARTAN 160 MG PO TABS: 160.0000 mg | ORAL_TABLET | Freq: Every day | ORAL | 3 refills | Status: DC

## 2016-09-05 NOTE — Patient Instructions (Signed)
Your physician recommends that you schedule a follow-up appointment in: 1 year with Dr. Stanford Breed.

## 2016-09-23 ENCOUNTER — Telehealth: Payer: Self-pay | Admitting: Cardiology

## 2016-09-23 NOTE — Telephone Encounter (Signed)
Returned call to caregiver (ok per DPR)-reports there was a misunderstanding at [facility]-patient was already taking 360mg  valsartan. Reports Dr. Brigitte Pulse increased this about 6 months ago.  Reports her BP has been running the same as it has been (120s-130s).  Reports she will call if it starts to run higher.  Will update med list.  Routed to [place] PA to make aware and for further recommendations if needed.    5/14 with [place] PA:  1. HTN - BP at 134/90 during today's visit. Reports SBP is usually in the 120's - 130's. Valsartan was recently increased from 80mg  daily to 160mg  daily. Can increase to 320mg  daily if BP becomes elevated but will keep at 160mg  daily at this time. - she denies any recent chest pain or dyspnea on exertion, as she walks 1+ miles per day without anginal symptoms. EKG is without acute ischemic changes. No further ischemic testing is indicated at this time.

## 2016-09-23 NOTE — Telephone Encounter (Signed)
Thank you for the FYI and updating her med list. No further changes at this time. Continue on her current medication regimen as BP appears well-controlled at this time.

## 2016-09-23 NOTE — Telephone Encounter (Signed)
Margaret [name]( Caregiver) is calling because the dosage on her Valsartan was wrong it was prescribe for 160 mg once a day and it should be 320mg  once a day . Please call   Thanks

## 2016-11-04 ENCOUNTER — Other Ambulatory Visit: Payer: Self-pay | Admitting: Cardiology

## 2016-11-10 IMAGING — DX DG HIP (WITH OR WITHOUT PELVIS) 1V PORT*L*
2 series · 2 of 2 positions shown · non-contrast
Comparison: September 17, 2014.

CLINICAL DATA: Status post left total hip replacement.

EXAM:
DG HIP (WITH OR WITHOUT PELVIS) 1V PORT LEFT

[pelvis ap]
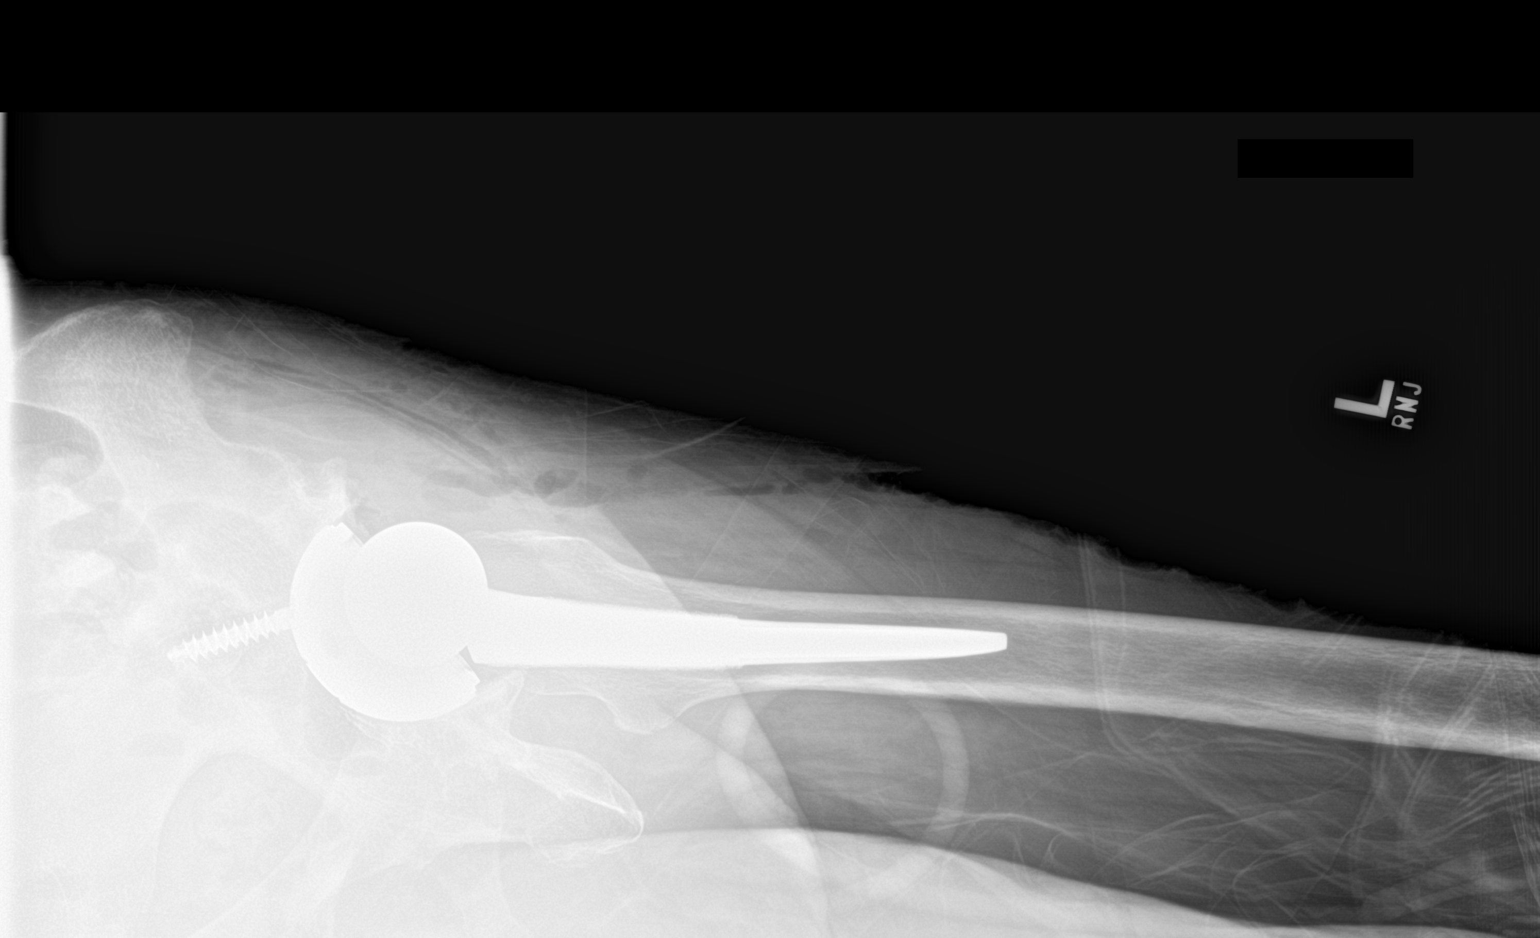

[hip lat]
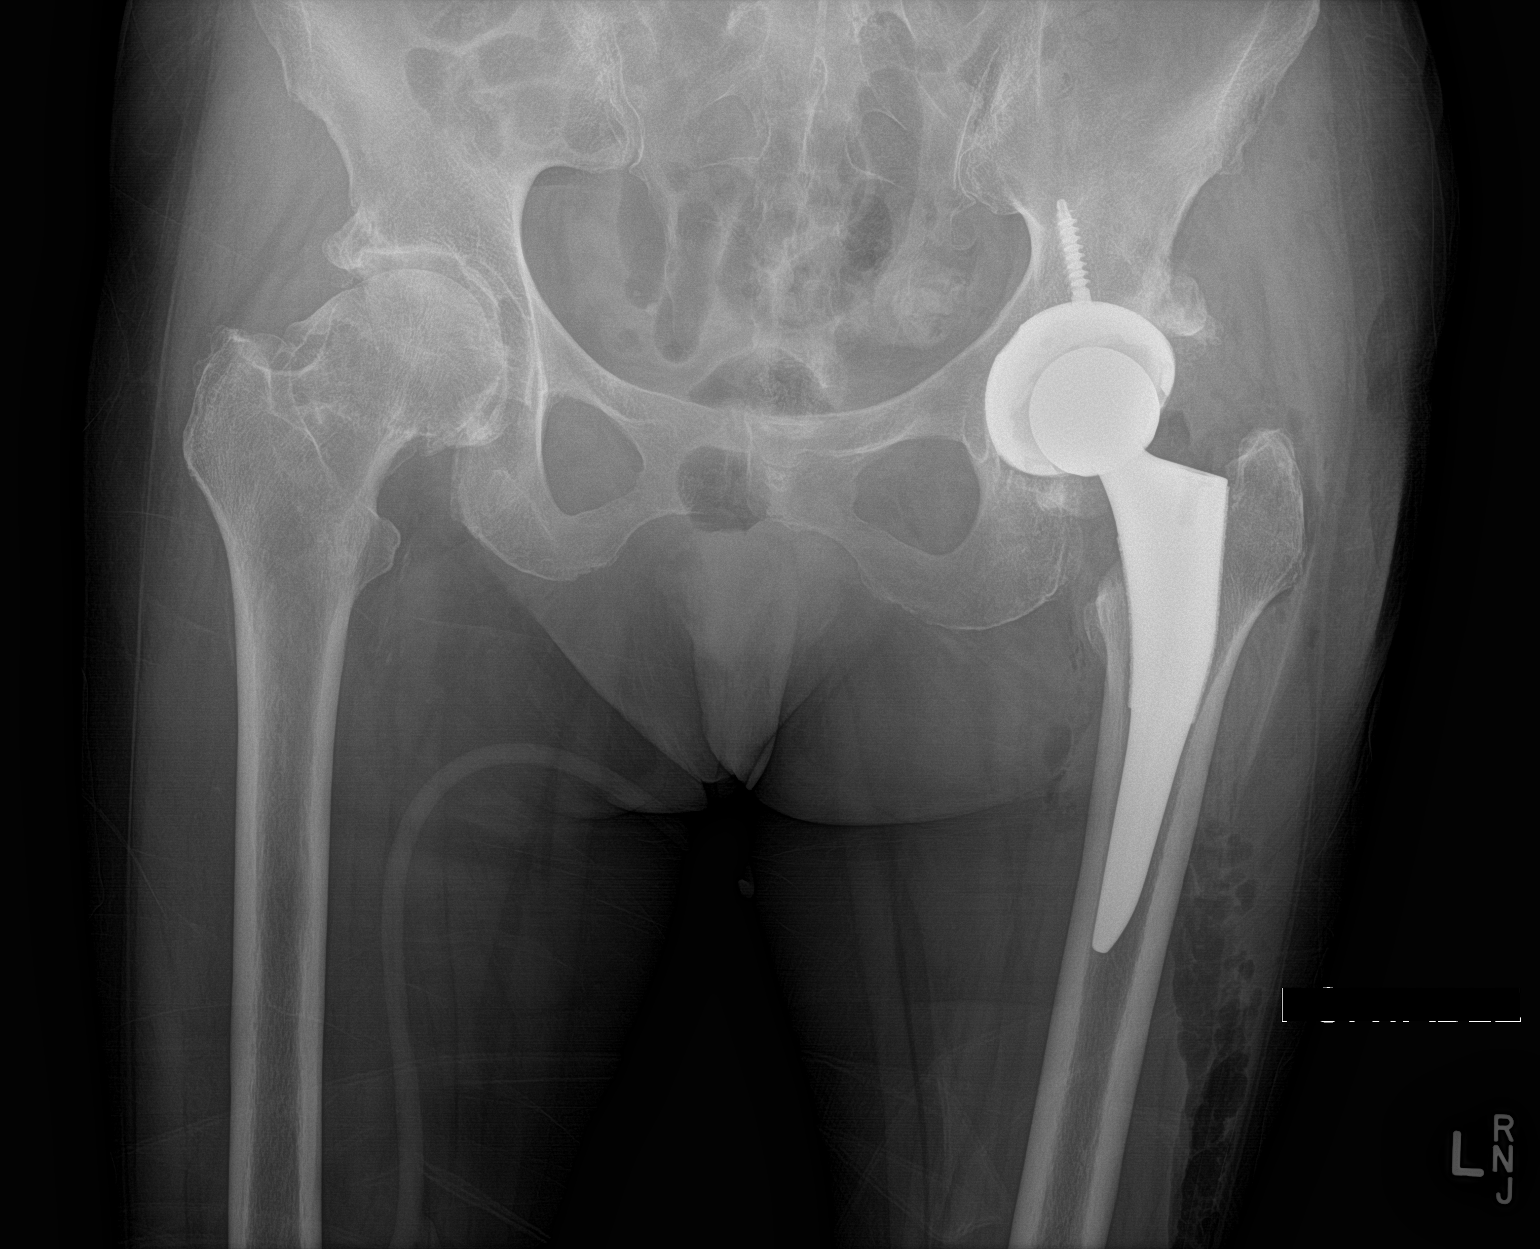

[2 of 2 positions shown; findings below may reference images not displayed]

FINDINGS: The acetabular and femoral components in the left hip appear well
situated. No fracture or dislocation is noted. Expected
postoperative findings are seen in the surrounding soft tissues.
IMPRESSION: Status post left total hip arthroplasty.

## 2016-11-22 DIAGNOSIS — R8299 Other abnormal findings in urine: Secondary | ICD-10-CM | POA: Diagnosis not present

## 2016-11-22 DIAGNOSIS — E784 Other hyperlipidemia: Secondary | ICD-10-CM | POA: Diagnosis not present

## 2016-11-22 DIAGNOSIS — R358 Other polyuria: Secondary | ICD-10-CM | POA: Diagnosis not present

## 2016-11-22 DIAGNOSIS — M810 Age-related osteoporosis without current pathological fracture: Secondary | ICD-10-CM | POA: Diagnosis not present

## 2016-11-22 DIAGNOSIS — I10 Essential (primary) hypertension: Secondary | ICD-10-CM | POA: Diagnosis not present

## 2016-11-29 DIAGNOSIS — Z23 Encounter for immunization: Secondary | ICD-10-CM | POA: Diagnosis not present

## 2016-11-29 DIAGNOSIS — E559 Vitamin D deficiency, unspecified: Secondary | ICD-10-CM | POA: Diagnosis not present

## 2016-11-29 DIAGNOSIS — I10 Essential (primary) hypertension: Secondary | ICD-10-CM | POA: Diagnosis not present

## 2016-11-29 DIAGNOSIS — M810 Age-related osteoporosis without current pathological fracture: Secondary | ICD-10-CM | POA: Diagnosis not present

## 2016-11-29 DIAGNOSIS — Z1389 Encounter for screening for other disorder: Secondary | ICD-10-CM | POA: Diagnosis not present

## 2016-11-29 DIAGNOSIS — Z6824 Body mass index (BMI) 24.0-24.9, adult: Secondary | ICD-10-CM | POA: Diagnosis not present

## 2016-11-29 DIAGNOSIS — R808 Other proteinuria: Secondary | ICD-10-CM | POA: Diagnosis not present

## 2016-11-29 DIAGNOSIS — E784 Other hyperlipidemia: Secondary | ICD-10-CM | POA: Diagnosis not present

## 2016-11-29 DIAGNOSIS — Z0000 Encounter for general adult medical examination without abnormal findings: Secondary | ICD-10-CM | POA: Diagnosis not present

## 2016-11-29 DIAGNOSIS — F0280 Dementia in other diseases classified elsewhere without behavioral disturbance: Secondary | ICD-10-CM | POA: Diagnosis not present

## 2016-11-29 DIAGNOSIS — M5416 Radiculopathy, lumbar region: Secondary | ICD-10-CM | POA: Diagnosis not present

## 2016-11-30 DIAGNOSIS — E784 Other hyperlipidemia: Secondary | ICD-10-CM | POA: Diagnosis not present

## 2016-11-30 DIAGNOSIS — Z0000 Encounter for general adult medical examination without abnormal findings: Secondary | ICD-10-CM | POA: Diagnosis not present

## 2016-11-30 DIAGNOSIS — R808 Other proteinuria: Secondary | ICD-10-CM | POA: Diagnosis not present

## 2016-11-30 DIAGNOSIS — I10 Essential (primary) hypertension: Secondary | ICD-10-CM | POA: Diagnosis not present

## 2016-11-30 DIAGNOSIS — E559 Vitamin D deficiency, unspecified: Secondary | ICD-10-CM | POA: Diagnosis not present

## 2016-11-30 DIAGNOSIS — Z1389 Encounter for screening for other disorder: Secondary | ICD-10-CM | POA: Diagnosis not present

## 2016-11-30 DIAGNOSIS — M5416 Radiculopathy, lumbar region: Secondary | ICD-10-CM | POA: Diagnosis not present

## 2016-11-30 DIAGNOSIS — M810 Age-related osteoporosis without current pathological fracture: Secondary | ICD-10-CM | POA: Diagnosis not present

## 2016-11-30 DIAGNOSIS — Z23 Encounter for immunization: Secondary | ICD-10-CM | POA: Diagnosis not present

## 2016-11-30 DIAGNOSIS — Z6824 Body mass index (BMI) 24.0-24.9, adult: Secondary | ICD-10-CM | POA: Diagnosis not present

## 2016-11-30 DIAGNOSIS — F0280 Dementia in other diseases classified elsewhere without behavioral disturbance: Secondary | ICD-10-CM | POA: Diagnosis not present

## 2017-01-17 DIAGNOSIS — M810 Age-related osteoporosis without current pathological fracture: Secondary | ICD-10-CM | POA: Diagnosis not present

## 2017-01-17 DIAGNOSIS — M85832 Other specified disorders of bone density and structure, left forearm: Secondary | ICD-10-CM | POA: Diagnosis not present

## 2017-01-18 DIAGNOSIS — Z96642 Presence of left artificial hip joint: Secondary | ICD-10-CM | POA: Diagnosis not present

## 2017-01-18 DIAGNOSIS — M7061 Trochanteric bursitis, right hip: Secondary | ICD-10-CM | POA: Diagnosis not present

## 2017-01-18 DIAGNOSIS — M25551 Pain in right hip: Secondary | ICD-10-CM | POA: Diagnosis not present

## 2017-01-18 DIAGNOSIS — M1611 Unilateral primary osteoarthritis, right hip: Secondary | ICD-10-CM | POA: Diagnosis not present

## 2017-01-18 DIAGNOSIS — Z471 Aftercare following joint replacement surgery: Secondary | ICD-10-CM | POA: Diagnosis not present

## 2017-01-20 DIAGNOSIS — M5136 Other intervertebral disc degeneration, lumbar region: Secondary | ICD-10-CM | POA: Diagnosis not present

## 2017-01-20 DIAGNOSIS — M5416 Radiculopathy, lumbar region: Secondary | ICD-10-CM | POA: Diagnosis not present

## 2017-01-20 DIAGNOSIS — M48061 Spinal stenosis, lumbar region without neurogenic claudication: Secondary | ICD-10-CM | POA: Diagnosis not present

## 2017-01-20 DIAGNOSIS — M545 Low back pain: Secondary | ICD-10-CM | POA: Diagnosis not present

## 2017-01-27 DIAGNOSIS — M48061 Spinal stenosis, lumbar region without neurogenic claudication: Secondary | ICD-10-CM | POA: Diagnosis not present

## 2017-02-06 DIAGNOSIS — M5416 Radiculopathy, lumbar region: Secondary | ICD-10-CM | POA: Diagnosis not present

## 2017-02-06 DIAGNOSIS — M5136 Other intervertebral disc degeneration, lumbar region: Secondary | ICD-10-CM | POA: Diagnosis not present

## 2017-02-06 DIAGNOSIS — M4316 Spondylolisthesis, lumbar region: Secondary | ICD-10-CM | POA: Diagnosis not present

## 2017-02-06 DIAGNOSIS — M48061 Spinal stenosis, lumbar region without neurogenic claudication: Secondary | ICD-10-CM | POA: Diagnosis not present

## 2017-02-07 ENCOUNTER — Other Ambulatory Visit: Payer: Self-pay | Admitting: Internal Medicine

## 2017-02-07 DIAGNOSIS — K7689 Other specified diseases of liver: Secondary | ICD-10-CM

## 2017-02-08 DIAGNOSIS — C4441 Basal cell carcinoma of skin of scalp and neck: Secondary | ICD-10-CM | POA: Diagnosis not present

## 2017-02-08 DIAGNOSIS — D485 Neoplasm of uncertain behavior of skin: Secondary | ICD-10-CM | POA: Diagnosis not present

## 2017-02-08 DIAGNOSIS — Z85828 Personal history of other malignant neoplasm of skin: Secondary | ICD-10-CM | POA: Diagnosis not present

## 2017-02-08 DIAGNOSIS — D2262 Melanocytic nevi of left upper limb, including shoulder: Secondary | ICD-10-CM | POA: Diagnosis not present

## 2017-02-08 DIAGNOSIS — L821 Other seborrheic keratosis: Secondary | ICD-10-CM | POA: Diagnosis not present

## 2017-02-08 DIAGNOSIS — L720 Epidermal cyst: Secondary | ICD-10-CM | POA: Diagnosis not present

## 2017-02-08 DIAGNOSIS — D225 Melanocytic nevi of trunk: Secondary | ICD-10-CM | POA: Diagnosis not present

## 2017-02-08 DIAGNOSIS — L570 Actinic keratosis: Secondary | ICD-10-CM | POA: Diagnosis not present

## 2017-02-13 ENCOUNTER — Ambulatory Visit
Admission: RE | Admit: 2017-02-13 | Discharge: 2017-02-13 | Disposition: A | Discharge status: Home / Self Care | Payer: Medicare Other | Source: Ambulatory Visit | Attending: Internal Medicine | Admitting: Internal Medicine

## 2017-02-13 DIAGNOSIS — K7689 Other specified diseases of liver: Secondary | ICD-10-CM

## 2017-02-15 DIAGNOSIS — S1081XA Abrasion of other specified part of neck, initial encounter: Secondary | ICD-10-CM | POA: Diagnosis not present

## 2017-02-15 DIAGNOSIS — Z85828 Personal history of other malignant neoplasm of skin: Secondary | ICD-10-CM | POA: Diagnosis not present

## 2017-02-28 DIAGNOSIS — Z85828 Personal history of other malignant neoplasm of skin: Secondary | ICD-10-CM | POA: Diagnosis not present

## 2017-02-28 DIAGNOSIS — C4441 Basal cell carcinoma of skin of scalp and neck: Secondary | ICD-10-CM | POA: Diagnosis not present

## 2017-03-20 DIAGNOSIS — M5136 Other intervertebral disc degeneration, lumbar region: Secondary | ICD-10-CM | POA: Diagnosis not present

## 2017-03-20 DIAGNOSIS — M4316 Spondylolisthesis, lumbar region: Secondary | ICD-10-CM | POA: Diagnosis not present

## 2017-03-20 DIAGNOSIS — M545 Low back pain: Secondary | ICD-10-CM | POA: Diagnosis not present

## 2017-03-28 DIAGNOSIS — R8290 Unspecified abnormal findings in urine: Secondary | ICD-10-CM | POA: Diagnosis not present

## 2017-03-28 DIAGNOSIS — N390 Urinary tract infection, site not specified: Secondary | ICD-10-CM | POA: Diagnosis not present

## 2017-03-28 DIAGNOSIS — R6889 Other general symptoms and signs: Secondary | ICD-10-CM | POA: Diagnosis not present

## 2017-05-29 DIAGNOSIS — H21301 Idiopathic cysts of iris, ciliary body or anterior chamber, right eye: Secondary | ICD-10-CM | POA: Diagnosis not present

## 2017-05-29 DIAGNOSIS — Z961 Presence of intraocular lens: Secondary | ICD-10-CM | POA: Diagnosis not present

## 2017-05-29 DIAGNOSIS — H2512 Age-related nuclear cataract, left eye: Secondary | ICD-10-CM | POA: Diagnosis not present

## 2017-05-31 DIAGNOSIS — K7689 Other specified diseases of liver: Secondary | ICD-10-CM | POA: Diagnosis not present

## 2017-05-31 DIAGNOSIS — I10 Essential (primary) hypertension: Secondary | ICD-10-CM | POA: Diagnosis not present

## 2017-05-31 DIAGNOSIS — F0280 Dementia in other diseases classified elsewhere without behavioral disturbance: Secondary | ICD-10-CM | POA: Diagnosis not present

## 2017-05-31 DIAGNOSIS — Z23 Encounter for immunization: Secondary | ICD-10-CM | POA: Diagnosis not present

## 2017-05-31 DIAGNOSIS — E7849 Other hyperlipidemia: Secondary | ICD-10-CM | POA: Diagnosis not present

## 2017-05-31 DIAGNOSIS — Z6825 Body mass index (BMI) 25.0-25.9, adult: Secondary | ICD-10-CM | POA: Diagnosis not present

## 2017-06-26 DIAGNOSIS — M25561 Pain in right knee: Secondary | ICD-10-CM | POA: Insufficient documentation

## 2017-07-10 DIAGNOSIS — M1611 Unilateral primary osteoarthritis, right hip: Secondary | ICD-10-CM | POA: Diagnosis not present

## 2017-07-10 DIAGNOSIS — M25551 Pain in right hip: Secondary | ICD-10-CM | POA: Diagnosis not present

## 2017-07-12 DIAGNOSIS — M545 Low back pain: Secondary | ICD-10-CM | POA: Diagnosis not present

## 2017-07-12 DIAGNOSIS — M1611 Unilateral primary osteoarthritis, right hip: Secondary | ICD-10-CM | POA: Diagnosis not present

## 2017-07-19 DIAGNOSIS — M25551 Pain in right hip: Secondary | ICD-10-CM | POA: Diagnosis not present

## 2017-08-10 DIAGNOSIS — Z961 Presence of intraocular lens: Secondary | ICD-10-CM | POA: Diagnosis not present

## 2017-08-10 DIAGNOSIS — H2512 Age-related nuclear cataract, left eye: Secondary | ICD-10-CM | POA: Diagnosis not present

## 2017-08-16 DIAGNOSIS — M545 Low back pain, unspecified: Secondary | ICD-10-CM | POA: Insufficient documentation

## 2017-08-16 DIAGNOSIS — M5136 Other intervertebral disc degeneration, lumbar region: Secondary | ICD-10-CM | POA: Diagnosis not present

## 2017-08-16 DIAGNOSIS — M25551 Pain in right hip: Secondary | ICD-10-CM | POA: Diagnosis not present

## 2017-08-16 DIAGNOSIS — M51369 Other intervertebral disc degeneration, lumbar region without mention of lumbar back pain or lower extremity pain: Secondary | ICD-10-CM | POA: Insufficient documentation

## 2017-10-04 DIAGNOSIS — N390 Urinary tract infection, site not specified: Secondary | ICD-10-CM | POA: Diagnosis not present

## 2017-10-04 DIAGNOSIS — M545 Low back pain: Secondary | ICD-10-CM | POA: Diagnosis not present

## 2017-10-19 DIAGNOSIS — Z6824 Body mass index (BMI) 24.0-24.9, adult: Secondary | ICD-10-CM | POA: Diagnosis not present

## 2017-10-19 DIAGNOSIS — R300 Dysuria: Secondary | ICD-10-CM | POA: Diagnosis not present

## 2017-10-19 DIAGNOSIS — N760 Acute vaginitis: Secondary | ICD-10-CM | POA: Diagnosis not present

## 2017-10-20 DIAGNOSIS — M25561 Pain in right knee: Secondary | ICD-10-CM | POA: Diagnosis not present

## 2017-12-05 DIAGNOSIS — L298 Other pruritus: Secondary | ICD-10-CM | POA: Diagnosis not present

## 2017-12-05 DIAGNOSIS — F0280 Dementia in other diseases classified elsewhere without behavioral disturbance: Secondary | ICD-10-CM | POA: Diagnosis not present

## 2017-12-05 DIAGNOSIS — L299 Pruritus, unspecified: Secondary | ICD-10-CM | POA: Diagnosis not present

## 2017-12-05 DIAGNOSIS — B372 Candidiasis of skin and nail: Secondary | ICD-10-CM | POA: Diagnosis not present

## 2017-12-05 DIAGNOSIS — I10 Essential (primary) hypertension: Secondary | ICD-10-CM | POA: Diagnosis not present

## 2017-12-05 DIAGNOSIS — R809 Proteinuria, unspecified: Secondary | ICD-10-CM | POA: Diagnosis not present

## 2017-12-05 DIAGNOSIS — Z6823 Body mass index (BMI) 23.0-23.9, adult: Secondary | ICD-10-CM | POA: Diagnosis not present

## 2017-12-10 DIAGNOSIS — B372 Candidiasis of skin and nail: Secondary | ICD-10-CM | POA: Diagnosis not present

## 2017-12-10 DIAGNOSIS — L298 Other pruritus: Secondary | ICD-10-CM | POA: Diagnosis not present

## 2017-12-10 DIAGNOSIS — I10 Essential (primary) hypertension: Secondary | ICD-10-CM | POA: Diagnosis not present

## 2017-12-10 DIAGNOSIS — R809 Proteinuria, unspecified: Secondary | ICD-10-CM | POA: Diagnosis not present

## 2017-12-10 DIAGNOSIS — L299 Pruritus, unspecified: Secondary | ICD-10-CM | POA: Diagnosis not present

## 2017-12-10 DIAGNOSIS — F0280 Dementia in other diseases classified elsewhere without behavioral disturbance: Secondary | ICD-10-CM | POA: Diagnosis not present

## 2017-12-10 DIAGNOSIS — Z6823 Body mass index (BMI) 23.0-23.9, adult: Secondary | ICD-10-CM | POA: Diagnosis not present

## 2017-12-21 DIAGNOSIS — Z4689 Encounter for fitting and adjustment of other specified devices: Secondary | ICD-10-CM | POA: Diagnosis not present

## 2017-12-21 DIAGNOSIS — M419 Scoliosis, unspecified: Secondary | ICD-10-CM | POA: Diagnosis not present

## 2017-12-21 DIAGNOSIS — M549 Dorsalgia, unspecified: Secondary | ICD-10-CM | POA: Diagnosis not present

## 2018-01-03 DIAGNOSIS — M25561 Pain in right knee: Secondary | ICD-10-CM | POA: Diagnosis not present

## 2018-01-03 DIAGNOSIS — M25551 Pain in right hip: Secondary | ICD-10-CM | POA: Diagnosis not present

## 2018-01-03 DIAGNOSIS — M1611 Unilateral primary osteoarthritis, right hip: Secondary | ICD-10-CM | POA: Diagnosis not present

## 2018-01-11 DIAGNOSIS — M47816 Spondylosis without myelopathy or radiculopathy, lumbar region: Secondary | ICD-10-CM | POA: Diagnosis not present

## 2018-01-11 DIAGNOSIS — Z9181 History of falling: Secondary | ICD-10-CM | POA: Diagnosis not present

## 2018-01-11 DIAGNOSIS — M419 Scoliosis, unspecified: Secondary | ICD-10-CM | POA: Diagnosis not present

## 2018-01-12 DIAGNOSIS — L821 Other seborrheic keratosis: Secondary | ICD-10-CM | POA: Diagnosis not present

## 2018-01-12 DIAGNOSIS — L308 Other specified dermatitis: Secondary | ICD-10-CM | POA: Diagnosis not present

## 2018-01-12 DIAGNOSIS — Z85828 Personal history of other malignant neoplasm of skin: Secondary | ICD-10-CM | POA: Diagnosis not present

## 2018-01-18 DIAGNOSIS — M1611 Unilateral primary osteoarthritis, right hip: Secondary | ICD-10-CM | POA: Diagnosis not present

## 2018-01-18 DIAGNOSIS — M25551 Pain in right hip: Secondary | ICD-10-CM | POA: Diagnosis not present

## 2018-01-22 DIAGNOSIS — I10 Essential (primary) hypertension: Secondary | ICD-10-CM | POA: Diagnosis not present

## 2018-01-22 DIAGNOSIS — Z6825 Body mass index (BMI) 25.0-25.9, adult: Secondary | ICD-10-CM | POA: Diagnosis not present

## 2018-01-22 DIAGNOSIS — M545 Low back pain: Secondary | ICD-10-CM | POA: Diagnosis not present

## 2018-01-22 DIAGNOSIS — M419 Scoliosis, unspecified: Secondary | ICD-10-CM | POA: Diagnosis not present

## 2018-01-30 DIAGNOSIS — M47816 Spondylosis without myelopathy or radiculopathy, lumbar region: Secondary | ICD-10-CM | POA: Diagnosis not present

## 2018-02-02 DIAGNOSIS — L814 Other melanin hyperpigmentation: Secondary | ICD-10-CM | POA: Diagnosis not present

## 2018-02-02 DIAGNOSIS — L570 Actinic keratosis: Secondary | ICD-10-CM | POA: Diagnosis not present

## 2018-02-02 DIAGNOSIS — D2271 Melanocytic nevi of right lower limb, including hip: Secondary | ICD-10-CM | POA: Diagnosis not present

## 2018-02-02 DIAGNOSIS — Z85828 Personal history of other malignant neoplasm of skin: Secondary | ICD-10-CM | POA: Diagnosis not present

## 2018-02-02 DIAGNOSIS — D1801 Hemangioma of skin and subcutaneous tissue: Secondary | ICD-10-CM | POA: Diagnosis not present

## 2018-02-02 DIAGNOSIS — L821 Other seborrheic keratosis: Secondary | ICD-10-CM | POA: Diagnosis not present

## 2018-02-05 DIAGNOSIS — Z23 Encounter for immunization: Secondary | ICD-10-CM | POA: Diagnosis not present

## 2018-02-05 DIAGNOSIS — R350 Frequency of micturition: Secondary | ICD-10-CM | POA: Diagnosis not present

## 2018-02-07 DIAGNOSIS — M47816 Spondylosis without myelopathy or radiculopathy, lumbar region: Secondary | ICD-10-CM | POA: Diagnosis not present

## 2018-02-08 NOTE — Progress Notes (Signed)
HPI: FU hypertension and hyperlipidemia. An echocardiogram performed in November of 2008 showed normal LV function. There was trivial aortic insufficiency. Since I last saw her, she has very poor short-term memory.  However she denies dyspnea, chest pain, palpitations or syncope.  Current Outpatient Medications  Medication Sig Dispense Refill  . acetaminophen (TYLENOL 8 HOUR) 650 MG CR tablet Take 650 mg by mouth every 8 (eight) hours as needed for pain.    Marland Kitchen AMOXICILLIN PO Take by mouth.    . Calcium Carbonate-Vit D-Min (CALCIUM 1200 PO) Take 1 tablet by mouth daily.     . cetirizine (ZYRTEC) 10 MG tablet Take 1 tablet (10 mg total) by mouth daily. (Patient taking differently: Take 10 mg by mouth daily as needed for allergies. ) 30 tablet 11  . Cholecalciferol (VITAMIN D3) 2000 units TABS Take 2,000 Units by mouth daily.    Marland Kitchen docusate sodium (COLACE) 100 MG capsule Take 1 capsule (100 mg total) by mouth 2 (two) times daily. 10 capsule 0  . escitalopram (LEXAPRO) 5 MG tablet TAKE 1 TABLET BY MOUTH DAILY*CALL OFFICE TO MAKE APPOINTMENT* (Patient taking differently: TAKE 1 TABLET BY MOUTH DAILY) 30 tablet 0  . gabapentin (NEURONTIN) 100 MG capsule Take 100 mg by mouth 2 (two) times daily.    Marland Kitchen galantamine (RAZADYNE ER) 24 MG 24 hr capsule TAKE 1 CAPSULE BY MOUTH EVERY DAY 90 capsule 1  . Multiple Vitamin (MULTIVITAMIN WITH MINERALS) TABS tablet Take 1 tablet by mouth daily.    Marland Kitchen NAMENDA XR 28 MG CP24 24 hr capsule TAKE 1 CAPSULE(28 MG) BY MOUTH DAILY 30 capsule 5  . olmesartan (BENICAR) 40 MG tablet Take 40 mg by mouth daily.     No current facility-administered medications for this visit.      Past Medical History:  Diagnosis Date  . Arthritis   . Disturbances of sensation of smell and taste   . Essential hypertension    a.  02/2007 Echo: Nl EF, triv AI.  Marland Kitchen GERD (gastroesophageal reflux disease)   . Lichen sclerosus   . Lipoma of face   . Memory loss   . Mixed hyperlipidemia     . Osteopenia   . Other persistent mental disorders due to conditions classified elsewhere     Past Surgical History:  Procedure Laterality Date  . CATARACT EXTRACTION, BILATERAL    . OOPHORECTOMY  1988   DIAG LAP W BSO  . TONSILECTOMY, ADENOIDECTOMY, BILATERAL MYRINGOTOMY AND TUBES    . TOTAL HIP ARTHROPLASTY Left 09/01/2015   Procedure: LEFT TOTAL HIP ARTHROPLASTY ANTERIOR APPROACH;  Surgeon: Paralee Cancel, MD;  Location: [facility];  Service: Orthopedics;  Laterality: Left;    Social History   Socioeconomic History  . Marital status: Married    Spouse name: Not on file  . Number of children: 3  . Years of education: Not on file  . Highest education level: Not on file  Occupational History  . Occupation: retired travel Music therapist    Comment: full time  Social Needs  . Financial resource strain: Not on file  . Food insecurity:    Worry: Not on file    Inability: Not on file  . Transportation needs:    Medical: Not on file    Non-medical: Not on file  Tobacco Use  . Smoking status: Former Smoker    Last attempt to quit: 09/13/2002    Years since quitting: 15.4  . Smokeless tobacco: Never Used  Substance and  Sexual Activity  . Alcohol use: Yes    Alcohol/week: 21.0 standard drinks    Types: 14 Glasses of wine, 7 Standard drinks or equivalent per week    Comment: 2 glasses of wine each evening  . Drug use: No  . Sexual activity: Never  Lifestyle  . Physical activity:    Days per week: Not on file    Minutes per session: Not on file  . Stress: Not on file  Relationships  . Social connections:    Talks on phone: Not on file    Gets together: Not on file    Attends religious service: Not on file    Active member of club or organization: Not on file    Attends meetings of clubs or organizations: Not on file    Relationship status: Not on file  . Intimate partner violence:    Fear of current or ex partner: Not on file    Emotionally abused: Not on file    Physically abused:  Not on file    Forced sexual activity: Not on file  Other Topics Concern  . Not on file  Social History Narrative   This patient is widowed. She lives alone  in a single level  home in [place], [place] from [place] to assist with meals and transportation.    Patient is retired Garment/textile technologist.    Main exercise is walking, she has a Programmer, systems.   Stopped smoking 20 years ago, drinks 1-2 glasses of wine a  day   Patient has 2 sons and one daughter, all live out of town.   Has a Living Will, DO NOT RESUSCITATE.    Family History  Problem Relation Age of Onset  . Cancer Mother        OVARIAN  . Diabetes Father   . Hypertension Father     ROS: no fevers or chills, productive cough, hemoptysis, dysphasia, odynophagia, melena, hematochezia, dysuria, hematuria, rash, seizure activity, orthopnea, PND, pedal edema, claudication. Remaining systems are negative.  Physical Exam: Well-developed well-nourished in no acute distress.  Skin is warm and dry.  HEENT is normal.  Neck is supple.  Chest is clear to auscultation with normal expansion.  Cardiovascular exam is regular rate and rhythm.  Abdominal exam nontender or distended. No masses palpated. Extremities show no edema. neuro grossly intact; significant dementia  ECG-sinus rhythm at a rate of 77.  Nonspecific ST changes.  Personally reviewed  A/P  1 hypertension-blood pressure is controlled.  Continue present medications.  Renal function monitored by primary care.  2 hyperlipidemia-Lipids and liver monitored by primary care.  3 significant dementia-Per primary care.  Kirk Ruths, MD

## 2018-02-19 ENCOUNTER — Ambulatory Visit (INDEPENDENT_AMBULATORY_CARE_PROVIDER_SITE_OTHER): Payer: Medicare Other | Admitting: Cardiology

## 2018-02-19 ENCOUNTER — Encounter: Payer: Self-pay | Admitting: Cardiology

## 2018-02-19 VITALS — BP 112/60 | HR 77 | Ht 59.0 in | Wt 120.0 lb

## 2018-02-19 DIAGNOSIS — I10 Essential (primary) hypertension: Secondary | ICD-10-CM | POA: Diagnosis not present

## 2018-02-19 DIAGNOSIS — E7800 Pure hypercholesterolemia, unspecified: Secondary | ICD-10-CM | POA: Diagnosis not present

## 2018-02-19 NOTE — Addendum Note (Signed)
Addended by: Venetia Maxon on: 02/19/2018 04:55 PM   Modules accepted: Orders

## 2018-02-19 NOTE — Patient Instructions (Signed)

## 2018-03-12 DIAGNOSIS — M47816 Spondylosis without myelopathy or radiculopathy, lumbar region: Secondary | ICD-10-CM | POA: Diagnosis not present

## 2018-03-28 DIAGNOSIS — R350 Frequency of micturition: Secondary | ICD-10-CM | POA: Diagnosis not present

## 2018-03-28 DIAGNOSIS — N390 Urinary tract infection, site not specified: Secondary | ICD-10-CM | POA: Diagnosis not present

## 2018-03-30 DIAGNOSIS — M1611 Unilateral primary osteoarthritis, right hip: Secondary | ICD-10-CM | POA: Diagnosis not present

## 2018-03-30 DIAGNOSIS — M25551 Pain in right hip: Secondary | ICD-10-CM | POA: Diagnosis not present

## 2018-04-04 DIAGNOSIS — M47816 Spondylosis without myelopathy or radiculopathy, lumbar region: Secondary | ICD-10-CM | POA: Diagnosis not present

## 2018-04-24 DIAGNOSIS — M47816 Spondylosis without myelopathy or radiculopathy, lumbar region: Secondary | ICD-10-CM | POA: Diagnosis not present

## 2018-04-30 DIAGNOSIS — M47816 Spondylosis without myelopathy or radiculopathy, lumbar region: Secondary | ICD-10-CM | POA: Diagnosis not present

## 2018-05-07 ENCOUNTER — Telehealth: Payer: Self-pay | Admitting: *Deleted

## 2018-05-07 NOTE — Telephone Encounter (Signed)
   [facility] Pre-operative Risk Assessment    Request for surgical clearance:  1. What type of surgery is being performed? RIGHT TOTAL HIP   2. When is this surgery scheduled? 06/12/18   3. What type of clearance is required (medical clearance vs. Pharmacy clearance to hold med vs. Both)? MEDICAL   4. Are there any medications that need to be held prior to surgery and how long?    5. Practice name and name of physician performing surgery? [facility]    6. What is your office phone number? [phone]      7.   What is your office fax number? [place]   Anesthesia type (None, local, MAC, general) ? SPINAL

## 2018-05-08 ENCOUNTER — Encounter: Payer: Self-pay | Admitting: Gastroenterology

## 2018-05-08 ENCOUNTER — Ambulatory Visit (INDEPENDENT_AMBULATORY_CARE_PROVIDER_SITE_OTHER): Payer: Medicare Other | Admitting: Gastroenterology

## 2018-05-08 VITALS — BP 100/70 | HR 60 | Ht 59.0 in | Wt 120.4 lb

## 2018-05-08 DIAGNOSIS — K648 Other hemorrhoids: Secondary | ICD-10-CM

## 2018-05-08 DIAGNOSIS — K5900 Constipation, unspecified: Secondary | ICD-10-CM

## 2018-05-08 NOTE — Patient Instructions (Signed)
If you are age 82 or older, your body mass index should be between 23-30. Your Body mass index is 24.32 kg/m. If this is out of the aforementioned range listed, please consider follow up with your Primary Care Provider.  If you are age 68 or younger, your body mass index should be between 19-25. Your Body mass index is 24.32 kg/m. If this is out of the aformentioned range listed, please consider follow up with your Primary Care Provider.   Continue Miralax   Purchase Preparation H suppositories over the counter. Use daily as needed.

## 2018-05-08 NOTE — Progress Notes (Addendum)
    History of Present Illness: This is an 84 year old female with rectal pain, rectal swelling, constipation for several months.  She is accompanied by her long-term caregiver who provides almost all of the history.  She relates the patient has had intermittent rectal pain, ongoing constipation and the caregiver has noted mild, intermittent swelling in the anal area.  The caregiver also recently noted peri-rectal redness. She was evaluated by Dr. Lang Snow yesterday and I reviewed the records.  She has been treated for hemorrhoids with 2.5% hydrocortisone rectal cream.  Constipation has been managed with Colace and MiraLAX which has been effective.    Colonoscopy 04/2015: 1. Pedunculated polyp in the sigmoid colon; polypectomy performed using snare cautery (tubular adenoma) 2. Moderate diverticulosis in the sigmoid colon and descending colon 3. Grade l internal hemorrhoids  Current Medications, Allergies, Past Medical History, Past Surgical History, Family History and Social History were reviewed in [facility] record.  Physical Exam: General: Well developed, well nourished, no acute distress Head: Normocephalic and atraumatic Eyes:  sclerae anicteric, EOMI Ears: Normal auditory acuity Mouth: No deformity or lesions Lungs: Clear throughout to auscultation Heart: Regular rate and rhythm; no murmurs, rubs or bruits Abdomen: Soft, non tender and non distended. No masses, hepatosplenomegaly or hernias noted. Normal Bowel sounds Rectal: Mild perianal erythema, small internal hemorrhoids visible in the anal canal, no tenderness, no lesions, Hemoccult negative firm brown stool in the vault Musculoskeletal: Symmetrical with no gross deformities  Pulses:  Normal pulses noted Extremities: No clubbing, cyanosis, edema or deformities noted Neurological: Alert oriented x 4, grossly nonfocal Psychological:  Alert and cooperative. Normal mood and affect   Assessment and  Recommendations:  1.  Constipation.  Maintain high-fiber diet with adequate daily water intake.  Continue Colace and MiraLAX.  2.  Internal hemorrhoids, intermittently symptomatic.  Mild perianal fungal infection. Complete 7 days of Diflucan and 7 days of nystantin tid as recommended by Dr. Brigitte Pulse. Gold Bond powder to control moisture as recommended by Dr. Brigitte Pulse. 2.5% hydrocortisone cream to anal canal daily as needed only when hemorrhoid symptoms are active.  Preparation H suppository daily as needed only when hemorrhoid symptoms are active.  Internal hemorrhoids were noted on prior colonoscopy.  Reassurance provided.  Follow up with Lang Snow, MD for ongoing care. GI follow up prn.   3. Personal history of adenomatous colon polyps. Given patietns age and comorbidities we have no plans for future surveillance colonoscopies.

## 2018-05-08 NOTE — Telephone Encounter (Signed)
[facility] [place] for surgery; not a candidate for aggressive cardiac evaluation Kirk Ruths

## 2018-05-08 NOTE — Telephone Encounter (Signed)
   Primary Cardiologist: Kirk Ruths, MD  Chart reviewed as part of pre-operative protocol coverage. Spoke with Patient's care giver (provider care 24/7). Patient denies any cardiac symptoms but limited in activity due to dementia and Alzhimer's disease. Last echo in 2008 was normal. Dr. Stanford Breed, does patient requires any testing or cleared at acceptable risk?   [place], [place] 05/08/2018, 4:47 PM

## 2018-05-15 DIAGNOSIS — R5383 Other fatigue: Secondary | ICD-10-CM | POA: Diagnosis not present

## 2018-05-15 DIAGNOSIS — I10 Essential (primary) hypertension: Secondary | ICD-10-CM | POA: Diagnosis not present

## 2018-05-15 DIAGNOSIS — F0280 Dementia in other diseases classified elsewhere without behavioral disturbance: Secondary | ICD-10-CM | POA: Diagnosis not present

## 2018-05-15 DIAGNOSIS — M25551 Pain in right hip: Secondary | ICD-10-CM | POA: Diagnosis not present

## 2018-05-15 DIAGNOSIS — E785 Hyperlipidemia, unspecified: Secondary | ICD-10-CM | POA: Diagnosis not present

## 2018-05-15 DIAGNOSIS — Z6823 Body mass index (BMI) 23.0-23.9, adult: Secondary | ICD-10-CM | POA: Diagnosis not present

## 2018-05-27 NOTE — H&P (Signed)
**Note Margaret-Identified via Obfuscation** TOTAL HIP ADMISSION H&P  Patient is admitted for right total hip arthroplasty, anterior approach.  Subjective:  Chief Complaint:    Right hip primary OA / pain  HPI: Margaret [name], 84 y.o. female, has a history of pain and functional disability in the right hip(s) due to arthritis and patient has failed non-surgical conservative treatments for greater than 12 weeks to include NSAID's and/or analgesics, corticosteriod injections and activity modification.  Onset of symptoms was gradual starting 2.5+ years ago with gradually worsening course since that time.The patient noted no past surgery on the right hip(s).  Patient currently rates pain in the right hip at 8 out of 10 with activity. Patient has worsening of pain with activity and weight bearing, trendelenberg gait, pain that interfers with activities of daily living, pain with passive range of motion, crepitus and joint swelling. Patient has evidence of periarticular osteophytes and joint space narrowing by imaging studies. This condition presents safety issues increasing the risk of falls.  There is no current active infection.  Risks, benefits and expectations were discussed with the patient.  Risks including but not limited to the risk of anesthesia, blood clots, nerve damage, blood vessel damage, failure of the prosthesis, infection and up to and including death.  Patient understand the risks, benefits and expectations and wishes to proceed with surgery.   PCP: Marton Redwood, MD  D/C Plans:       Home   Post-op Meds:       No Rx given  Tranexamic Acid:      To be given - IV   Decadron:      Is to be given  FYI:      ASA  [facility]  HH[facility]  DME:   Pt already has equipment  PT:   HHPT   Patient Active Problem List   Diagnosis Date Noted  . S/P left THA, AA 09/01/2015  . Mixed hyperlipidemia   . RUQ abdominal pain 02/23/2015  . Nausea without vomiting 02/23/2015  . Elevated LFTs 02/23/2015  . Loss of appetite 02/23/2015  .  Dementia with behavioral disturbance ([place]) 02/04/2015  . Abdominal pain 02/04/2015  . Leaking of urine 02/04/2015  . Dyspepsia 07/17/2013  . Mild cognitive impairment 09/21/2012  . Poison ivy dermatitis 08/27/2012  . Osteopenia   . Lichen sclerosus   . ARM PAIN 06/30/2010  . Hyperlipidemia 07/19/2008  . Essential hypertension 07/19/2008  . BRUIT 07/19/2008   Past Medical History:  Diagnosis Date  . Arthritis   . Disturbances of sensation of smell and taste   . Essential hypertension    a.  02/2007 Echo: Nl EF, triv AI.  Marland Kitchen GERD (gastroesophageal reflux disease)   . Lichen sclerosus   . Lipoma of face   . Memory loss   . Mixed hyperlipidemia   . Osteopenia   . Other persistent mental disorders due to conditions classified elsewhere   . Tubular adenoma of colon 2017    Past Surgical History:  Procedure Laterality Date  . CATARACT EXTRACTION, BILATERAL    . OOPHORECTOMY  1988   DIAG LAP W BSO  . TONSILECTOMY, ADENOIDECTOMY, BILATERAL MYRINGOTOMY AND TUBES    . TOTAL HIP ARTHROPLASTY Left 09/01/2015   Procedure: LEFT TOTAL HIP ARTHROPLASTY ANTERIOR APPROACH;  Surgeon: Paralee Cancel, MD;  Location: [facility];  Service: Orthopedics;  Laterality: Left;    No current facility-administered medications for this encounter.    Current Outpatient Medications  Medication Sig Dispense Refill Last Dose  . acetaminophen (  TYLENOL 8 HOUR) 650 MG CR tablet Take 650 mg by mouth every 8 (eight) hours as needed for pain.   Taking  . AMOXICILLIN PO Take by mouth as directed.    Taking  . Calcium Carbonate-Vit D-Min (CALCIUM 1200 PO) Take 1 tablet by mouth daily.    Taking  . cetirizine (ZYRTEC) 10 MG tablet Take 1 tablet (10 mg total) by mouth daily. (Patient taking differently: Take 10 mg by mouth daily as needed for allergies. ) 30 tablet 11 Taking  . docusate sodium (COLACE) 100 MG capsule Take 1 capsule (100 mg total) by mouth 2 (two) times daily. 10 capsule 0 Taking  . escitalopram (LEXAPRO) 5 MG  tablet TAKE 1 TABLET BY MOUTH DAILY*CALL OFFICE TO MAKE APPOINTMENT* (Patient taking differently: TAKE 1 TABLET BY MOUTH DAILY) 30 tablet 0 Taking  . fluconazole (DIFLUCAN) 100 MG tablet fluconazole 100 mg tablet   Taking  . gabapentin (NEURONTIN) 100 MG capsule Take 100 mg by mouth 2 (two) times daily.   Taking  . galantamine (RAZADYNE ER) 24 MG 24 hr capsule TAKE 1 CAPSULE BY MOUTH EVERY DAY 90 capsule 1 Taking  . hydrocortisone (PROCTOZONE-HC) 2.5 % rectal cream Proctozone-HC 2.5 % topical cream perineal applicator   Taking  . Multiple Vitamin (MULTIVITAMIN WITH MINERALS) TABS tablet Take 1 tablet by mouth daily.   Taking  . NAMENDA XR 28 MG CP24 24 hr capsule TAKE 1 CAPSULE(28 MG) BY MOUTH DAILY 30 capsule 5 Taking  . olmesartan (BENICAR) 40 MG tablet Take 40 mg by mouth daily.   Taking   Allergies  Allergen Reactions  . Sulfa Antibiotics Other (See Comments)    Reaction unknown    Social History   Tobacco Use  . Smoking status: Former Smoker    Last attempt to quit: 09/13/2002    Years since quitting: 15.7  . Smokeless tobacco: Never Used  Substance Use Topics  . Alcohol use: Yes    Alcohol/week: 21.0 standard drinks    Types: 14 Glasses of wine, 7 Standard drinks or equivalent per week    Comment: 2 glasses of wine each evening    Family History  Problem Relation Age of Onset  . Cancer Mother        OVARIAN  . Diabetes Father   . Hypertension Father      Review of Systems  Constitutional: Negative.   HENT: Negative.   Eyes: Negative.   Respiratory: Negative.   Cardiovascular: Negative.   Gastrointestinal: Negative.   Genitourinary: Negative.   Musculoskeletal: Positive for joint pain.  Skin: Negative.   Neurological: Negative.   Endo/Heme/Allergies: Negative.   Psychiatric/Behavioral: Positive for memory loss.    Objective:  Physical Exam  Constitutional: She is oriented to person, place, and time. She appears well-developed.  HENT:  Head: Normocephalic.   Eyes: Pupils are equal, round, and reactive to light.  Neck: Neck supple. No JVD present. No tracheal deviation present. No thyromegaly present.  Cardiovascular: Normal rate, regular rhythm and intact distal pulses.  Respiratory: Effort normal and breath sounds normal. No respiratory distress. She has no wheezes.  GI: Soft. There is no abdominal tenderness. There is no guarding.  Musculoskeletal:     Right hip: She exhibits decreased range of motion, decreased strength, tenderness and bony tenderness. She exhibits no swelling, no deformity and no laceration.  Lymphadenopathy:    She has no cervical adenopathy.  Neurological: She is alert and oriented to person, place, and time.  Skin: Skin is warm  and dry.  Psychiatric: She has a normal mood and affect.     Labs:  Estimated body mass index is 24.32 kg/m as calculated from the following:   Height as of 05/08/18: 4\' 11"  (1.499 m).   Weight as of 05/08/18: 54.6 kg.   Imaging Review Plain radiographs demonstrate severe degenerative joint disease of the right hip. The bone quality appears to be good for age and reported activity level.    Preoperative templating of the joint replacement has been completed, documented, and submitted to the Operating Room personnel in order to optimize intra-operative equipment management.     Assessment/Plan:  End stage arthritis, right hip  The patient history, physical examination, clinical judgement of the provider and imaging studies are consistent with end stage degenerative joint disease of the right hip and total hip arthroplasty is deemed medically necessary. The treatment options including medical management, injection therapy, arthroscopy and arthroplasty were discussed at length. The risks and benefits of total hip arthroplasty were presented and reviewed. The risks due to aseptic loosening, infection, stiffness, dislocation/subluxation,  thromboembolic complications and other imponderables  were discussed.  The patient acknowledged the explanation, agreed to proceed with the plan and consent was signed. Patient is being admitted for inpatient treatment for surgery, pain control, PT, OT, prophylactic antibiotics, VTE prophylaxis, progressive ambulation and ADL's and discharge planning.The patient is planning to be discharged home.     West Pugh Halen [place]   PA-C  05/27/2018, 10:19 AM

## 2018-05-29 DIAGNOSIS — M7918 Myalgia, other site: Secondary | ICD-10-CM | POA: Diagnosis not present

## 2018-05-29 DIAGNOSIS — M419 Scoliosis, unspecified: Secondary | ICD-10-CM | POA: Diagnosis not present

## 2018-05-29 DIAGNOSIS — M545 Low back pain: Secondary | ICD-10-CM | POA: Diagnosis not present

## 2018-05-29 DIAGNOSIS — M47816 Spondylosis without myelopathy or radiculopathy, lumbar region: Secondary | ICD-10-CM | POA: Diagnosis not present

## 2018-05-30 DIAGNOSIS — H2512 Age-related nuclear cataract, left eye: Secondary | ICD-10-CM | POA: Diagnosis not present

## 2018-05-30 DIAGNOSIS — Z961 Presence of intraocular lens: Secondary | ICD-10-CM | POA: Diagnosis not present

## 2018-06-04 DIAGNOSIS — I10 Essential (primary) hypertension: Secondary | ICD-10-CM | POA: Diagnosis not present

## 2018-06-04 DIAGNOSIS — F0280 Dementia in other diseases classified elsewhere without behavioral disturbance: Secondary | ICD-10-CM | POA: Diagnosis not present

## 2018-06-04 DIAGNOSIS — M25551 Pain in right hip: Secondary | ICD-10-CM | POA: Diagnosis not present

## 2018-06-04 DIAGNOSIS — R05 Cough: Secondary | ICD-10-CM | POA: Diagnosis not present

## 2018-06-04 DIAGNOSIS — M810 Age-related osteoporosis without current pathological fracture: Secondary | ICD-10-CM | POA: Diagnosis not present

## 2018-06-04 DIAGNOSIS — Z6823 Body mass index (BMI) 23.0-23.9, adult: Secondary | ICD-10-CM | POA: Diagnosis not present

## 2018-06-05 ENCOUNTER — Inpatient Hospital Stay (HOSPITAL_COMMUNITY): Admission: RE | Admit: 2018-06-05 | Payer: Medicare Other | Source: Ambulatory Visit

## 2018-06-12 ENCOUNTER — Inpatient Hospital Stay: Admit: 2018-06-12 | Payer: Medicare Other | Admitting: Orthopedic Surgery

## 2018-06-12 SURGERY — ARTHROPLASTY, HIP, TOTAL, ANTERIOR APPROACH
Anesthesia: Spinal | Laterality: Right

## 2018-07-02 DIAGNOSIS — M7062 Trochanteric bursitis, left hip: Secondary | ICD-10-CM | POA: Diagnosis not present

## 2018-07-02 DIAGNOSIS — M25552 Pain in left hip: Secondary | ICD-10-CM | POA: Diagnosis not present

## 2018-07-27 DIAGNOSIS — R1031 Right lower quadrant pain: Secondary | ICD-10-CM | POA: Diagnosis not present

## 2018-07-27 DIAGNOSIS — I10 Essential (primary) hypertension: Secondary | ICD-10-CM | POA: Diagnosis not present

## 2018-08-13 DIAGNOSIS — N390 Urinary tract infection, site not specified: Secondary | ICD-10-CM | POA: Diagnosis not present

## 2018-08-16 DIAGNOSIS — R1031 Right lower quadrant pain: Secondary | ICD-10-CM | POA: Diagnosis not present

## 2018-08-27 DIAGNOSIS — M7918 Myalgia, other site: Secondary | ICD-10-CM | POA: Diagnosis not present

## 2018-08-27 DIAGNOSIS — M47816 Spondylosis without myelopathy or radiculopathy, lumbar region: Secondary | ICD-10-CM | POA: Diagnosis not present

## 2018-08-27 DIAGNOSIS — M545 Low back pain: Secondary | ICD-10-CM | POA: Diagnosis not present

## 2018-08-28 DIAGNOSIS — N390 Urinary tract infection, site not specified: Secondary | ICD-10-CM | POA: Diagnosis not present

## 2018-08-28 DIAGNOSIS — R8290 Unspecified abnormal findings in urine: Secondary | ICD-10-CM | POA: Diagnosis not present

## 2018-09-12 DIAGNOSIS — R350 Frequency of micturition: Secondary | ICD-10-CM | POA: Diagnosis not present

## 2018-09-12 DIAGNOSIS — Z20828 Contact with and (suspected) exposure to other viral communicable diseases: Secondary | ICD-10-CM | POA: Diagnosis not present

## 2018-10-08 DIAGNOSIS — B349 Viral infection, unspecified: Secondary | ICD-10-CM | POA: Diagnosis not present

## 2018-10-08 DIAGNOSIS — Z20818 Contact with and (suspected) exposure to other bacterial communicable diseases: Secondary | ICD-10-CM | POA: Diagnosis not present

## 2018-10-08 DIAGNOSIS — I10 Essential (primary) hypertension: Secondary | ICD-10-CM | POA: Diagnosis not present

## 2018-10-08 DIAGNOSIS — R05 Cough: Secondary | ICD-10-CM | POA: Diagnosis not present

## 2018-11-30 DIAGNOSIS — M810 Age-related osteoporosis without current pathological fracture: Secondary | ICD-10-CM | POA: Diagnosis not present

## 2018-11-30 DIAGNOSIS — E7849 Other hyperlipidemia: Secondary | ICD-10-CM | POA: Diagnosis not present

## 2018-11-30 DIAGNOSIS — R82998 Other abnormal findings in urine: Secondary | ICD-10-CM | POA: Diagnosis not present

## 2018-12-04 DIAGNOSIS — M25551 Pain in right hip: Secondary | ICD-10-CM | POA: Diagnosis not present

## 2018-12-04 DIAGNOSIS — M5416 Radiculopathy, lumbar region: Secondary | ICD-10-CM | POA: Diagnosis not present

## 2018-12-04 DIAGNOSIS — R05 Cough: Secondary | ICD-10-CM | POA: Diagnosis not present

## 2018-12-04 DIAGNOSIS — E559 Vitamin D deficiency, unspecified: Secondary | ICD-10-CM | POA: Diagnosis not present

## 2018-12-04 DIAGNOSIS — Z0000 Encounter for general adult medical examination without abnormal findings: Secondary | ICD-10-CM | POA: Diagnosis not present

## 2018-12-04 DIAGNOSIS — M810 Age-related osteoporosis without current pathological fracture: Secondary | ICD-10-CM | POA: Diagnosis not present

## 2018-12-04 DIAGNOSIS — Z1331 Encounter for screening for depression: Secondary | ICD-10-CM | POA: Diagnosis not present

## 2018-12-04 DIAGNOSIS — E785 Hyperlipidemia, unspecified: Secondary | ICD-10-CM | POA: Diagnosis not present

## 2018-12-04 DIAGNOSIS — F0280 Dementia in other diseases classified elsewhere without behavioral disturbance: Secondary | ICD-10-CM | POA: Diagnosis not present

## 2018-12-04 DIAGNOSIS — Z7189 Other specified counseling: Secondary | ICD-10-CM | POA: Diagnosis not present

## 2018-12-04 DIAGNOSIS — I10 Essential (primary) hypertension: Secondary | ICD-10-CM | POA: Diagnosis not present

## 2018-12-27 DIAGNOSIS — Z23 Encounter for immunization: Secondary | ICD-10-CM | POA: Diagnosis not present

## 2019-01-03 DIAGNOSIS — D485 Neoplasm of uncertain behavior of skin: Secondary | ICD-10-CM | POA: Diagnosis not present

## 2019-01-03 DIAGNOSIS — Z85828 Personal history of other malignant neoplasm of skin: Secondary | ICD-10-CM | POA: Diagnosis not present

## 2019-01-03 DIAGNOSIS — L304 Erythema intertrigo: Secondary | ICD-10-CM | POA: Diagnosis not present

## 2019-01-03 DIAGNOSIS — D1801 Hemangioma of skin and subcutaneous tissue: Secondary | ICD-10-CM | POA: Diagnosis not present

## 2019-01-03 DIAGNOSIS — L821 Other seborrheic keratosis: Secondary | ICD-10-CM | POA: Diagnosis not present

## 2019-01-03 DIAGNOSIS — C44329 Squamous cell carcinoma of skin of other parts of face: Secondary | ICD-10-CM | POA: Diagnosis not present

## 2019-01-03 DIAGNOSIS — L570 Actinic keratosis: Secondary | ICD-10-CM | POA: Diagnosis not present

## 2019-01-03 DIAGNOSIS — L814 Other melanin hyperpigmentation: Secondary | ICD-10-CM | POA: Diagnosis not present

## 2019-01-03 DIAGNOSIS — L853 Xerosis cutis: Secondary | ICD-10-CM | POA: Diagnosis not present

## 2019-01-07 DIAGNOSIS — H1089 Other conjunctivitis: Secondary | ICD-10-CM | POA: Diagnosis not present

## 2019-01-11 DIAGNOSIS — C44229 Squamous cell carcinoma of skin of left ear and external auricular canal: Secondary | ICD-10-CM | POA: Diagnosis not present

## 2019-02-28 DIAGNOSIS — F0280 Dementia in other diseases classified elsewhere without behavioral disturbance: Secondary | ICD-10-CM | POA: Diagnosis not present

## 2019-02-28 DIAGNOSIS — L989 Disorder of the skin and subcutaneous tissue, unspecified: Secondary | ICD-10-CM | POA: Diagnosis not present

## 2019-03-06 DIAGNOSIS — M7918 Myalgia, other site: Secondary | ICD-10-CM | POA: Diagnosis not present

## 2019-03-06 DIAGNOSIS — M47816 Spondylosis without myelopathy or radiculopathy, lumbar region: Secondary | ICD-10-CM | POA: Diagnosis not present

## 2019-03-06 DIAGNOSIS — M545 Low back pain: Secondary | ICD-10-CM | POA: Diagnosis not present

## 2019-03-14 NOTE — Progress Notes (Signed)
Virtual Visit via Video Note changed to phone visit at patient request.   This visit type was conducted due to national recommendations for restrictions regarding the COVID-19 Pandemic (e.g. social distancing) in an effort to limit this patient's exposure and mitigate transmission in our community.  Due to her co-morbid illnesses, this patient is at least at moderate risk for complications without adequate follow up.  This format is felt to be most appropriate for this patient at this time.  All issues noted in this document were discussed and addressed.  A limited physical exam was performed with this format.  Please refer to the patient's chart for her consent to telehealth for [facility].   HPI: FU hypertension and hyperlipidemia. An echocardiogram performed in November of 2008 showed normal LV function. There was trivial aortic insufficiency. Since I last saw her,  there is no dyspnea, chest pain, palpitations or syncope.  Her caregiver is assisting with the phone visit.  Current Outpatient Medications  Medication Sig Dispense Refill  . acetaminophen (TYLENOL 8 HOUR) 650 MG CR tablet Take 1,300 mg by mouth every 8 (eight) hours as needed for pain.     . Cholecalciferol (VITAMIN D3) 50 MCG (2000 UT) TABS Take 2,000 Units by mouth daily.    . diclofenac sodium (VOLTAREN) 1 % GEL Apply 1 application topically 3 (three) times daily as needed (for back pain).    Marland Kitchen docusate sodium (COLACE) 50 MG capsule Take 50 mg by mouth daily.    Marland Kitchen escitalopram (LEXAPRO) 5 MG tablet TAKE 1 TABLET BY MOUTH DAILY*CALL OFFICE TO MAKE APPOINTMENT* (Patient taking differently: Take 5 mg by mouth daily. ) 30 tablet 0  . fluticasone (FLONASE) 50 MCG/ACT nasal spray Place 1 spray into both nostrils daily as needed for allergies or rhinitis.    Marland Kitchen gabapentin (NEURONTIN) 100 MG capsule Take 100 mg by mouth 2 (two) times daily.    Marland Kitchen galantamine (RAZADYNE ER) 24 MG 24 hr capsule TAKE 1 CAPSULE BY MOUTH EVERY DAY  (Patient taking differently: Take 24 mg by mouth daily with breakfast. ) 90 capsule 1  . hydrocortisone (PROCTOZONE-HC) 2.5 % rectal cream Place 1 application rectally 2 (two) times daily as needed for hemorrhoids or anal itching.     . linaclotide (LINZESS) 72 MCG capsule Take 72 mcg by mouth daily before breakfast.    . Multiple Vitamin (MULTIVITAMIN WITH MINERALS) TABS tablet Take 1 tablet by mouth daily.    Marland Kitchen NAMENDA XR 28 MG CP24 24 hr capsule TAKE 1 CAPSULE(28 MG) BY MOUTH DAILY (Patient taking differently: Take 28 mg by mouth daily. ) 30 capsule 5  . nystatin cream (MYCOSTATIN) Apply 1 application topically 2 (two) times daily as needed (for yeast infection).    Marland Kitchen olmesartan (BENICAR) 20 MG tablet Take 20 mg by mouth daily.     . traMADol (ULTRAM) 50 MG tablet Take 50 mg by mouth every 6 (six) hours as needed.     No current facility-administered medications for this visit.      Past Medical History:  Diagnosis Date  . Arthritis   . Disturbances of sensation of smell and taste   . Essential hypertension    a.  02/2007 Echo: Nl EF, triv AI.  Marland Kitchen GERD (gastroesophageal reflux disease)   . Lichen sclerosus   . Lipoma of face   . Memory loss   . Mixed hyperlipidemia   . Osteopenia   . Other persistent mental disorders due to conditions classified elsewhere   .  Tubular adenoma of colon 2017    Past Surgical History:  Procedure Laterality Date  . CATARACT EXTRACTION, BILATERAL    . OOPHORECTOMY  1988   DIAG LAP W BSO  . TONSILECTOMY, ADENOIDECTOMY, BILATERAL MYRINGOTOMY AND TUBES    . TOTAL HIP ARTHROPLASTY Left 09/01/2015   Procedure: LEFT TOTAL HIP ARTHROPLASTY ANTERIOR APPROACH;  Surgeon: Paralee Cancel, MD;  Location: [facility];  Service: Orthopedics;  Laterality: Left;    Social History   Socioeconomic History  . Marital status: Married    Spouse name: Not on file  . Number of children: 3  . Years of education: Not on file  . Highest education level: Not on file   Occupational History  . Occupation: retired travel Music therapist    Comment: full time  Social Needs  . Financial resource strain: Not on file  . Food insecurity    Worry: Not on file    Inability: Not on file  . Transportation needs    Medical: Not on file    Non-medical: Not on file  Tobacco Use  . Smoking status: Former Smoker    Quit date: 09/13/2002    Years since quitting: 16.5  . Smokeless tobacco: Never Used  Substance and Sexual Activity  . Alcohol use: Yes    Alcohol/week: 21.0 standard drinks    Types: 14 Glasses of wine, 7 Standard drinks or equivalent per week    Comment: 2 glasses of wine each evening  . Drug use: No  . Sexual activity: Not Currently  Lifestyle  . Physical activity    Days per week: Not on file    Minutes per session: Not on file  . Stress: Not on file  Relationships  . Social Herbalist on phone: Not on file    Gets together: Not on file    Attends religious service: Not on file    Active member of club or organization: Not on file    Attends meetings of clubs or organizations: Not on file    Relationship status: Not on file  . Intimate partner violence    Fear of current or ex partner: Not on file    Emotionally abused: Not on file    Physically abused: Not on file    Forced sexual activity: Not on file  Other Topics Concern  . Not on file  Social History Narrative   This patient is widowed. She lives alone  in a single level  home in [place], [place] from [place] to assist with meals and transportation.    Patient is retired Garment/textile technologist.    Main exercise is walking, she has a Programmer, systems.   Stopped smoking 20 years ago, drinks 1-2 glasses of wine a  day   Patient has 2 sons and one daughter, all live out of town.   Has a Living Will, DO NOT RESUSCITATE.    Family History  Problem Relation Age of Onset  . Cancer Mother        OVARIAN  . Diabetes Father   . Hypertension Father     ROS:  Back pain but no fevers or chills, productive cough, hemoptysis, dysphasia, odynophagia, melena, hematochezia, dysuria, hematuria, rash, seizure activity, orthopnea, PND, pedal edema, claudication. Remaining systems are negative.  Physical Exam: Patient answers questions appropriately. Normal affect Remainder of physical examination not performed (coronavirus pandemic, telehealth visit).   A/P  1 hypertension-blood pressure is controlled.  Continue present medications and follow.  2 hyperlipidemia-Per primary care.  3 significant dementia  COVID-19 Education: The importance of social distancing was discussed today.  Time:   Today, I have spent 15 minutes with the patient with telehealth technology discussing the above problems.     Medication Adjustments/Labs and Tests Ordered: Current medicines are reviewed at length with the patient today.  Concerns regarding medicines are outlined above.   Tests Ordered: No orders of the defined types were placed in this encounter.   Medication Changes: No orders of the defined types were placed in this encounter.   Follow Up:  PRN  Signed, Kirk Ruths, MD  03/19/2019 3:36 PM    [facility]

## 2019-03-25 ENCOUNTER — Telehealth (INDEPENDENT_AMBULATORY_CARE_PROVIDER_SITE_OTHER): Payer: Medicare Other | Admitting: Cardiology

## 2019-03-25 VITALS — BP 150/80 | HR 66 | Ht 59.0 in | Wt 119.0 lb

## 2019-03-25 DIAGNOSIS — E7800 Pure hypercholesterolemia, unspecified: Secondary | ICD-10-CM

## 2019-03-25 DIAGNOSIS — I10 Essential (primary) hypertension: Secondary | ICD-10-CM

## 2019-03-25 NOTE — Patient Instructions (Signed)
Medication Instructions:  NO CHANGE *If you need a refill on your cardiac medications before your next appointment, please call your pharmacy*  Lab Work: If you have labs (blood work) drawn today and your tests are completely normal, you will receive your results only by: Marland Kitchen MyChart Message (if you have MyChart) OR . A paper copy in the mail If you have any lab test that is abnormal or we need to change your treatment, we will call you to review the results.  Follow-Up: At [facility], you and your health needs are our priority.  As part of our continuing mission to provide you with exceptional heart care, we have created designated Provider Care Teams.  These Care Teams include your primary Cardiologist (physician) and Advanced Practice Providers (APPs -  Physician Assistants and Nurse Practitioners) who all work together to provide you with the care you need, when you need it.  Your physician recommends that you schedule a follow-up appointment in: AS NEEDED WITH DR Stanford Breed

## 2019-03-26 DIAGNOSIS — M47816 Spondylosis without myelopathy or radiculopathy, lumbar region: Secondary | ICD-10-CM | POA: Diagnosis not present

## 2019-04-08 DIAGNOSIS — M7918 Myalgia, other site: Secondary | ICD-10-CM | POA: Diagnosis not present

## 2019-04-08 DIAGNOSIS — M47816 Spondylosis without myelopathy or radiculopathy, lumbar region: Secondary | ICD-10-CM | POA: Diagnosis not present

## 2019-04-08 DIAGNOSIS — M545 Low back pain: Secondary | ICD-10-CM | POA: Diagnosis not present

## 2019-04-08 DIAGNOSIS — Z6825 Body mass index (BMI) 25.0-25.9, adult: Secondary | ICD-10-CM | POA: Diagnosis not present

## 2019-04-08 DIAGNOSIS — M5106 Intervertebral disc disorders with myelopathy, lumbar region: Secondary | ICD-10-CM | POA: Diagnosis not present

## 2019-05-10 ENCOUNTER — Other Ambulatory Visit: Payer: Self-pay | Admitting: Rehabilitation

## 2019-05-10 DIAGNOSIS — M47816 Spondylosis without myelopathy or radiculopathy, lumbar region: Secondary | ICD-10-CM

## 2019-05-21 ENCOUNTER — Ambulatory Visit
Admission: RE | Admit: 2019-05-21 | Discharge: 2019-05-21 | Disposition: A | Discharge status: Home / Self Care | Payer: Medicare Other | Source: Ambulatory Visit | Attending: Rehabilitation | Admitting: Rehabilitation

## 2019-05-21 ENCOUNTER — Other Ambulatory Visit: Payer: Self-pay

## 2019-05-21 DIAGNOSIS — M48061 Spinal stenosis, lumbar region without neurogenic claudication: Secondary | ICD-10-CM | POA: Diagnosis not present

## 2019-05-21 DIAGNOSIS — M47816 Spondylosis without myelopathy or radiculopathy, lumbar region: Secondary | ICD-10-CM

## 2019-05-23 DIAGNOSIS — Z20818 Contact with and (suspected) exposure to other bacterial communicable diseases: Secondary | ICD-10-CM | POA: Diagnosis not present

## 2019-05-23 DIAGNOSIS — R05 Cough: Secondary | ICD-10-CM | POA: Diagnosis not present

## 2019-05-23 DIAGNOSIS — J029 Acute pharyngitis, unspecified: Secondary | ICD-10-CM | POA: Diagnosis not present

## 2019-05-28 DIAGNOSIS — M5416 Radiculopathy, lumbar region: Secondary | ICD-10-CM | POA: Diagnosis not present

## 2019-05-28 DIAGNOSIS — Z6825 Body mass index (BMI) 25.0-25.9, adult: Secondary | ICD-10-CM | POA: Diagnosis not present

## 2019-05-28 DIAGNOSIS — M47816 Spondylosis without myelopathy or radiculopathy, lumbar region: Secondary | ICD-10-CM | POA: Diagnosis not present

## 2019-06-06 DIAGNOSIS — H25812 Combined forms of age-related cataract, left eye: Secondary | ICD-10-CM | POA: Diagnosis not present

## 2019-06-06 DIAGNOSIS — Z961 Presence of intraocular lens: Secondary | ICD-10-CM | POA: Diagnosis not present

## 2019-06-06 DIAGNOSIS — D3141 Benign neoplasm of right ciliary body: Secondary | ICD-10-CM | POA: Diagnosis not present

## 2019-06-24 DIAGNOSIS — I10 Essential (primary) hypertension: Secondary | ICD-10-CM | POA: Diagnosis not present

## 2019-06-24 DIAGNOSIS — M5416 Radiculopathy, lumbar region: Secondary | ICD-10-CM | POA: Diagnosis not present

## 2019-06-24 DIAGNOSIS — F3340 Major depressive disorder, recurrent, in remission, unspecified: Secondary | ICD-10-CM | POA: Diagnosis present

## 2019-06-24 DIAGNOSIS — M810 Age-related osteoporosis without current pathological fracture: Secondary | ICD-10-CM | POA: Diagnosis not present

## 2019-06-24 DIAGNOSIS — E785 Hyperlipidemia, unspecified: Secondary | ICD-10-CM | POA: Diagnosis not present

## 2019-06-24 DIAGNOSIS — F0280 Dementia in other diseases classified elsewhere without behavioral disturbance: Secondary | ICD-10-CM | POA: Diagnosis not present

## 2019-06-24 DIAGNOSIS — F3341 Major depressive disorder, recurrent, in partial remission: Secondary | ICD-10-CM | POA: Diagnosis not present

## 2019-06-26 DIAGNOSIS — M5416 Radiculopathy, lumbar region: Secondary | ICD-10-CM | POA: Diagnosis not present

## 2019-07-04 DIAGNOSIS — M818 Other osteoporosis without current pathological fracture: Secondary | ICD-10-CM | POA: Diagnosis not present

## 2019-07-11 DIAGNOSIS — L570 Actinic keratosis: Secondary | ICD-10-CM | POA: Diagnosis not present

## 2019-07-11 DIAGNOSIS — Z85828 Personal history of other malignant neoplasm of skin: Secondary | ICD-10-CM | POA: Diagnosis not present

## 2019-07-11 DIAGNOSIS — L820 Inflamed seborrheic keratosis: Secondary | ICD-10-CM | POA: Diagnosis not present

## 2019-07-11 DIAGNOSIS — D1801 Hemangioma of skin and subcutaneous tissue: Secondary | ICD-10-CM | POA: Diagnosis not present

## 2019-07-11 DIAGNOSIS — L578 Other skin changes due to chronic exposure to nonionizing radiation: Secondary | ICD-10-CM | POA: Diagnosis not present

## 2019-07-11 DIAGNOSIS — L720 Epidermal cyst: Secondary | ICD-10-CM | POA: Diagnosis not present

## 2019-07-11 DIAGNOSIS — L218 Other seborrheic dermatitis: Secondary | ICD-10-CM | POA: Diagnosis not present

## 2019-07-11 DIAGNOSIS — M419 Scoliosis, unspecified: Secondary | ICD-10-CM | POA: Diagnosis not present

## 2019-08-13 DIAGNOSIS — R809 Proteinuria, unspecified: Secondary | ICD-10-CM | POA: Diagnosis not present

## 2019-08-14 DIAGNOSIS — R928 Other abnormal and inconclusive findings on diagnostic imaging of breast: Secondary | ICD-10-CM | POA: Diagnosis not present

## 2019-08-14 DIAGNOSIS — N644 Mastodynia: Secondary | ICD-10-CM | POA: Diagnosis not present

## 2019-09-10 DIAGNOSIS — M5416 Radiculopathy, lumbar region: Secondary | ICD-10-CM | POA: Diagnosis not present

## 2019-09-20 DIAGNOSIS — M5416 Radiculopathy, lumbar region: Secondary | ICD-10-CM | POA: Diagnosis not present

## 2019-11-13 ENCOUNTER — Ambulatory Visit: Payer: Medicare Other | Admitting: Gastroenterology

## 2019-11-18 DIAGNOSIS — R030 Elevated blood-pressure reading, without diagnosis of hypertension: Secondary | ICD-10-CM | POA: Diagnosis not present

## 2019-11-18 DIAGNOSIS — T84498A Other mechanical complication of other internal orthopedic devices, implants and grafts, initial encounter: Secondary | ICD-10-CM | POA: Diagnosis not present

## 2019-11-18 DIAGNOSIS — S32009K Unspecified fracture of unspecified lumbar vertebra, subsequent encounter for fracture with nonunion: Secondary | ICD-10-CM | POA: Diagnosis not present

## 2019-12-03 DIAGNOSIS — E7849 Other hyperlipidemia: Secondary | ICD-10-CM | POA: Diagnosis not present

## 2019-12-03 DIAGNOSIS — M810 Age-related osteoporosis without current pathological fracture: Secondary | ICD-10-CM | POA: Diagnosis not present

## 2019-12-06 ENCOUNTER — Ambulatory Visit: Payer: Medicare Other | Admitting: Gastroenterology

## 2019-12-10 DIAGNOSIS — F0280 Dementia in other diseases classified elsewhere without behavioral disturbance: Secondary | ICD-10-CM | POA: Diagnosis not present

## 2019-12-10 DIAGNOSIS — R809 Proteinuria, unspecified: Secondary | ICD-10-CM | POA: Diagnosis not present

## 2019-12-10 DIAGNOSIS — E785 Hyperlipidemia, unspecified: Secondary | ICD-10-CM | POA: Diagnosis not present

## 2019-12-10 DIAGNOSIS — F3341 Major depressive disorder, recurrent, in partial remission: Secondary | ICD-10-CM | POA: Diagnosis not present

## 2019-12-10 DIAGNOSIS — M5416 Radiculopathy, lumbar region: Secondary | ICD-10-CM | POA: Diagnosis not present

## 2019-12-10 DIAGNOSIS — M810 Age-related osteoporosis without current pathological fracture: Secondary | ICD-10-CM | POA: Diagnosis not present

## 2019-12-10 DIAGNOSIS — R82998 Other abnormal findings in urine: Secondary | ICD-10-CM | POA: Diagnosis not present

## 2019-12-10 DIAGNOSIS — I10 Essential (primary) hypertension: Secondary | ICD-10-CM | POA: Diagnosis not present

## 2019-12-10 DIAGNOSIS — G309 Alzheimer's disease, unspecified: Secondary | ICD-10-CM | POA: Diagnosis not present

## 2019-12-10 DIAGNOSIS — E559 Vitamin D deficiency, unspecified: Secondary | ICD-10-CM | POA: Diagnosis not present

## 2019-12-10 DIAGNOSIS — Z0000 Encounter for general adult medical examination without abnormal findings: Secondary | ICD-10-CM | POA: Diagnosis not present

## 2019-12-12 DIAGNOSIS — M48062 Spinal stenosis, lumbar region with neurogenic claudication: Secondary | ICD-10-CM | POA: Diagnosis not present

## 2019-12-12 DIAGNOSIS — M4186 Other forms of scoliosis, lumbar region: Secondary | ICD-10-CM | POA: Diagnosis not present

## 2019-12-12 DIAGNOSIS — M7062 Trochanteric bursitis, left hip: Secondary | ICD-10-CM | POA: Diagnosis not present

## 2019-12-12 DIAGNOSIS — M5442 Lumbago with sciatica, left side: Secondary | ICD-10-CM | POA: Diagnosis not present

## 2019-12-25 ENCOUNTER — Encounter: Payer: Self-pay | Admitting: Nurse Practitioner

## 2019-12-25 ENCOUNTER — Ambulatory Visit (INDEPENDENT_AMBULATORY_CARE_PROVIDER_SITE_OTHER): Payer: Medicare Other | Admitting: Nurse Practitioner

## 2019-12-25 VITALS — BP 110/60 | HR 88 | Ht 59.0 in | Wt 112.4 lb

## 2019-12-25 DIAGNOSIS — N760 Acute vaginitis: Secondary | ICD-10-CM | POA: Diagnosis not present

## 2019-12-25 DIAGNOSIS — K649 Unspecified hemorrhoids: Secondary | ICD-10-CM | POA: Diagnosis not present

## 2019-12-25 DIAGNOSIS — K5900 Constipation, unspecified: Secondary | ICD-10-CM | POA: Diagnosis not present

## 2019-12-25 NOTE — Progress Notes (Signed)
12/25/2019 Margaret [name] [id number] 05-Feb-1935   Chief Complaint: Rectal discharge, rectal bleeding   History of Present Illness: Abrish Erny is an 85 year old female with a past medical history of arthritis, osteopenia, dementia, GERD and colon polyps.  She was last seen in our office by Dr. Fuller Plan on 05/08/2018 due to having constipation and rectal pain.  At that time, she completed a 7-day course of Diflucan and Nystatin for perianal fungal infection. Dr. Fuller Plan assessed she had internal hemorrhoids and she was advised to use hydrocortisone cream to the anal area and Preparation H suppositories as needed when hemorrhoidal symptoms active.  She presents today accompanied by her long term home Estate agent.  Her home health assistant reported seeing a white discharge on the patient's undergarment today and on one occasion a few weeks ago. No obvious signs of anal itchiness or anorectal pain. No difficulty sitting.  She also reported seeing bright red blood on the patient's hands on one occasion 3 to 4 weeks ago. She suspected the patient was constipated at that time and possibly tried to manually remove the stool from her rectum. No further rectal bleeding since then. She typically passes a normal brown soft stool 2 to 3 times daily as long as she take Linzess 20mcg QOD, Miralax and a stool softener QD.  She has a history of recurrent oral candidiasis for which she takes a statin suspension as needed.  No recent antibiotics.  She has lost a few pounds over the past year which occurred after she stopped eating [employer] ice cream bars.   Colonoscopy 05/15/2015: 1. Pedunculated polyp in the sigmoid colon; polypectomy performed using snare cautery (tubular adenoma) 2. Moderate diverticulosis in the sigmoid colon and descending colon 3. Grade l internal hemorrhoids Surgical [P], sigmoid, polyp - TUBULAR ADENOMA. - NO HIGH GRADE DYSPLASIA OR MALIGNANCY   Current Outpatient Medications  on File Prior to Visit  Medication Sig Dispense Refill  . acetaminophen (TYLENOL 8 HOUR) 650 MG CR tablet Take 1,300 mg by mouth every 8 (eight) hours as needed for pain.     Marland Kitchen amLODipine (NORVASC) 5 MG tablet Take 1 tablet by mouth daily.    . Cholecalciferol (VITAMIN D3) 50 MCG (2000 UT) TABS Take 2,000 Units by mouth daily.    . diclofenac sodium (VOLTAREN) 1 % GEL Apply 1 application topically 3 (three) times daily as needed (for back pain).    Marland Kitchen docusate sodium (COLACE) 50 MG capsule Take 50 mg by mouth daily.    Marland Kitchen escitalopram (LEXAPRO) 5 MG tablet TAKE 1 TABLET BY MOUTH DAILY*CALL OFFICE TO MAKE APPOINTMENT* (Patient taking differently: Take 10 mg by mouth daily. ) 30 tablet 0  . fluticasone (FLONASE) 50 MCG/ACT nasal spray Place 1 spray into both nostrils daily as needed for allergies or rhinitis.    Marland Kitchen gabapentin (NEURONTIN) 100 MG capsule Take 300 mg by mouth 3 (three) times daily.     Marland Kitchen galantamine (RAZADYNE ER) 24 MG 24 hr capsule TAKE 1 CAPSULE BY MOUTH EVERY DAY (Patient taking differently: Take 24 mg by mouth daily with breakfast. ) 90 capsule 1  . hydrocortisone (PROCTOZONE-HC) 2.5 % rectal cream Place 1 application rectally 2 (two) times daily as needed for hemorrhoids or anal itching.     . linaclotide (LINZESS) 72 MCG capsule Take 72 mcg by mouth every other day.     . Multiple Vitamin (MULTIVITAMIN WITH MINERALS) TABS tablet Take 1 tablet by mouth daily.    Marland Kitchen  NAMENDA XR 28 MG CP24 24 hr capsule TAKE 1 CAPSULE(28 MG) BY MOUTH DAILY (Patient taking differently: Take 28 mg by mouth daily. ) 30 capsule 5  . nystatin cream (MYCOSTATIN) Apply 1 application topically 2 (two) times daily as needed (for yeast infection).    Marland Kitchen olmesartan (BENICAR) 20 MG tablet Take 40 mg by mouth daily.     . polyethylene glycol (MIRALAX / GLYCOLAX) 17 g packet Take 17 g by mouth daily.    . traMADol (ULTRAM) 50 MG tablet Take 50 mg by mouth in the morning, at noon, and at bedtime.      No current  facility-administered medications on file prior to visit.    Allergies  Allergen Reactions  . Sulfa Antibiotics Other (See Comments)    Reaction unknown    Current Medications, Allergies, Past Medical History, Past Surgical History, Family History and Social History were reviewed in [employer] record.   Physical Exam: BP 110/60   Pulse 88   Ht 4\' 11"  (1.499 m)   Wt 112 lb 6.4 oz (51 kg)   BMI 22.70 kg/m   General: 85 year old female in no acute distress. Head: Normocephalic and atraumatic. Eyes: No scleral icterus. Conjunctiva pink . Ears: Normal auditory acuity. Mouth: Dentition intact. No ulcers or lesions. Few small white plaques on tongue surface.  Lungs: Clear throughout to auscultation. Heart: Regular rate and rhythm, systolic murmur loudest at the AV.  Abdomen: Soft, nontender and nondistended. No masses or hepatomegaly. Normal bowel sounds x 4 quadrants. Right hip protrudes anteriorly.  Rectal: No perianal erythema or lesions.  One small inflamed purple anal hemorrhoid without evidence of active bleeding.  Small amount of brown stool around anal opening. A small amount of clear to white drainage noted from vaginal orifice, draining down toward the perineum. Lower external labia white/possible lichen sclerosis.  Musculoskeletal: Symmetrical with no gross deformities. Extremities: No edema. Neurological: Alert oriented x 1. She is conversant. Speech is clear. She moves all extremities. Ambulates with a walker.  Psychological: Alert and cooperative. Normal mood and affect  Assessment and Recommendations:  29.  85 year old female with vaginitis.  -Patient to follow-up with her PCP or GYN for further evaluation and vaginal culture as she may have a combination of bacterial and candidiasis vaginitis  2.  Rectal bleeding most likely due to anal hemorrhoid, no current rectal bleeding -Continue Linzess, MiraLAX and stool softener as previously  prescribed -Desitin apply a small amount inside the anal area and external anal area 3 times daily as needed -Patient's family or home medical assistant to call our office if rectal bleeding recurs  3.  Constipation -See plan in #2  4.  Oral candidiasis, recurrent -Refer treatment to PCP  5.  History of tubular adenomatous colon polyps.  No further colonoscopies recommended due to age.

## 2019-12-25 NOTE — Progress Notes (Signed)
Reviewed and agree with management plan.  Mrytle Bento T. Matilynn Dacey, MD FACG [facility]  

## 2019-12-25 NOTE — Patient Instructions (Addendum)
If you are age 83 or older, your body mass index should be between 23-30. Your Body mass index is 22.7 kg/m. If this is out of the aforementioned range listed, please consider follow up with your Primary Care Provider.  If you are age 71 or younger, your body mass index should be between 19-25. Your Body mass index is 22.7 kg/m. If this is out of the aformentioned range listed, please consider follow up with your Primary Care Provider.   1. Contact your Primary Care Physician or Gynecologist regarding evaluation and treatment of vaginitis. 2.Take Miralax 1 capful mixed in 8 ounces of water at bed time for constipation as tolerated. Continue Linzess, and your stool softener 3. Apply a small amount of Desitin inside the anal opening and to the external anal area tid as needed for anal or hemorrhoidal irritation/bleeding.  4. Follow up as needed.  Due to recent changes in healthcare laws, you may see the results of your imaging and laboratory studies on MyChart before your provider has had a chance to review them.  We understand that in some cases there may be results that are confusing or concerning to you. Not all laboratory results come back in the same time frame and the provider may be waiting for multiple results in order to interpret others.  Please give [place] 48 hours in order for your provider to thoroughly review all the results before contacting the office for clarification of your results.   Thank you for choosing [facility] Noralyn Pick, CRNP

## 2019-12-31 ENCOUNTER — Other Ambulatory Visit: Payer: Self-pay

## 2019-12-31 ENCOUNTER — Encounter: Payer: Self-pay | Admitting: Obstetrics and Gynecology

## 2019-12-31 ENCOUNTER — Ambulatory Visit (INDEPENDENT_AMBULATORY_CARE_PROVIDER_SITE_OTHER): Payer: Medicare Other | Admitting: Obstetrics and Gynecology

## 2019-12-31 VITALS — BP 110/64

## 2019-12-31 DIAGNOSIS — L900 Lichen sclerosus et atrophicus: Secondary | ICD-10-CM | POA: Diagnosis not present

## 2019-12-31 DIAGNOSIS — B373 Candidiasis of vulva and vagina: Secondary | ICD-10-CM

## 2019-12-31 DIAGNOSIS — B3731 Acute candidiasis of vulva and vagina: Secondary | ICD-10-CM

## 2019-12-31 LAB — WET PREP FOR TRICH, YEAST, CLUE

## 2019-12-31 NOTE — Progress Notes (Signed)
Margaret [name] March 30, 1935 [id number]  SUBJECTIVE:  85 y.o. [id number] female presents with her care attendant for evaluation of a transient white vaginal discharge that seems to recur.  She has been empirically treated for vaginal candidiasis per her primary doctor with Diflucan but feels that the discharge continues to return.  She is not taking any hormones.  She also denies any vaginal irritation, pain, burning, bleeding, or itching.  No urinary tract symptoms.  Last seen here in 2013.  Discharge is clear and running white, no thickness or odor.  Currently she is being treated for bacterial vaginosis with oral Flagyl for 7 days.  Feels that the discharge has somewhat cleared up since taking this medication.  Not using any hormones or vaginal creams.   Current Outpatient Medications  Medication Sig Dispense Refill  . acetaminophen (TYLENOL 8 HOUR) 650 MG CR tablet Take 1,300 mg by mouth every 8 (eight) hours as needed for pain.     Marland Kitchen amLODipine (NORVASC) 5 MG tablet Take 1 tablet by mouth daily.    . Cholecalciferol (VITAMIN D3) 50 MCG (2000 UT) TABS Take 2,000 Units by mouth daily.    . diclofenac sodium (VOLTAREN) 1 % GEL Apply 1 application topically 3 (three) times daily as needed (for back pain).    Marland Kitchen docusate sodium (COLACE) 50 MG capsule Take 50 mg by mouth daily.    Marland Kitchen escitalopram (LEXAPRO) 5 MG tablet TAKE 1 TABLET BY MOUTH DAILY*CALL OFFICE TO MAKE APPOINTMENT* (Patient taking differently: Take 10 mg by mouth daily. ) 30 tablet 0  . fluticasone (FLONASE) 50 MCG/ACT nasal spray Place 1 spray into both nostrils daily as needed for allergies or rhinitis.    Marland Kitchen gabapentin (NEURONTIN) 100 MG capsule Take 300 mg by mouth 3 (three) times daily.     Marland Kitchen galantamine (RAZADYNE ER) 24 MG 24 hr capsule TAKE 1 CAPSULE BY MOUTH EVERY DAY (Patient taking differently: Take 24 mg by mouth daily with breakfast. ) 90 capsule 1  . hydrocortisone (PROCTOZONE-HC) 2.5 % rectal cream Place 1 application  rectally 2 (two) times daily as needed for hemorrhoids or anal itching.     . linaclotide (LINZESS) 72 MCG capsule Take 72 mcg by mouth every other day.     . Multiple Vitamin (MULTIVITAMIN WITH MINERALS) TABS tablet Take 1 tablet by mouth daily.    Marland Kitchen NAMENDA XR 28 MG CP24 24 hr capsule TAKE 1 CAPSULE(28 MG) BY MOUTH DAILY (Patient taking differently: Take 28 mg by mouth daily. ) 30 capsule 5  . nystatin cream (MYCOSTATIN) Apply 1 application topically 2 (two) times daily as needed (for yeast infection).    Marland Kitchen olmesartan (BENICAR) 20 MG tablet Take 40 mg by mouth daily.     . polyethylene glycol (MIRALAX / GLYCOLAX) 17 g packet Take 17 g by mouth daily.    . traMADol (ULTRAM) 50 MG tablet Take 50 mg by mouth in the morning, at noon, and at bedtime.      No current facility-administered medications for this visit.   Allergies: Sulfa antibiotics  No LMP recorded. Patient is postmenopausal.  Past medical history,surgical history, problem list, medications, allergies, family history and social history were all reviewed and documented as reviewed in the EPIC chart.  ROS:  Feeling well. No dyspnea or chest pain on exertion.  No abdominal pain, change in bowel habits, black or bloody stools.  No urinary tract symptoms. GYN ROS: As described in HPI   OBJECTIVE:  BP 110/64 (BP Location:  Right Arm, Patient Position: Sitting, Cuff Size: Normal)  The patient appears well, alert, oriented x 3, in no distress. PELVIC EXAM: VULVA: normal appearing vulva with atrophic changes and symmetrical blanching along the bilateral labia and perineum consistent with visual impression of lichen sclerosus, no masses, tenderness or lesions, VAGINA: normal appearing vagina with atrophic change, normal color and clear thin discharge, no lesions, CERVIX: Limited view due to patient intolerance of deep speculum insertion, no evidence of bleeding or abnormal discharge, WET MOUNT done - results: +hyphae  Chaperone: KimAlexis  Bonham present during the examination  ASSESSMENT:  85 y.o. [id number] here for evaluation of vaginal discharge with findings of vaginal candidiasis, visual suspicion of lichen sclerosus  PLAN:  As she is relatively asymptomatic at this time and finishing treatment for bacterial vaginosis, I recommended that she look out for symptoms of vaginal yeast including itching and irritation and if the symptoms develop she might want to try a 7 night course of Monistat intravaginal cream rather than doing the oral fluconazole again.  We also discussed the findings that were suggestive of lichen sclerosus, but as this is asymptomatic right now, no treatment is needed.  We discussed possible need for topical corticosteroids in the future if symptoms do develop from the LS.  All questions were answered by the end of the visit.  Follow-up as needed, otherwise would recommend annual exam of the pelvic area for follow-up of LS.   Margaret Pierini MD 12/31/19

## 2019-12-31 NOTE — Addendum Note (Signed)
Addended by: Lorine Bears on: 12/31/2019 04:10 PM   Modules accepted: Orders

## 2020-01-29 DIAGNOSIS — Z23 Encounter for immunization: Secondary | ICD-10-CM | POA: Diagnosis not present

## 2020-03-06 DIAGNOSIS — G894 Chronic pain syndrome: Secondary | ICD-10-CM | POA: Diagnosis not present

## 2020-03-06 DIAGNOSIS — F0280 Dementia in other diseases classified elsewhere without behavioral disturbance: Secondary | ICD-10-CM | POA: Diagnosis not present

## 2020-03-06 DIAGNOSIS — I10 Essential (primary) hypertension: Secondary | ICD-10-CM | POA: Diagnosis not present

## 2020-03-06 DIAGNOSIS — M5416 Radiculopathy, lumbar region: Secondary | ICD-10-CM | POA: Diagnosis not present

## 2020-03-06 DIAGNOSIS — S00411A Abrasion of right ear, initial encounter: Secondary | ICD-10-CM | POA: Diagnosis not present

## 2020-03-11 DIAGNOSIS — Z23 Encounter for immunization: Secondary | ICD-10-CM | POA: Diagnosis not present

## 2020-04-06 DIAGNOSIS — G309 Alzheimer's disease, unspecified: Secondary | ICD-10-CM | POA: Diagnosis not present

## 2020-04-06 DIAGNOSIS — M810 Age-related osteoporosis without current pathological fracture: Secondary | ICD-10-CM | POA: Diagnosis not present

## 2020-04-06 DIAGNOSIS — L570 Actinic keratosis: Secondary | ICD-10-CM | POA: Diagnosis not present

## 2020-04-06 DIAGNOSIS — G894 Chronic pain syndrome: Secondary | ICD-10-CM | POA: Diagnosis not present

## 2020-04-06 DIAGNOSIS — M5416 Radiculopathy, lumbar region: Secondary | ICD-10-CM | POA: Diagnosis not present

## 2020-04-07 DIAGNOSIS — D485 Neoplasm of uncertain behavior of skin: Secondary | ICD-10-CM | POA: Diagnosis not present

## 2020-04-07 DIAGNOSIS — C4441 Basal cell carcinoma of skin of scalp and neck: Secondary | ICD-10-CM | POA: Diagnosis not present

## 2020-04-07 DIAGNOSIS — Z85828 Personal history of other malignant neoplasm of skin: Secondary | ICD-10-CM | POA: Diagnosis not present

## 2020-05-04 DIAGNOSIS — M5416 Radiculopathy, lumbar region: Secondary | ICD-10-CM | POA: Diagnosis not present

## 2020-05-04 DIAGNOSIS — Z5181 Encounter for therapeutic drug level monitoring: Secondary | ICD-10-CM | POA: Diagnosis not present

## 2020-05-22 DIAGNOSIS — M5416 Radiculopathy, lumbar region: Secondary | ICD-10-CM | POA: Diagnosis not present

## 2020-05-22 DIAGNOSIS — G894 Chronic pain syndrome: Secondary | ICD-10-CM | POA: Diagnosis not present

## 2020-05-22 DIAGNOSIS — Z79899 Other long term (current) drug therapy: Secondary | ICD-10-CM | POA: Diagnosis not present

## 2020-05-28 DIAGNOSIS — M5416 Radiculopathy, lumbar region: Secondary | ICD-10-CM | POA: Diagnosis not present

## 2020-05-28 DIAGNOSIS — L89312 Pressure ulcer of right buttock, stage 2: Secondary | ICD-10-CM | POA: Diagnosis not present

## 2020-05-28 DIAGNOSIS — M4124 Other idiopathic scoliosis, thoracic region: Secondary | ICD-10-CM | POA: Diagnosis not present

## 2020-05-28 DIAGNOSIS — G309 Alzheimer's disease, unspecified: Secondary | ICD-10-CM | POA: Diagnosis not present

## 2020-05-28 DIAGNOSIS — M25551 Pain in right hip: Secondary | ICD-10-CM | POA: Diagnosis not present

## 2020-06-10 DIAGNOSIS — L89151 Pressure ulcer of sacral region, stage 1: Secondary | ICD-10-CM | POA: Diagnosis not present

## 2020-06-10 DIAGNOSIS — E785 Hyperlipidemia, unspecified: Secondary | ICD-10-CM | POA: Diagnosis not present

## 2020-06-10 DIAGNOSIS — D72829 Elevated white blood cell count, unspecified: Secondary | ICD-10-CM | POA: Diagnosis not present

## 2020-06-10 DIAGNOSIS — G894 Chronic pain syndrome: Secondary | ICD-10-CM | POA: Diagnosis not present

## 2020-06-10 DIAGNOSIS — G309 Alzheimer's disease, unspecified: Secondary | ICD-10-CM | POA: Diagnosis not present

## 2020-06-10 DIAGNOSIS — F3341 Major depressive disorder, recurrent, in partial remission: Secondary | ICD-10-CM | POA: Diagnosis not present

## 2020-06-10 DIAGNOSIS — I10 Essential (primary) hypertension: Secondary | ICD-10-CM | POA: Diagnosis not present

## 2020-06-12 DIAGNOSIS — Z79899 Other long term (current) drug therapy: Secondary | ICD-10-CM | POA: Diagnosis not present

## 2020-06-12 DIAGNOSIS — M5416 Radiculopathy, lumbar region: Secondary | ICD-10-CM | POA: Diagnosis not present

## 2020-06-12 DIAGNOSIS — G894 Chronic pain syndrome: Secondary | ICD-10-CM | POA: Diagnosis not present

## 2020-06-22 DIAGNOSIS — G894 Chronic pain syndrome: Secondary | ICD-10-CM | POA: Diagnosis not present

## 2020-06-22 DIAGNOSIS — F3341 Major depressive disorder, recurrent, in partial remission: Secondary | ICD-10-CM | POA: Diagnosis not present

## 2020-06-22 DIAGNOSIS — G309 Alzheimer's disease, unspecified: Secondary | ICD-10-CM | POA: Diagnosis not present

## 2020-06-22 DIAGNOSIS — M810 Age-related osteoporosis without current pathological fracture: Secondary | ICD-10-CM | POA: Diagnosis not present

## 2020-06-22 DIAGNOSIS — I10 Essential (primary) hypertension: Secondary | ICD-10-CM | POA: Diagnosis not present

## 2020-06-22 DIAGNOSIS — E785 Hyperlipidemia, unspecified: Secondary | ICD-10-CM | POA: Diagnosis not present

## 2020-06-25 DIAGNOSIS — M25571 Pain in right ankle and joints of right foot: Secondary | ICD-10-CM | POA: Diagnosis not present

## 2020-06-25 DIAGNOSIS — M79671 Pain in right foot: Secondary | ICD-10-CM | POA: Diagnosis not present

## 2020-06-25 DIAGNOSIS — S8261XA Displaced fracture of lateral malleolus of right fibula, initial encounter for closed fracture: Secondary | ICD-10-CM | POA: Diagnosis not present

## 2020-06-29 DIAGNOSIS — Z9181 History of falling: Secondary | ICD-10-CM | POA: Diagnosis not present

## 2020-06-29 DIAGNOSIS — S93402D Sprain of unspecified ligament of left ankle, subsequent encounter: Secondary | ICD-10-CM | POA: Diagnosis not present

## 2020-06-29 DIAGNOSIS — M961 Postlaminectomy syndrome, not elsewhere classified: Secondary | ICD-10-CM | POA: Diagnosis not present

## 2020-06-29 DIAGNOSIS — G309 Alzheimer's disease, unspecified: Secondary | ICD-10-CM | POA: Diagnosis not present

## 2020-06-29 DIAGNOSIS — G894 Chronic pain syndrome: Secondary | ICD-10-CM | POA: Diagnosis not present

## 2020-06-29 DIAGNOSIS — W19XXXD Unspecified fall, subsequent encounter: Secondary | ICD-10-CM | POA: Diagnosis not present

## 2020-06-29 DIAGNOSIS — Z87891 Personal history of nicotine dependence: Secondary | ICD-10-CM | POA: Diagnosis not present

## 2020-06-29 DIAGNOSIS — M5416 Radiculopathy, lumbar region: Secondary | ICD-10-CM | POA: Diagnosis not present

## 2020-06-29 DIAGNOSIS — M1990 Unspecified osteoarthritis, unspecified site: Secondary | ICD-10-CM | POA: Diagnosis not present

## 2020-06-29 DIAGNOSIS — F0280 Dementia in other diseases classified elsewhere without behavioral disturbance: Secondary | ICD-10-CM | POA: Diagnosis not present

## 2020-06-30 DIAGNOSIS — Z85828 Personal history of other malignant neoplasm of skin: Secondary | ICD-10-CM | POA: Diagnosis not present

## 2020-06-30 DIAGNOSIS — L570 Actinic keratosis: Secondary | ICD-10-CM | POA: Diagnosis not present

## 2020-06-30 DIAGNOSIS — L814 Other melanin hyperpigmentation: Secondary | ICD-10-CM | POA: Diagnosis not present

## 2020-06-30 DIAGNOSIS — L821 Other seborrheic keratosis: Secondary | ICD-10-CM | POA: Diagnosis not present

## 2020-07-02 DIAGNOSIS — M25571 Pain in right ankle and joints of right foot: Secondary | ICD-10-CM | POA: Diagnosis not present

## 2020-07-23 DIAGNOSIS — G309 Alzheimer's disease, unspecified: Secondary | ICD-10-CM | POA: Diagnosis not present

## 2020-07-23 DIAGNOSIS — F3341 Major depressive disorder, recurrent, in partial remission: Secondary | ICD-10-CM | POA: Diagnosis not present

## 2020-07-23 DIAGNOSIS — I10 Essential (primary) hypertension: Secondary | ICD-10-CM | POA: Diagnosis not present

## 2020-07-23 DIAGNOSIS — E785 Hyperlipidemia, unspecified: Secondary | ICD-10-CM | POA: Diagnosis not present

## 2020-08-17 DIAGNOSIS — Z1231 Encounter for screening mammogram for malignant neoplasm of breast: Secondary | ICD-10-CM | POA: Diagnosis not present

## 2020-08-28 DIAGNOSIS — Z5181 Encounter for therapeutic drug level monitoring: Secondary | ICD-10-CM | POA: Diagnosis not present

## 2020-08-28 DIAGNOSIS — G894 Chronic pain syndrome: Secondary | ICD-10-CM | POA: Diagnosis not present

## 2020-08-28 DIAGNOSIS — M5416 Radiculopathy, lumbar region: Secondary | ICD-10-CM | POA: Diagnosis not present

## 2020-08-28 DIAGNOSIS — Z79899 Other long term (current) drug therapy: Secondary | ICD-10-CM | POA: Diagnosis not present

## 2020-10-27 DIAGNOSIS — Z20822 Contact with and (suspected) exposure to covid-19: Secondary | ICD-10-CM | POA: Diagnosis not present

## 2020-11-16 ENCOUNTER — Encounter (HOSPITAL_COMMUNITY): Payer: Self-pay | Admitting: Emergency Medicine

## 2020-11-16 ENCOUNTER — Emergency Department (HOSPITAL_COMMUNITY): Payer: Medicare Other

## 2020-11-16 ENCOUNTER — Emergency Department (HOSPITAL_COMMUNITY)
Admission: EM | Admit: 2020-11-16 | Discharge: 2020-11-17 | Disposition: A | Discharge status: Home / Self Care | Payer: Medicare Other | Attending: Emergency Medicine | Admitting: Emergency Medicine

## 2020-11-16 ENCOUNTER — Other Ambulatory Visit: Payer: Self-pay

## 2020-11-16 DIAGNOSIS — R5383 Other fatigue: Secondary | ICD-10-CM | POA: Diagnosis not present

## 2020-11-16 DIAGNOSIS — K5730 Diverticulosis of large intestine without perforation or abscess without bleeding: Secondary | ICD-10-CM | POA: Diagnosis not present

## 2020-11-16 DIAGNOSIS — F0390 Unspecified dementia without behavioral disturbance: Secondary | ICD-10-CM | POA: Diagnosis not present

## 2020-11-16 DIAGNOSIS — R404 Transient alteration of awareness: Secondary | ICD-10-CM | POA: Diagnosis not present

## 2020-11-16 DIAGNOSIS — R4182 Altered mental status, unspecified: Secondary | ICD-10-CM | POA: Diagnosis not present

## 2020-11-16 DIAGNOSIS — Z96642 Presence of left artificial hip joint: Secondary | ICD-10-CM | POA: Insufficient documentation

## 2020-11-16 DIAGNOSIS — Z20822 Contact with and (suspected) exposure to covid-19: Secondary | ICD-10-CM | POA: Insufficient documentation

## 2020-11-16 DIAGNOSIS — I10 Essential (primary) hypertension: Secondary | ICD-10-CM | POA: Diagnosis not present

## 2020-11-16 DIAGNOSIS — R531 Weakness: Secondary | ICD-10-CM | POA: Diagnosis not present

## 2020-11-16 DIAGNOSIS — Z87891 Personal history of nicotine dependence: Secondary | ICD-10-CM | POA: Diagnosis not present

## 2020-11-16 DIAGNOSIS — J329 Chronic sinusitis, unspecified: Secondary | ICD-10-CM

## 2020-11-16 DIAGNOSIS — N281 Cyst of kidney, acquired: Secondary | ICD-10-CM | POA: Diagnosis not present

## 2020-11-16 DIAGNOSIS — R509 Fever, unspecified: Secondary | ICD-10-CM | POA: Diagnosis not present

## 2020-11-16 DIAGNOSIS — I959 Hypotension, unspecified: Secondary | ICD-10-CM | POA: Diagnosis not present

## 2020-11-16 DIAGNOSIS — Z79899 Other long term (current) drug therapy: Secondary | ICD-10-CM | POA: Diagnosis not present

## 2020-11-16 DIAGNOSIS — R0902 Hypoxemia: Secondary | ICD-10-CM | POA: Diagnosis not present

## 2020-11-16 DIAGNOSIS — R8290 Unspecified abnormal findings in urine: Secondary | ICD-10-CM | POA: Diagnosis not present

## 2020-11-16 DIAGNOSIS — R410 Disorientation, unspecified: Secondary | ICD-10-CM | POA: Diagnosis not present

## 2020-11-16 DIAGNOSIS — J3489 Other specified disorders of nose and nasal sinuses: Secondary | ICD-10-CM | POA: Diagnosis not present

## 2020-11-16 DIAGNOSIS — R000 Tachycardia, unspecified: Secondary | ICD-10-CM | POA: Diagnosis not present

## 2020-11-16 DIAGNOSIS — R42 Dizziness and giddiness: Secondary | ICD-10-CM | POA: Diagnosis not present

## 2020-11-16 LAB — CBC WITH DIFFERENTIAL/PLATELET
Abs Immature Granulocytes: 0.04 10*3/uL (ref 0.00–0.07)
Basophils Absolute: 0.1 10*3/uL (ref 0.0–0.1)
Basophils Relative: 1 %
Eosinophils Absolute: 0 10*3/uL (ref 0.0–0.5)
Eosinophils Relative: 0 %
HCT: 41.8 % (ref 36.0–46.0)
Hemoglobin: 14.1 g/dL (ref 12.0–15.0)
Immature Granulocytes: 0 %
Lymphocytes Relative: 11 %
Lymphs Abs: 1 10*3/uL (ref 0.7–4.0)
MCH: 32.9 pg (ref 26.0–34.0)
MCHC: 33.7 g/dL (ref 30.0–36.0)
MCV: 97.4 fL (ref 80.0–100.0)
Monocytes Absolute: 0.7 10*3/uL (ref 0.1–1.0)
Monocytes Relative: 7 %
Neutro Abs: 8 10*3/uL — ABNORMAL HIGH (ref 1.7–7.7)
Neutrophils Relative %: 81 %
Platelets: 275 10*3/uL (ref 150–400)
RBC: 4.29 MIL/uL (ref 3.87–5.11)
RDW: 13.2 % (ref 11.5–15.5)
WBC: 9.8 10*3/uL (ref 4.0–10.5)
nRBC: 0 % (ref 0.0–0.2)

## 2020-11-16 LAB — COMPREHENSIVE METABOLIC PANEL
ALT: 22 U/L (ref 0–44)
AST: 25 U/L (ref 15–41)
Albumin: 3.8 g/dL (ref 3.5–5.0)
Alkaline Phosphatase: 48 U/L (ref 38–126)
Anion gap: 10 (ref 5–15)
BUN: 10 mg/dL (ref 8–23)
CO2: 23 mmol/L (ref 22–32)
Calcium: 9 mg/dL (ref 8.9–10.3)
Chloride: 102 mmol/L (ref 98–111)
Creatinine, Ser: 0.53 mg/dL (ref 0.44–1.00)
GFR, Estimated: >60 mL/min (ref >60)
Glucose, Bld: 117 mg/dL — ABNORMAL HIGH (ref 70–99)
Potassium: 3.4 mmol/L — ABNORMAL LOW (ref 3.5–5.1)
Sodium: 135 mmol/L (ref 135–145)
Total Bilirubin: 0.8 mg/dL (ref 0.3–1.2)
Total Protein: 6.7 g/dL (ref 6.5–8.1)

## 2020-11-16 LAB — RESP PANEL BY RT-PCR (FLU A&B, COVID) ARPGX2
Influenza A by PCR: NEGATIVE
Influenza B by PCR: NEGATIVE
SARS Coronavirus 2 by RT PCR: NEGATIVE

## 2020-11-16 LAB — URINALYSIS, ROUTINE W REFLEX MICROSCOPIC
Bilirubin Urine: NEGATIVE
Glucose, UA: NEGATIVE mg/dL
Ketones, ur: 20 mg/dL — AB
Leukocytes,Ua: NEGATIVE
Nitrite: NEGATIVE
Protein, ur: 30 mg/dL — AB
Specific Gravity, Urine: 1.013 (ref 1.005–1.030)
pH: 8 (ref 5.0–8.0)

## 2020-11-16 LAB — LACTIC ACID, PLASMA
Lactic Acid, Venous: 1.1 mmol/L (ref 0.5–1.9)
Lactic Acid, Venous: 1.2 mmol/L (ref 0.5–1.9)

## 2020-11-16 MED ORDER — ACETAMINOPHEN 500 MG PO TABS: 1000.0000 mg | ORAL_TABLET | Freq: Once | ORAL | Status: DC

## 2020-11-16 MED ORDER — SODIUM CHLORIDE 0.9 % IV BOLUS (SEPSIS)
1000.0000 mL | Freq: Once | INTRAVENOUS | Status: AC
  Administered 2020-11-16: 1000 mL via INTRAVENOUS

## 2020-11-16 MED ORDER — ACETAMINOPHEN 160 MG/5ML PO SOLN
650.0000 mg | Freq: Once | ORAL | Status: AC
  Administered 2020-11-16: 650 mg via ORAL
  Filled 2020-11-16: qty 20.3

## 2020-11-16 MED ORDER — IOHEXOL 300 MG/ML  SOLN
100.0000 mL | Freq: Once | INTRAMUSCULAR | Status: AC | PRN
  Administered 2020-11-16: 100 mL via INTRAVENOUS

## 2020-11-16 MED ORDER — PIPERACILLIN-TAZOBACTAM 3.375 G IVPB 30 MIN
3.3750 g | Freq: Once | INTRAVENOUS | Status: AC
  Administered 2020-11-16: 3.375 g via INTRAVENOUS
  Filled 2020-11-16: qty 50

## 2020-11-16 MED ORDER — IBUPROFEN 100 MG/5ML PO SUSP
600.0000 mg | Freq: Once | ORAL | Status: AC
  Administered 2020-11-16: 600 mg via ORAL
  Filled 2020-11-16: qty 30

## 2020-11-16 NOTE — ED Triage Notes (Signed)
Pt arrives via EMS from PCP with reports of weakness and AMS. Caregiver reports fever since last night. Pt alert to self and weak all over. Hx of dementia.

## 2020-11-16 NOTE — ED Notes (Signed)
Returned from ct 

## 2020-11-16 NOTE — ED Notes (Signed)
Provider at bedside with caretaker

## 2020-11-16 NOTE — ED Notes (Signed)
Patient transported to CT 

## 2020-11-16 NOTE — ED Provider Notes (Signed)
[facility] EMERGENCY DEPARTMENT Provider Note   CSN: [id number] Arrival date & time: 11/16/20  1712     History Chief Complaint  Patient presents with   Altered Mental Status   Weakness    Margaret [name] is a 86 y.o. female hx of HTN, HL, here presenting with weakness and confusion.  Patient apparently was running a fever since yesterday.  Patient went to doctor's office and had her appointment around 3 PM.  She was noted to be weak and after the appointment she was noted to be altered.  Patient was noted to be weak all over.  Patient was also noted to be febrile 101 by EMS.  Patient unable to give much history due to altered mental status and dementia.  The history is provided by the patient and the EMS personnel.  Level V caveat- dementia      Past Medical History:  Diagnosis Date   Arthritis    Disturbances of sensation of smell and taste    Essential hypertension    a.  02/2007 Echo: Nl EF, triv AI.   GERD (gastroesophageal reflux disease)    Lichen sclerosus    Lipoma of face    Memory loss    Mixed hyperlipidemia    Osteopenia    Other persistent mental disorders due to conditions classified elsewhere    Tubular adenoma of colon 2017    Patient Active Problem List   Diagnosis Date Noted   S/P left THA, AA 09/01/2015   Mixed hyperlipidemia    RUQ abdominal pain 02/23/2015   Nausea without vomiting 02/23/2015   Elevated LFTs 02/23/2015   Loss of appetite 02/23/2015   Dementia with behavioral disturbance ([place]) 02/04/2015   Abdominal pain 02/04/2015   Leaking of urine 02/04/2015   Dyspepsia 07/17/2013   Mild cognitive impairment 09/21/2012   Poison ivy dermatitis 08/27/2012   Osteopenia    Lichen sclerosus    ARM PAIN 06/30/2010   Hyperlipidemia 07/19/2008   Essential hypertension 07/19/2008   BRUIT 07/19/2008    Past Surgical History:  Procedure Laterality Date   CATARACT EXTRACTION, BILATERAL     OOPHORECTOMY  1988   DIAG LAP  W BSO   TONSILECTOMY, ADENOIDECTOMY, BILATERAL MYRINGOTOMY AND TUBES     TOTAL HIP ARTHROPLASTY Left 09/01/2015   Procedure: LEFT TOTAL HIP ARTHROPLASTY ANTERIOR APPROACH;  Surgeon: Paralee Cancel, MD;  Location: [facility];  Service: Orthopedics;  Laterality: Left;     OB History     Gravida  4   Para  3   Term      Preterm      AB  1   Living  3      SAB      IAB      Ectopic      Multiple      Live Births              Family History  Problem Relation Age of Onset   Cancer Mother        OVARIAN   Diabetes Father    Hypertension Father     Social History   Tobacco Use   Smoking status: Former    Types: Cigarettes    Quit date: 09/13/2002    Years since quitting: 18.1   Smokeless tobacco: Never  Vaping Use   Vaping Use: Never used  Substance Use Topics   Alcohol use: Yes    Alcohol/week: 21.0 standard drinks    Types: 14  Glasses of wine, 7 Standard drinks or equivalent per week    Comment: 2 glasses of wine each evening   Drug use: No    Home Medications Prior to Admission medications   Medication Sig Start Date End Date Taking? Authorizing Provider  acetaminophen (TYLENOL 8 HOUR) 650 MG CR tablet Take 1,300 mg by mouth every 8 (eight) hours as needed for pain.     [provider]  amLODipine (NORVASC) 5 MG tablet Take 1 tablet by mouth daily. 11/26/19   [provider]  Cholecalciferol (VITAMIN D3) 50 MCG (2000 UT) TABS Take 2,000 Units by mouth daily.    [provider]  diclofenac sodium (VOLTAREN) 1 % GEL Apply 1 application topically 3 (three) times daily as needed (for back pain).    [provider]  docusate sodium (COLACE) 50 MG capsule Take 50 mg by mouth daily.    [provider]  escitalopram (LEXAPRO) 5 MG tablet TAKE 1 TABLET BY MOUTH DAILY*CALL OFFICE TO MAKE APPOINTMENT* Patient taking differently: Take 10 mg by mouth daily.  05/29/15   Reed, Tiffany L, DO  fluticasone (FLONASE) 50 MCG/ACT nasal  spray Place 1 spray into both nostrils daily as needed for allergies or rhinitis.    [provider]  gabapentin (NEURONTIN) 100 MG capsule Take 300 mg by mouth 3 (three) times daily.     [provider]  galantamine (RAZADYNE ER) 24 MG 24 hr capsule TAKE 1 CAPSULE BY MOUTH EVERY DAY Patient taking differently: Take 24 mg by mouth daily with breakfast.  01/05/15   Reed, Tiffany L, DO  hydrocortisone (PROCTOZONE-HC) 2.5 % rectal cream Place 1 application rectally 2 (two) times daily as needed for hemorrhoids or anal itching.     [provider]  linaclotide (LINZESS) 72 MCG capsule Take 72 mcg by mouth every other day.     [provider]  Multiple Vitamin (MULTIVITAMIN WITH MINERALS) TABS tablet Take 1 tablet by mouth daily.    [provider]  NAMENDA XR 28 MG CP24 24 hr capsule TAKE 1 CAPSULE(28 MG) BY MOUTH DAILY Patient taking differently: Take 28 mg by mouth daily.  01/28/15   Reed, Tiffany L, DO  nystatin cream (MYCOSTATIN) Apply 1 application topically 2 (two) times daily as needed (for yeast infection).    [provider]  olmesartan (BENICAR) 20 MG tablet Take 40 mg by mouth daily.     [provider]  polyethylene glycol (MIRALAX / GLYCOLAX) 17 g packet Take 17 g by mouth daily.    [provider]  traMADol (ULTRAM) 50 MG tablet Take 50 mg by mouth in the morning, at noon, and at bedtime.     [provider]    Allergies    Sulfa antibiotics  Review of Systems   Review of Systems  Neurological:  Positive for dizziness and weakness.  All other systems reviewed and are negative.  Physical Exam Updated Vital Signs BP 129/68   Pulse 71   Temp (!) 101.3 F (38.5 C) (Rectal)   Resp 20   Ht '4\' 11"'$  (1.499 m)   Wt 53.5 kg   SpO2 97%   BMI 23.83 kg/m   Physical Exam Vitals and nursing note reviewed.  Constitutional:      Comments: Demented and weak all over  HENT:     Head: Normocephalic.      Nose: Nose normal.     Mouth/Throat:     Mouth: Mucous membranes are dry.  Eyes:     Extraocular Movements: Extraocular movements intact.     Pupils: Pupils are equal, round, and reactive to light.  Cardiovascular:     Rate and Rhythm: Normal rate and regular rhythm.     Pulses: Normal pulses.     Heart sounds: Normal heart sounds.  Pulmonary:     Effort: Pulmonary effort is normal.     Breath sounds: Normal breath sounds.  Abdominal:     General: Abdomen is flat.     Palpations: Abdomen is soft.  Musculoskeletal:        General: Normal range of motion.     Cervical back: Normal range of motion and neck supple.  Skin:    General: Skin is warm.     Capillary Refill: Capillary refill takes less than 2 seconds.  Neurological:     General: No focal deficit present.     Comments: No obvious facial droop.  Patient's strength is 4 out of 5 bilateral arms.  Patient has 3 out of 5 bilateral leg strength.   Psychiatric:        Mood and Affect: Mood normal.        Behavior: Behavior normal.    ED Results / Procedures / Treatments   Labs (all labs ordered are listed, but only abnormal results are displayed) Labs Reviewed  COMPREHENSIVE METABOLIC PANEL - Abnormal; Notable for the following components:      Result Value   Potassium 3.4 (*)    Glucose, Bld 117 (*)    All other components within normal limits  CBC WITH DIFFERENTIAL/PLATELET - Abnormal; Notable for the following components:   Neutro Abs 8.0 (*)    All other components within normal limits  URINALYSIS, ROUTINE W REFLEX MICROSCOPIC - Abnormal; Notable for the following components:   Hgb urine dipstick MODERATE (*)    Ketones, ur 20 (*)    Protein, ur 30 (*)    Bacteria, UA RARE (*)    All other components within normal limits  RESP PANEL BY RT-PCR (FLU A&B, COVID) ARPGX2  URINE CULTURE  CULTURE, BLOOD (ROUTINE X 2)  CULTURE, BLOOD (ROUTINE X 2)  LACTIC ACID, PLASMA  LACTIC ACID, PLASMA     EKG None  Radiology DG Chest 2 View  Result Date: 11/16/2020 CLINICAL DATA:  Fever EXAM: CHEST - 2 VIEW COMPARISON:  None. FINDINGS: The heart size and mediastinal contours are within normal limits. Both lungs are clear. Bilateral shoulder degenerative changes. Scoliotic curvature with diffuse spondylosis. IMPRESSION: No focal airspace consolidation. Electronically Signed   By: Maurine Simmering   On: 11/16/2020 18:43   CT Head Wo Contrast  Result Date: 11/16/2020 CLINICAL DATA:  Dizziness, altered mental status EXAM: CT HEAD WITHOUT CONTRAST TECHNIQUE: Contiguous axial images were obtained from the base of the skull through the vertex without intravenous contrast. COMPARISON:  MR brain, 09/03/2010 FINDINGS: Brain: No evidence of acute infarction, hemorrhage, hydrocephalus, extra-axial collection or mass lesion/mass effect. Periventricular and deep white matter hypodensity. Vascular: No hyperdense vessel or unexpected calcification. Skull: Normal. Negative for fracture or focal lesion. Sinuses/Orbits: Near complete opacification of the right maxillary sinus with an air-fluid level. Other: None. IMPRESSION: 1. No acute intracranial pathology. Small-vessel white matter disease. 2. Near complete opacification of the right maxillary sinus with an air-fluid level. Correlate for sinusitis. Electronically Signed   By: Eddie Candle M.D.   On: 11/16/2020 19:44    Procedures Procedures   Medications Ordered in ED Medications  sodium chloride 0.9 %  bolus 1,000 mL (0 mLs Intravenous Stopped 11/16/20 2136)  acetaminophen (TYLENOL) 160 MG/5ML solution 650 mg (650 mg Oral Given 11/16/20 1956)  ibuprofen (ADVIL) 100 MG/5ML suspension 600 mg (600 mg Oral Given 11/16/20 2228)  piperacillin-tazobactam (ZOSYN) IVPB 3.375 g (0 g Intravenous Stopped 11/16/20 2229)    ED Course  I have reviewed the triage vital signs and the nursing notes.  Pertinent labs & imaging results that were available during my care of the  patient were reviewed by me and considered in my medical decision making (see chart for details).    MDM Rules/Calculators/A&P                          Margaret [name] is a 86 y.o. female here with weakness and altered mental status.  Patient is febrile 101 per EMS.  I think her confusion is likely from sepsis.  Plan to get CBC, CMP, lactate, cultures, UA, COVID test, chest x-ray.  We will get CT head to rule out bleed as well.  9 pm Patient's caregiver is at bedside.  She apparently has been trying to self disimpact herself.  Her temperature is down to 101 from 103 now.  Her mental status has improved.  CT head showed possible sinusitis.  White blood cell count and lactate is normal.  Updated daughter who lives out of town.  Her rectum appears slightly irritated.  We will get CT abdomen pelvis to rule out colitis.   11:13 PM Signed out to Dr. Stark Jock in the ED to follow up CT ab/pel. If negative, anticipate dc home with augmentin for sinusitis     Final Clinical Impression(s) / ED Diagnoses Final diagnoses:  Sinusitis, unspecified chronicity, unspecified location    Rx / DC Orders ED Discharge Orders     None        Drenda Freeze, MD 11/16/20 2313

## 2020-11-16 NOTE — ED Notes (Signed)
Received verbal report from Ginny M RN at this time 

## 2020-11-16 NOTE — ED Notes (Signed)
Caregiver states pt is a DNR however it is not here. Caregiver going to go get it at this time

## 2020-11-17 MED ORDER — AMOXICILLIN-POT CLAVULANATE 250-62.5 MG/5ML PO SUSR: 500.0000 mg | Freq: Three times a day (TID) | ORAL | 0 refills | Status: DC

## 2020-11-17 MED ORDER — AMOXICILLIN-POT CLAVULANATE 500-125 MG PO TABS
1.0000 | ORAL_TABLET | Freq: Once | ORAL | Status: DC
  Filled 2020-11-17: qty 1

## 2020-11-17 MED ORDER — AMOXICILLIN-POT CLAVULANATE 500-125 MG PO TABS: 1.0000 | ORAL_TABLET | Freq: Three times a day (TID) | ORAL | 0 refills | Status: DC

## 2020-11-17 MED ORDER — AMOXICILLIN-POT CLAVULANATE 250-62.5 MG/5ML PO SUSR: 500.0000 mg | Freq: Once | ORAL | 0 refills | Status: DC

## 2020-11-17 NOTE — ED Notes (Signed)
Found pt standing at the foot of the bed stating that she needed to go poop. Pt had removed her gown and monitor. Sheets changed pt changed and placed back in bed and placed on bedpan at this time

## 2020-11-17 NOTE — ED Notes (Signed)
Per care giver if pt Margaret [name] get up and walk then she can take her home

## 2020-11-17 NOTE — Discharge Instructions (Addendum)
Begin taking Augmentin as prescribed.  Begin taking Tylenol 650 mg rotated with ibuprofen 400 mg every 4 hours as needed for pain or fever.  Return to the emergency department for chest pain, difficulty breathing, bloody stools, or other new and concerning symptoms.

## 2020-11-17 NOTE — ED Provider Notes (Signed)
  Physical Exam  BP 107/72   Pulse 72   Temp 98.1 F (36.7 C) (Oral)   Resp 19   Ht '4\' 11"'$  (1.499 m)   Wt 53.5 kg   SpO2 96%   BMI 23.83 kg/m   Physical Exam  ED Course/Procedures     Procedures  MDM  Care assumed from Dr. Darl Householder at shift change.  Patient awaiting results of a CT scan of the abdomen and pelvis.  The study has resulted and shows no acute intra-abdominal process.  Patient initially presenting here with fever and confusion.  Her confusion has resolved as the fever has resolved.  CT scan of the head did show sinusitis and she is complaining of some congestion.  She will be treated with Augmentin for this.  Her COVID test and other tests are thus far negative.  Blood cultures are pending.  Patient does seem okay for discharge.  Her caretaker is at bedside and is comfortable with the disposition.       Veryl Speak, MD 11/17/20 0010

## 2020-11-21 LAB — CULTURE, BLOOD (ROUTINE X 2)
Culture: NO GROWTH
Culture: NO GROWTH
Special Requests: ADEQUATE

## 2020-11-22 DIAGNOSIS — F3341 Major depressive disorder, recurrent, in partial remission: Secondary | ICD-10-CM | POA: Diagnosis not present

## 2020-11-22 DIAGNOSIS — E785 Hyperlipidemia, unspecified: Secondary | ICD-10-CM | POA: Diagnosis not present

## 2020-11-22 DIAGNOSIS — I10 Essential (primary) hypertension: Secondary | ICD-10-CM | POA: Diagnosis not present

## 2020-11-22 DIAGNOSIS — G309 Alzheimer's disease, unspecified: Secondary | ICD-10-CM | POA: Diagnosis not present

## 2020-11-23 DIAGNOSIS — Z1152 Encounter for screening for COVID-19: Secondary | ICD-10-CM | POA: Diagnosis not present

## 2020-11-23 DIAGNOSIS — R509 Fever, unspecified: Secondary | ICD-10-CM | POA: Diagnosis not present

## 2020-11-23 DIAGNOSIS — J0191 Acute recurrent sinusitis, unspecified: Secondary | ICD-10-CM | POA: Diagnosis not present

## 2020-11-23 DIAGNOSIS — R197 Diarrhea, unspecified: Secondary | ICD-10-CM | POA: Diagnosis not present

## 2020-11-23 DIAGNOSIS — R410 Disorientation, unspecified: Secondary | ICD-10-CM | POA: Diagnosis not present

## 2020-11-23 DIAGNOSIS — R5383 Other fatigue: Secondary | ICD-10-CM | POA: Diagnosis not present

## 2020-11-25 ENCOUNTER — Ambulatory Visit (INDEPENDENT_AMBULATORY_CARE_PROVIDER_SITE_OTHER): Payer: Medicare Other | Admitting: Obstetrics and Gynecology

## 2020-11-25 ENCOUNTER — Other Ambulatory Visit: Payer: Self-pay

## 2020-11-25 ENCOUNTER — Encounter: Payer: Self-pay | Admitting: Obstetrics and Gynecology

## 2020-11-25 VITALS — BP 124/88 | HR 66 | Resp 18

## 2020-11-25 DIAGNOSIS — N762 Acute vulvitis: Secondary | ICD-10-CM

## 2020-11-25 LAB — WET PREP FOR TRICH, YEAST, CLUE

## 2020-11-25 MED ORDER — NYSTATIN 100000 UNIT/GM EX POWD: 1.0000 "application " | Freq: Three times a day (TID) | CUTANEOUS | 2 refills | Status: DC

## 2020-11-25 MED ORDER — NYSTATIN-TRIAMCINOLONE 100000-0.1 UNIT/GM-% EX OINT: 1.0000 "application " | TOPICAL_OINTMENT | Freq: Two times a day (BID) | CUTANEOUS | 0 refills | Status: AC

## 2020-11-25 NOTE — Progress Notes (Signed)
GYNECOLOGY  VISIT   HPI: 86 y.o.   Married  Caucasian  female   [phone] with No LMP recorded. Patient is postmenopausal.   here for vaginal redness after taking abx. Patient was prescribed abx for sinus infection 9 days ago. Reports diarrhea after starting abx. PCP has prescribed diflucan, patient has not taken medication yet. Wants to confirm yeast. Used Nystatin cream externally last night, reports some relief.   Caregiver, Behanna is here today.  No discharge noted.  No vaginal odor.  No vaginal bleeding.   Now on a ZPack instead of Augmentin.   Uses Desitin and A and D ointment.  Patient is having pain with wiping externally.   She does wear Depends.   GYNECOLOGIC HISTORY: No LMP recorded. Patient is postmenopausal. Contraception: abstinence  Menopausal hormone therapy:  No Last mammogram:  Patient reports MMG at [facility] this year Last pap smear:   05/17/10, neg        OB History     Gravida  4   Para  3   Term      Preterm      AB  1   Living  3      SAB      IAB      Ectopic      Multiple      Live Births                 Patient Active Problem List   Diagnosis Date Noted   S/P left THA, AA 09/01/2015   Mixed hyperlipidemia    RUQ abdominal pain 02/23/2015   Nausea without vomiting 02/23/2015   Elevated LFTs 02/23/2015   Loss of appetite 02/23/2015   Dementia with behavioral disturbance ([place]) 02/04/2015   Abdominal pain 02/04/2015   Leaking of urine 02/04/2015   Dyspepsia 07/17/2013   Mild cognitive impairment 09/21/2012   Poison ivy dermatitis 08/27/2012   Osteopenia    Lichen sclerosus    ARM PAIN 06/30/2010   Hyperlipidemia 07/19/2008   Essential hypertension 07/19/2008   BRUIT 07/19/2008    Past Medical History:  Diagnosis Date   Arthritis    Disturbances of sensation of smell and taste    Essential hypertension    a.  02/2007 Echo: Nl EF, triv AI.   GERD (gastroesophageal reflux disease)    Lichen sclerosus    Lipoma of  face    Memory loss    Mixed hyperlipidemia    Osteopenia    Other persistent mental disorders due to conditions classified elsewhere    Tubular adenoma of colon 2017    Past Surgical History:  Procedure Laterality Date   CATARACT EXTRACTION, BILATERAL     OOPHORECTOMY  1988   DIAG LAP W BSO   TONSILECTOMY, ADENOIDECTOMY, BILATERAL MYRINGOTOMY AND TUBES     TOTAL HIP ARTHROPLASTY Left 09/01/2015   Procedure: LEFT TOTAL HIP ARTHROPLASTY ANTERIOR APPROACH;  Surgeon: Paralee Cancel, MD;  Location: [facility];  Service: Orthopedics;  Laterality: Left;    Current Outpatient Medications  Medication Sig Dispense Refill   acetaminophen (TYLENOL 8 HOUR) 650 MG CR tablet Take 1,300 mg by mouth every 8 (eight) hours as needed for pain.      amLODipine (NORVASC) 5 MG tablet Take 1 tablet by mouth daily.     amoxicillin-clavulanate (AUGMENTIN) 250-62.5 MG/5ML suspension Take 10 mLs (500 mg total) by mouth 3 (three) times daily. 210 mL 0   Cholecalciferol (VITAMIN D3) 50 MCG (2000 UT) TABS Take 2,000 Units  by mouth daily.     diclofenac sodium (VOLTAREN) 1 % GEL Apply 1 application topically 3 (three) times daily as needed (for back pain).     docusate sodium (COLACE) 50 MG capsule Take 50 mg by mouth daily.     escitalopram (LEXAPRO) 5 MG tablet TAKE 1 TABLET BY MOUTH DAILY*CALL OFFICE TO MAKE APPOINTMENT* (Patient taking differently: Take 10 mg by mouth daily. ) 30 tablet 0   fluticasone (FLONASE) 50 MCG/ACT nasal spray Place 1 spray into both nostrils daily as needed for allergies or rhinitis.     gabapentin (NEURONTIN) 100 MG capsule Take 300 mg by mouth 3 (three) times daily.      galantamine (RAZADYNE ER) 24 MG 24 hr capsule TAKE 1 CAPSULE BY MOUTH EVERY DAY (Patient taking differently: Take 24 mg by mouth daily with breakfast. ) 90 capsule 1   hydrocortisone (PROCTOZONE-HC) 2.5 % rectal cream Place 1 application rectally 2 (two) times daily as needed for hemorrhoids or anal itching.      linaclotide  (LINZESS) 72 MCG capsule Take 72 mcg by mouth every other day.      Multiple Vitamin (MULTIVITAMIN WITH MINERALS) TABS tablet Take 1 tablet by mouth daily.     NAMENDA XR 28 MG CP24 24 hr capsule TAKE 1 CAPSULE(28 MG) BY MOUTH DAILY (Patient taking differently: Take 28 mg by mouth daily. ) 30 capsule 5   nystatin cream (MYCOSTATIN) Apply 1 application topically 2 (two) times daily as needed (for yeast infection).     olmesartan (BENICAR) 20 MG tablet Take 40 mg by mouth daily.      polyethylene glycol (MIRALAX / GLYCOLAX) 17 g packet Take 17 g by mouth daily.     traMADol (ULTRAM) 50 MG tablet Take 50 mg by mouth in the morning, at noon, and at bedtime.      No current facility-administered medications for this visit.     ALLERGIES: Sulfa antibiotics  Family History  Problem Relation Age of Onset   Cancer Mother        OVARIAN   Diabetes Father    Hypertension Father     Social History   Socioeconomic History   Marital status: Married    Spouse name: Not on file   Number of children: 3   Years of education: Not on file   Highest education level: Not on file  Occupational History   Occupation: retired travel agent    Comment: full time  Tobacco Use   Smoking status: Former    Types: Cigarettes    Quit date: 09/13/2002    Years since quitting: 18.2   Smokeless tobacco: Never  Vaping Use   Vaping Use: Never used  Substance and Sexual Activity   Alcohol use: Yes    Alcohol/week: 21.0 standard drinks    Types: 14 Glasses of wine, 7 Standard drinks or equivalent per week    Comment: 2 glasses of wine each evening   Drug use: No   Sexual activity: Not Currently    Birth control/protection: Abstinence  Other Topics Concern   Not on file  Social History Narrative   This patient is widowed. She lives alone  in a single level  home in [place], [place] to assist with meals and transportation.    Patient is retired Network engineer.    Main exercise is walking, she has a Programmer, systems.   Stopped smoking 20 years ago, drinks 1-2 glasses of wine a  day   Patient has 2 sons and one daughter, all live out of town.   Has a Living Will, DO NOT RESUSCITATE.   Social Determinants of Health   Financial Resource Strain: Not on file  Food Insecurity: Not on file  Transportation Needs: Not on file  Physical Activity: Not on file  Stress: Not on file  Social Connections: Not on file  Intimate Partner Violence: Not on file    Review of Systems  Genitourinary:        Vaginal irritation and burning   PHYSICAL EXAMINATION:    BP 124/88 (BP Location: Left Arm, Patient Position: Sitting)   Pulse 66   Resp 18     General appearance: alert, cooperative and appears stated age  Pelvic: External genitalia:  erythema of vulva and exudate.  Flaking of the vulvar skin.               Urethra:  normal appearing urethra with no masses, tenderness or lesions              Bartholins and Skenes: normal                 Vagina: normal appearing vagina with normal color and discharge, no lesions              Cervix: no lesions                Bimanual Exam:  Uterus:  normal size, contour, position, consistency, mobility, non-tender              Adnexa: no mass, fullness, tenderness              Rectal exam: stool staining of anus.  Chaperone was present for exam:  Sharee Pimple, RN.   ASSESSMENT  Vulvitis.  Looks like Candida and inflammation from diarrhea and contact dermatitis.   PLAN  Wet prep:  neg yeast, trich, and clue cells.  Mycolog II bid for one week.  Take the Diflucan already prescribed.  Nystatin powder tid prn. This can be used daily for pericare along with Desitin.  Fu prn.    An After Visit Summary was printed and given to the patient.  33 min total time was spent for this patient encounter, including preparation, face-to-face counseling with the patient, coordination of care, and documentation of the encounter.

## 2020-11-27 DIAGNOSIS — Z79899 Other long term (current) drug therapy: Secondary | ICD-10-CM | POA: Diagnosis not present

## 2020-11-27 DIAGNOSIS — G894 Chronic pain syndrome: Secondary | ICD-10-CM | POA: Diagnosis not present

## 2020-11-27 DIAGNOSIS — Z5181 Encounter for therapeutic drug level monitoring: Secondary | ICD-10-CM | POA: Diagnosis not present

## 2020-12-11 DIAGNOSIS — G894 Chronic pain syndrome: Secondary | ICD-10-CM | POA: Diagnosis not present

## 2020-12-11 DIAGNOSIS — Z79899 Other long term (current) drug therapy: Secondary | ICD-10-CM | POA: Diagnosis not present

## 2020-12-11 DIAGNOSIS — M5416 Radiculopathy, lumbar region: Secondary | ICD-10-CM | POA: Diagnosis not present

## 2020-12-28 DIAGNOSIS — Z20822 Contact with and (suspected) exposure to covid-19: Secondary | ICD-10-CM | POA: Diagnosis not present

## 2021-01-08 DIAGNOSIS — M5416 Radiculopathy, lumbar region: Secondary | ICD-10-CM | POA: Diagnosis not present

## 2021-01-08 DIAGNOSIS — G894 Chronic pain syndrome: Secondary | ICD-10-CM | POA: Diagnosis not present

## 2021-01-08 DIAGNOSIS — Z79899 Other long term (current) drug therapy: Secondary | ICD-10-CM | POA: Diagnosis not present

## 2021-01-11 DIAGNOSIS — D3141 Benign neoplasm of right ciliary body: Secondary | ICD-10-CM | POA: Diagnosis not present

## 2021-01-11 DIAGNOSIS — Z961 Presence of intraocular lens: Secondary | ICD-10-CM | POA: Diagnosis not present

## 2021-01-11 DIAGNOSIS — H25012 Cortical age-related cataract, left eye: Secondary | ICD-10-CM | POA: Diagnosis not present

## 2021-01-11 DIAGNOSIS — H2512 Age-related nuclear cataract, left eye: Secondary | ICD-10-CM | POA: Diagnosis not present

## 2021-01-13 DIAGNOSIS — E559 Vitamin D deficiency, unspecified: Secondary | ICD-10-CM | POA: Diagnosis not present

## 2021-01-13 DIAGNOSIS — E785 Hyperlipidemia, unspecified: Secondary | ICD-10-CM | POA: Diagnosis not present

## 2021-01-19 DIAGNOSIS — Z23 Encounter for immunization: Secondary | ICD-10-CM | POA: Diagnosis not present

## 2021-01-19 DIAGNOSIS — M810 Age-related osteoporosis without current pathological fracture: Secondary | ICD-10-CM | POA: Diagnosis not present

## 2021-01-19 DIAGNOSIS — F3341 Major depressive disorder, recurrent, in partial remission: Secondary | ICD-10-CM | POA: Diagnosis not present

## 2021-01-19 DIAGNOSIS — F0280 Dementia in other diseases classified elsewhere without behavioral disturbance: Secondary | ICD-10-CM | POA: Diagnosis not present

## 2021-01-19 DIAGNOSIS — G309 Alzheimer's disease, unspecified: Secondary | ICD-10-CM | POA: Diagnosis not present

## 2021-01-19 DIAGNOSIS — I10 Essential (primary) hypertension: Secondary | ICD-10-CM | POA: Diagnosis not present

## 2021-01-19 DIAGNOSIS — R82998 Other abnormal findings in urine: Secondary | ICD-10-CM | POA: Diagnosis not present

## 2021-01-19 DIAGNOSIS — E785 Hyperlipidemia, unspecified: Secondary | ICD-10-CM | POA: Diagnosis not present

## 2021-01-19 DIAGNOSIS — G894 Chronic pain syndrome: Secondary | ICD-10-CM | POA: Diagnosis not present

## 2021-01-19 DIAGNOSIS — Z0000 Encounter for general adult medical examination without abnormal findings: Secondary | ICD-10-CM | POA: Diagnosis not present

## 2021-02-04 DIAGNOSIS — Z23 Encounter for immunization: Secondary | ICD-10-CM | POA: Diagnosis not present

## 2021-02-22 DIAGNOSIS — E785 Hyperlipidemia, unspecified: Secondary | ICD-10-CM | POA: Diagnosis not present

## 2021-02-22 DIAGNOSIS — G309 Alzheimer's disease, unspecified: Secondary | ICD-10-CM | POA: Diagnosis not present

## 2021-02-22 DIAGNOSIS — F3341 Major depressive disorder, recurrent, in partial remission: Secondary | ICD-10-CM | POA: Diagnosis not present

## 2021-02-22 DIAGNOSIS — I10 Essential (primary) hypertension: Secondary | ICD-10-CM | POA: Diagnosis not present

## 2021-04-09 DIAGNOSIS — M5416 Radiculopathy, lumbar region: Secondary | ICD-10-CM | POA: Diagnosis not present

## 2021-04-09 DIAGNOSIS — Z79899 Other long term (current) drug therapy: Secondary | ICD-10-CM | POA: Diagnosis not present

## 2021-04-09 DIAGNOSIS — G894 Chronic pain syndrome: Secondary | ICD-10-CM | POA: Diagnosis not present

## 2021-04-25 DIAGNOSIS — Z20822 Contact with and (suspected) exposure to covid-19: Secondary | ICD-10-CM | POA: Diagnosis not present

## 2021-06-14 DIAGNOSIS — Z79899 Other long term (current) drug therapy: Secondary | ICD-10-CM | POA: Diagnosis not present

## 2021-06-30 DIAGNOSIS — Z85828 Personal history of other malignant neoplasm of skin: Secondary | ICD-10-CM | POA: Diagnosis not present

## 2021-06-30 DIAGNOSIS — L821 Other seborrheic keratosis: Secondary | ICD-10-CM | POA: Diagnosis not present

## 2021-06-30 DIAGNOSIS — C44519 Basal cell carcinoma of skin of other part of trunk: Secondary | ICD-10-CM | POA: Diagnosis not present

## 2021-06-30 DIAGNOSIS — L814 Other melanin hyperpigmentation: Secondary | ICD-10-CM | POA: Diagnosis not present

## 2021-06-30 DIAGNOSIS — D485 Neoplasm of uncertain behavior of skin: Secondary | ICD-10-CM | POA: Diagnosis not present

## 2021-06-30 DIAGNOSIS — L218 Other seborrheic dermatitis: Secondary | ICD-10-CM | POA: Diagnosis not present

## 2021-06-30 DIAGNOSIS — D2261 Melanocytic nevi of right upper limb, including shoulder: Secondary | ICD-10-CM | POA: Diagnosis not present

## 2021-07-02 DIAGNOSIS — M5416 Radiculopathy, lumbar region: Secondary | ICD-10-CM | POA: Diagnosis not present

## 2021-07-02 DIAGNOSIS — G894 Chronic pain syndrome: Secondary | ICD-10-CM | POA: Diagnosis not present

## 2021-07-02 DIAGNOSIS — Z79899 Other long term (current) drug therapy: Secondary | ICD-10-CM | POA: Diagnosis not present

## 2021-07-02 DIAGNOSIS — Z20822 Contact with and (suspected) exposure to covid-19: Secondary | ICD-10-CM | POA: Diagnosis not present

## 2021-07-15 DIAGNOSIS — I10 Essential (primary) hypertension: Secondary | ICD-10-CM | POA: Diagnosis not present

## 2021-07-15 DIAGNOSIS — G250 Essential tremor: Secondary | ICD-10-CM | POA: Diagnosis not present

## 2021-07-15 DIAGNOSIS — G309 Alzheimer's disease, unspecified: Secondary | ICD-10-CM | POA: Diagnosis not present

## 2021-07-15 DIAGNOSIS — F0280 Dementia in other diseases classified elsewhere without behavioral disturbance: Secondary | ICD-10-CM | POA: Diagnosis not present

## 2021-07-15 DIAGNOSIS — E785 Hyperlipidemia, unspecified: Secondary | ICD-10-CM | POA: Diagnosis not present

## 2021-07-15 DIAGNOSIS — G894 Chronic pain syndrome: Secondary | ICD-10-CM | POA: Diagnosis not present

## 2021-07-15 DIAGNOSIS — F3341 Major depressive disorder, recurrent, in partial remission: Secondary | ICD-10-CM | POA: Diagnosis not present

## 2021-07-15 DIAGNOSIS — M810 Age-related osteoporosis without current pathological fracture: Secondary | ICD-10-CM | POA: Diagnosis not present

## 2021-07-21 DIAGNOSIS — C44519 Basal cell carcinoma of skin of other part of trunk: Secondary | ICD-10-CM | POA: Diagnosis not present

## 2021-08-17 DIAGNOSIS — Z20822 Contact with and (suspected) exposure to covid-19: Secondary | ICD-10-CM | POA: Diagnosis not present

## 2021-08-20 DIAGNOSIS — M48061 Spinal stenosis, lumbar region without neurogenic claudication: Secondary | ICD-10-CM | POA: Diagnosis not present

## 2021-08-20 DIAGNOSIS — Z79899 Other long term (current) drug therapy: Secondary | ICD-10-CM | POA: Diagnosis not present

## 2021-08-20 DIAGNOSIS — G894 Chronic pain syndrome: Secondary | ICD-10-CM | POA: Diagnosis not present

## 2021-08-20 DIAGNOSIS — M5416 Radiculopathy, lumbar region: Secondary | ICD-10-CM | POA: Diagnosis not present

## 2021-08-26 DIAGNOSIS — M25561 Pain in right knee: Secondary | ICD-10-CM | POA: Diagnosis not present

## 2021-08-26 DIAGNOSIS — M79671 Pain in right foot: Secondary | ICD-10-CM | POA: Diagnosis not present

## 2021-08-27 DIAGNOSIS — Z20822 Contact with and (suspected) exposure to covid-19: Secondary | ICD-10-CM | POA: Diagnosis not present

## 2021-08-31 DIAGNOSIS — Z5181 Encounter for therapeutic drug level monitoring: Secondary | ICD-10-CM | POA: Diagnosis not present

## 2021-08-31 DIAGNOSIS — Z79899 Other long term (current) drug therapy: Secondary | ICD-10-CM | POA: Diagnosis not present

## 2021-09-09 DIAGNOSIS — Z1231 Encounter for screening mammogram for malignant neoplasm of breast: Secondary | ICD-10-CM | POA: Diagnosis not present

## 2021-09-17 DIAGNOSIS — Z79899 Other long term (current) drug therapy: Secondary | ICD-10-CM | POA: Diagnosis not present

## 2021-09-17 DIAGNOSIS — G894 Chronic pain syndrome: Secondary | ICD-10-CM | POA: Diagnosis not present

## 2021-09-17 DIAGNOSIS — M5416 Radiculopathy, lumbar region: Secondary | ICD-10-CM | POA: Diagnosis not present

## 2021-09-17 DIAGNOSIS — M48061 Spinal stenosis, lumbar region without neurogenic claudication: Secondary | ICD-10-CM | POA: Diagnosis not present

## 2021-10-08 DIAGNOSIS — M5416 Radiculopathy, lumbar region: Secondary | ICD-10-CM | POA: Diagnosis not present

## 2021-10-08 DIAGNOSIS — Z79899 Other long term (current) drug therapy: Secondary | ICD-10-CM | POA: Diagnosis not present

## 2021-10-08 DIAGNOSIS — M48061 Spinal stenosis, lumbar region without neurogenic claudication: Secondary | ICD-10-CM | POA: Diagnosis not present

## 2021-10-08 DIAGNOSIS — G894 Chronic pain syndrome: Secondary | ICD-10-CM | POA: Diagnosis not present

## 2021-10-14 DIAGNOSIS — E559 Vitamin D deficiency, unspecified: Secondary | ICD-10-CM | POA: Diagnosis not present

## 2021-10-14 DIAGNOSIS — M810 Age-related osteoporosis without current pathological fracture: Secondary | ICD-10-CM | POA: Diagnosis not present

## 2021-10-14 DIAGNOSIS — I10 Essential (primary) hypertension: Secondary | ICD-10-CM | POA: Diagnosis not present

## 2021-10-27 ENCOUNTER — Other Ambulatory Visit (HOSPITAL_COMMUNITY): Payer: Self-pay | Admitting: *Deleted

## 2021-10-28 ENCOUNTER — Ambulatory Visit (HOSPITAL_COMMUNITY)
Admission: RE | Admit: 2021-10-28 | Discharge: 2021-10-28 | Disposition: A | Discharge status: Home / Self Care | Payer: Medicare Other | Source: Ambulatory Visit | Attending: Internal Medicine | Admitting: Internal Medicine

## 2021-10-28 DIAGNOSIS — M810 Age-related osteoporosis without current pathological fracture: Secondary | ICD-10-CM | POA: Diagnosis not present

## 2021-10-28 MED ORDER — DENOSUMAB 60 MG/ML ~~LOC~~ SOSY
60.0000 mg | PREFILLED_SYRINGE | Freq: Once | SUBCUTANEOUS | Status: AC
  Administered 2021-10-28: 60 mg via SUBCUTANEOUS

## 2021-10-28 MED ORDER — DENOSUMAB 60 MG/ML ~~LOC~~ SOSY
PREFILLED_SYRINGE | SUBCUTANEOUS | Status: AC
  Filled 2021-10-28: qty 1

## 2021-11-09 DIAGNOSIS — I10 Essential (primary) hypertension: Secondary | ICD-10-CM | POA: Diagnosis not present

## 2021-11-09 DIAGNOSIS — G894 Chronic pain syndrome: Secondary | ICD-10-CM | POA: Diagnosis not present

## 2021-11-09 DIAGNOSIS — F0280 Dementia in other diseases classified elsewhere without behavioral disturbance: Secondary | ICD-10-CM | POA: Diagnosis not present

## 2021-11-09 DIAGNOSIS — R21 Rash and other nonspecific skin eruption: Secondary | ICD-10-CM | POA: Diagnosis not present

## 2021-11-09 DIAGNOSIS — M810 Age-related osteoporosis without current pathological fracture: Secondary | ICD-10-CM | POA: Diagnosis not present

## 2021-11-09 DIAGNOSIS — E559 Vitamin D deficiency, unspecified: Secondary | ICD-10-CM | POA: Diagnosis not present

## 2021-11-09 DIAGNOSIS — G309 Alzheimer's disease, unspecified: Secondary | ICD-10-CM | POA: Diagnosis not present

## 2021-11-19 DIAGNOSIS — M5416 Radiculopathy, lumbar region: Secondary | ICD-10-CM | POA: Diagnosis not present

## 2021-11-19 DIAGNOSIS — M48061 Spinal stenosis, lumbar region without neurogenic claudication: Secondary | ICD-10-CM | POA: Diagnosis not present

## 2021-11-19 DIAGNOSIS — G894 Chronic pain syndrome: Secondary | ICD-10-CM | POA: Diagnosis not present

## 2021-11-19 DIAGNOSIS — Z79899 Other long term (current) drug therapy: Secondary | ICD-10-CM | POA: Diagnosis not present

## 2021-11-22 DIAGNOSIS — M5416 Radiculopathy, lumbar region: Secondary | ICD-10-CM | POA: Diagnosis not present

## 2021-11-22 DIAGNOSIS — M7918 Myalgia, other site: Secondary | ICD-10-CM | POA: Diagnosis not present

## 2021-11-22 DIAGNOSIS — G894 Chronic pain syndrome: Secondary | ICD-10-CM | POA: Diagnosis not present

## 2021-11-22 DIAGNOSIS — Z79899 Other long term (current) drug therapy: Secondary | ICD-10-CM | POA: Diagnosis not present

## 2022-01-03 DIAGNOSIS — L89312 Pressure ulcer of right buttock, stage 2: Secondary | ICD-10-CM | POA: Diagnosis not present

## 2022-01-19 DIAGNOSIS — Z23 Encounter for immunization: Secondary | ICD-10-CM | POA: Diagnosis not present

## 2022-01-25 DIAGNOSIS — M5416 Radiculopathy, lumbar region: Secondary | ICD-10-CM | POA: Diagnosis not present

## 2022-01-25 DIAGNOSIS — G894 Chronic pain syndrome: Secondary | ICD-10-CM | POA: Diagnosis not present

## 2022-01-25 DIAGNOSIS — Z79899 Other long term (current) drug therapy: Secondary | ICD-10-CM | POA: Diagnosis not present

## 2022-01-25 DIAGNOSIS — M7918 Myalgia, other site: Secondary | ICD-10-CM | POA: Diagnosis not present

## 2022-01-25 DIAGNOSIS — M48061 Spinal stenosis, lumbar region without neurogenic claudication: Secondary | ICD-10-CM | POA: Diagnosis not present

## 2022-02-03 DIAGNOSIS — H10503 Unspecified blepharoconjunctivitis, bilateral: Secondary | ICD-10-CM | POA: Diagnosis not present

## 2022-02-03 DIAGNOSIS — H25812 Combined forms of age-related cataract, left eye: Secondary | ICD-10-CM | POA: Diagnosis not present

## 2022-02-03 DIAGNOSIS — D3141 Benign neoplasm of right ciliary body: Secondary | ICD-10-CM | POA: Diagnosis not present

## 2022-02-03 DIAGNOSIS — Z961 Presence of intraocular lens: Secondary | ICD-10-CM | POA: Diagnosis not present

## 2022-02-10 DIAGNOSIS — Z23 Encounter for immunization: Secondary | ICD-10-CM | POA: Diagnosis not present

## 2022-02-11 DIAGNOSIS — N390 Urinary tract infection, site not specified: Secondary | ICD-10-CM | POA: Diagnosis not present

## 2022-03-09 DIAGNOSIS — E785 Hyperlipidemia, unspecified: Secondary | ICD-10-CM | POA: Diagnosis not present

## 2022-03-09 DIAGNOSIS — E559 Vitamin D deficiency, unspecified: Secondary | ICD-10-CM | POA: Diagnosis not present

## 2022-03-09 DIAGNOSIS — R7989 Other specified abnormal findings of blood chemistry: Secondary | ICD-10-CM | POA: Diagnosis not present

## 2022-03-09 DIAGNOSIS — I10 Essential (primary) hypertension: Secondary | ICD-10-CM | POA: Diagnosis not present

## 2022-03-15 DIAGNOSIS — G250 Essential tremor: Secondary | ICD-10-CM | POA: Diagnosis not present

## 2022-03-15 DIAGNOSIS — Z1339 Encounter for screening examination for other mental health and behavioral disorders: Secondary | ICD-10-CM | POA: Diagnosis not present

## 2022-03-15 DIAGNOSIS — Z1331 Encounter for screening for depression: Secondary | ICD-10-CM | POA: Diagnosis not present

## 2022-03-15 DIAGNOSIS — I10 Essential (primary) hypertension: Secondary | ICD-10-CM | POA: Diagnosis not present

## 2022-03-15 DIAGNOSIS — M810 Age-related osteoporosis without current pathological fracture: Secondary | ICD-10-CM | POA: Diagnosis not present

## 2022-03-15 DIAGNOSIS — R82998 Other abnormal findings in urine: Secondary | ICD-10-CM | POA: Diagnosis not present

## 2022-03-15 DIAGNOSIS — F0280 Dementia in other diseases classified elsewhere without behavioral disturbance: Secondary | ICD-10-CM | POA: Diagnosis not present

## 2022-03-15 DIAGNOSIS — Z0000 Encounter for general adult medical examination without abnormal findings: Secondary | ICD-10-CM | POA: Diagnosis not present

## 2022-03-15 DIAGNOSIS — G309 Alzheimer's disease, unspecified: Secondary | ICD-10-CM | POA: Diagnosis not present

## 2022-03-15 DIAGNOSIS — F3341 Major depressive disorder, recurrent, in partial remission: Secondary | ICD-10-CM | POA: Diagnosis not present

## 2022-03-15 DIAGNOSIS — G894 Chronic pain syndrome: Secondary | ICD-10-CM | POA: Diagnosis not present

## 2022-03-15 DIAGNOSIS — E785 Hyperlipidemia, unspecified: Secondary | ICD-10-CM | POA: Diagnosis not present

## 2022-05-30 DIAGNOSIS — N390 Urinary tract infection, site not specified: Secondary | ICD-10-CM | POA: Diagnosis not present

## 2022-06-08 DIAGNOSIS — Z1152 Encounter for screening for COVID-19: Secondary | ICD-10-CM | POA: Diagnosis not present

## 2022-06-08 DIAGNOSIS — G309 Alzheimer's disease, unspecified: Secondary | ICD-10-CM | POA: Diagnosis not present

## 2022-06-08 DIAGNOSIS — J069 Acute upper respiratory infection, unspecified: Secondary | ICD-10-CM | POA: Diagnosis not present

## 2022-06-08 DIAGNOSIS — R051 Acute cough: Secondary | ICD-10-CM | POA: Diagnosis not present

## 2022-06-08 DIAGNOSIS — R0981 Nasal congestion: Secondary | ICD-10-CM | POA: Diagnosis not present

## 2022-06-08 DIAGNOSIS — R5383 Other fatigue: Secondary | ICD-10-CM | POA: Diagnosis not present

## 2022-06-08 DIAGNOSIS — N390 Urinary tract infection, site not specified: Secondary | ICD-10-CM | POA: Diagnosis not present

## 2022-06-23 NOTE — Progress Notes (Signed)
GYNECOLOGY  VISIT   HPI: 88 y.o.   Married  Caucasian  female   [phone] with No LMP recorded. Patient is postmenopausal.   here for  yeast. Pt had two different dosages of antibiotics for a UTI. UTI has subsided but Pt has itchiness and redness around the clitoris.  Caregiver is present for the entire visit today.   One of the care giver noticed some blood with wiping near the clitoral region and noticed some redness in the clitoral region.   Using Mycolog ointment twice daily for 4 days.   Used Diflucan 150 mg every 2 days x 3 doses.   Uses an absorbant pad.   GYNECOLOGIC HISTORY: No LMP recorded. Patient is postmenopausal. Contraception:  PMP Menopausal hormone therapy:  n/a Last mammogram:  2023 per caregiver Last pap smear:   05/17/10 neg        OB History     Gravida  4   Para  3   Term      Preterm      AB  1   Living  3      SAB      IAB      Ectopic      Multiple      Live Births                 Patient Active Problem List   Diagnosis Date Noted   Pain in right foot 06/25/2020   Lumbar radiculopathy 05/04/2020   Chronic low back pain 08/16/2017   Degeneration of lumbar intervertebral disc 08/16/2017   Pain in right knee 06/26/2017   S/P left THA, AA 09/01/2015   Mixed hyperlipidemia    RUQ abdominal pain 02/23/2015   Nausea without vomiting 02/23/2015   Elevated LFTs 02/23/2015   Loss of appetite 02/23/2015   Dementia with behavioral disturbance ([place]) 02/04/2015   Abdominal pain 02/04/2015   Leaking of urine 02/04/2015   Dyspepsia 07/17/2013   Mild cognitive impairment 09/21/2012   Poison ivy dermatitis 08/27/2012   Osteopenia    Lichen sclerosus    ARM PAIN 06/30/2010   Hyperlipidemia 07/19/2008   Essential hypertension 07/19/2008   BRUIT 07/19/2008    Past Medical History:  Diagnosis Date   Arthritis    Disturbances of sensation of smell and taste    Essential hypertension    a.  02/2007 Echo: Nl EF, triv AI.   GERD  (gastroesophageal reflux disease)    Lichen sclerosus    Lipoma of face    Memory loss    Mixed hyperlipidemia    Osteopenia    Other persistent mental disorders due to conditions classified elsewhere    Tubular adenoma of colon 2017    Past Surgical History:  Procedure Laterality Date   CATARACT EXTRACTION, BILATERAL     OOPHORECTOMY  1988   DIAG LAP W BSO   TONSILECTOMY, ADENOIDECTOMY, BILATERAL MYRINGOTOMY AND TUBES     TOTAL HIP ARTHROPLASTY Left 09/01/2015   Procedure: LEFT TOTAL HIP ARTHROPLASTY ANTERIOR APPROACH;  Surgeon: Paralee Cancel, MD;  Location: [facility];  Service: Orthopedics;  Laterality: Left;    Current Outpatient Medications  Medication Sig Dispense Refill   acetaminophen (TYLENOL 8 HOUR) 650 MG CR tablet Take 1,300 mg by mouth every 8 (eight) hours as needed for pain.      amLODipine (NORVASC) 5 MG tablet Take 1 tablet by mouth daily.     buprenorphine (BUTRANS) 20 MCG/HR PTWK 1 patch every 7 days for 84  days. Indications: severe chronic pain with opioid tolerance     Cholecalciferol (VITAMIN D3) 50 MCG (2000 UT) TABS Take 2,000 Units by mouth daily.     diclofenac sodium (VOLTAREN) 1 % GEL Apply 1 application topically 3 (three) times daily as needed (for back pain).     docusate sodium (COLACE) 50 MG capsule Take 50 mg by mouth daily.     escitalopram (LEXAPRO) 5 MG tablet TAKE 1 TABLET BY MOUTH DAILY*CALL OFFICE TO MAKE APPOINTMENT* (Patient taking differently: Take 10 mg by mouth daily.) 30 tablet 0   fluticasone (FLONASE) 50 MCG/ACT nasal spray Place 1 spray into both nostrils daily as needed for allergies or rhinitis.     galantamine (RAZADYNE ER) 24 MG 24 hr capsule TAKE 1 CAPSULE BY MOUTH EVERY DAY (Patient taking differently: Take 24 mg by mouth daily with breakfast.) 90 capsule 1   linaclotide (LINZESS) 72 MCG capsule Take 72 mcg by mouth every other day.      Multiple Vitamin (MULTIVITAMIN WITH MINERALS) TABS tablet Take 1 tablet by mouth daily.     NAMENDA  XR 28 MG CP24 24 hr capsule TAKE 1 CAPSULE(28 MG) BY MOUTH DAILY (Patient taking differently: Take 28 mg by mouth daily.) 30 capsule 5   nystatin (MYCOSTATIN/NYSTOP) powder Apply 1 application topically 3 (three) times daily. Apply to affected area for up to 7 days 30 g 2   nystatin cream (MYCOSTATIN) Apply 1 application topically 2 (two) times daily as needed (for yeast infection).     nystatin-triamcinolone ointment (MYCOLOG) Apply 1 application topically 2 (two) times daily. Apply BID for up to 7 days. 60 g 0   olmesartan (BENICAR) 20 MG tablet Take 40 mg by mouth daily.      polyethylene glycol (MIRALAX / GLYCOLAX) 17 g packet Take 17 g by mouth daily.     pregabalin (LYRICA) 50 MG capsule Take by mouth.     No current facility-administered medications for this visit.     ALLERGIES: Sulfa antibiotics  Family History  Problem Relation Age of Onset   Cancer Mother        OVARIAN   Diabetes Father    Hypertension Father     Social History   Socioeconomic History   Marital status: Married    Spouse name: Not on file   Number of children: 3   Years of education: Not on file   Highest education level: Not on file  Occupational History   Occupation: retired travel agent    Comment: full time  Tobacco Use   Smoking status: Former    Types: Cigarettes    Quit date: 09/13/2002    Years since quitting: 19.8   Smokeless tobacco: Never  Vaping Use   Vaping Use: Never used  Substance and Sexual Activity   Alcohol use: Yes    Alcohol/week: 21.0 standard drinks of alcohol    Types: 14 Glasses of wine, 7 Standard drinks or equivalent per week    Comment: 2 glasses of wine each evening   Drug use: No   Sexual activity: Not Currently    Birth control/protection: Abstinence  Other Topics Concern   Not on file  Social History Narrative   This patient is widowed. She lives alone  in a single level  home in [place], [place] from [place] to  assist with meals and transportation.    Patient is retired Garment/textile technologist.    Main exercise is walking, she has a pet  dog.   Stopped smoking 20 years ago, drinks 1-2 glasses of wine a  day   Patient has 2 sons and one daughter, all live out of town.   Has a Living Will, DO NOT RESUSCITATE.   Social Determinants of Health   Financial Resource Strain: Not on file  Food Insecurity: Not on file  Transportation Needs: Not on file  Physical Activity: Not on file  Stress: Not on file  Social Connections: Not on file  Intimate Partner Violence: Not on file    Review of Systems  Genitourinary:  Positive for vaginal pain.  All other systems reviewed and are negative.   PHYSICAL EXAMINATION:    BP 106/70 (BP Location: Right Arm, Patient Position: Sitting, Cuff Size: Normal)   Pulse 62   SpO2 90%     General appearance: alert, cooperative and appears stated age   Pelvic: External genitalia:  hypopigmentation of the clitoral region. Thickening of tissue above clitoris.               Urethra:  normal appearing urethra with no masses, tenderness or lesions              Bartholins and Skenes: normal                 Vagina: normal appearing vagina with normal color and discharge, no lesions              Cervix: no lesions                Bimanual Exam:  Uterus:  normal size, contour, position, consistency, mobility, non-tender              Adnexa: no mass, fullness, tenderness         Chaperone was present for exam:  Raquel Sarna  ASSESSMENT  Vulvovaginitis.  I suspect lichen sclerosus.   PLAN  Wet prep:  negative for yeast, BV, and trichomonas.  We discussed lichen sclerosus.  Rx for Valisone ointment.  Place on affected area twice daily for 2 weeks and then place on the area at bedtime twice weekly.   FU in 1 month for a recheck and possible vulvar biopsy.    An After Visit Summary was printed and given to the patient.

## 2022-06-27 ENCOUNTER — Ambulatory Visit (INDEPENDENT_AMBULATORY_CARE_PROVIDER_SITE_OTHER): Payer: Medicare Other | Admitting: Obstetrics and Gynecology

## 2022-06-27 ENCOUNTER — Encounter: Payer: Self-pay | Admitting: Obstetrics and Gynecology

## 2022-06-27 VITALS — BP 106/70 | HR 62

## 2022-06-27 DIAGNOSIS — N760 Acute vaginitis: Secondary | ICD-10-CM | POA: Diagnosis not present

## 2022-06-27 LAB — WET PREP FOR TRICH, YEAST, CLUE

## 2022-06-27 MED ORDER — BETAMETHASONE VALERATE 0.1 % EX OINT: 1.0000 | TOPICAL_OINTMENT | Freq: Two times a day (BID) | CUTANEOUS | 0 refills | Status: DC

## 2022-06-27 NOTE — Patient Instructions (Signed)
Lichen Sclerosus Lichen sclerosus is a skin problem. It can happen on any part of the body, but it commonly involves the anal and genital areas. It can cause itching and discomfort in these areas. Treatment can help to control symptoms. When the genital area is affected, getting treatment is important because the condition can cause scarring that may lead to other problems if left untreated. What are the causes? The cause of this condition is not known. It may be related to an overactive immune system or a lack of certain hormones. Lichen sclerosus is not an infection or a fungus, and it is not passed from one person to another (non-contagious). What increases the risk? The following factors may make you more likely to develop this condition: You are a woman who has reached menopause. You are a man who was not circumcised. This condition may also develop for the first time in children, usually before they enter puberty. What are the signs or symptoms? Symptoms of this condition include: White areas (plaques) on the skin that may be thin and wrinkled, or thickened. Red and swollen patches (lesions) on the skin. Tears or cracks in the skin. Bruising. Blood blisters. Severe itching. Pain, itching, or burning when urinating. Constipation is also common in children with lichen sclerosus, but can be seen in adults. How is this diagnosed? This condition may be diagnosed with a physical exam. In some cases, a tissue sample may be removed to be checked under a microscope (biopsy). How is this treated? This condition may be treated with: Topical steroids. These are medicated creams or ointments that are applied over the affected areas. Medicines that are taken by mouth. Topical immunotherapy. These are medicated creams or ointments that are applied over the affected areas. They stimulate your immune system to fight the skin condition. This may be used if steroids are not effective. Surgery. This may  be needed in more severe cases that are causing problems such as scarring. Follow these instructions at home: Medicines Take over-the-counter and prescription medicines only as told by your health care provider. Use creams or ointments as told by your health care provider. Skin care Do not scratch the affected areas of skin. If you are a woman, be sure to keep the vaginal area as clean and dry as possible. Clean the affected area of skin gently with water only. Pat skin dry and avoid the use of rough towels or toilet paper. Avoid irritating skin products, including soap and scented lotions. Use emollient creams as directed by your health care provider to help reduce itching. General instructions Keep all follow-up visits. This is important. Your condition may cause constipation. To prevent or treat constipation, you may need to: Drink enough fluid to keep your urine pale yellow. Take over-the-counter or prescription medicines. Eat foods that are high in fiber, such as beans, whole grains, and fresh fruits and vegetables. Limit foods that are high in fat and processed sugars, such as fried or sweet foods. Contact a health care provider if: You have increasing redness, swelling, or pain in the affected area. You have fluid, blood, or pus coming from the affected area. You have new lesions on your skin. You have a fever. You have pain during sex. Get help right away if: You develop severe pain or burning in the affected areas, especially in the genital area. Summary Lichen sclerosus is a skin problem. When the genital area is affected, getting treatment is important because the condition can cause scarring that may  lead to other problems if left untreated. This condition is usually treated with medicated creams or ointments (topical steroids) that are applied over the affected areas. Take or use over-the-counter and prescription medicines only as told by your health care provider. Contact a  health care provider if you have new lesions on your skin, have pain during sex, or have increasing redness, swelling, or pain in the affected area. Keep all follow-up visits. This is important. This information is not intended to replace advice given to you by your health care provider. Make sure you discuss any questions you have with your health care provider. Document Revised: 08/24/2019 Document Reviewed: 08/24/2019 Elsevier Patient Education  [place].

## 2022-06-30 ENCOUNTER — Telehealth: Payer: Self-pay

## 2022-06-30 NOTE — Telephone Encounter (Signed)
Patient's caregiver, Leda Gauze, called to ask if when Dr. Quincy Simmonds put 2 weeks on the Valisone if she meant for her to use it 10 days or 14 days.  I advised her to apply twice daily to affected area for 14 days and then bedtime twice weekly per instruction in chart.

## 2022-07-13 NOTE — Progress Notes (Signed)
GYNECOLOGY  VISIT   HPI: 88 y.o.   Married  Caucasian  female   [phone] with No LMP recorded. Patient is postmenopausal.   here for   4 week recheck and vulvar bx.  Patient was seen on 06/27/22 for vulvovaginitis and suspected lichen sclerosus.  She was prescribed Valisone ointment.  Her caregiver things she is improved.  No further bleeding.   Tolerates lidocaine injections.  She cannot tolerate lidocaine patches which cause skin reaction.   Her caregiver is asking for a refill of nystatin powder, which patient is using in the genital area.   GYNECOLOGIC HISTORY: No LMP recorded. Patient is postmenopausal. Contraception:  PMP Menopausal hormone therapy:  n/a Last mammogram:  2023 per caregiver Last pap smear:   05/18/19 neg        OB History     Gravida  4   Para  3   Term      Preterm      AB  1   Living  3      SAB      IAB      Ectopic      Multiple      Live Births                 Patient Active Problem List   Diagnosis Date Noted   Pain in right foot 06/25/2020   Lumbar radiculopathy 05/04/2020   Chronic low back pain 08/16/2017   Degeneration of lumbar intervertebral disc 08/16/2017   Pain in right knee 06/26/2017   S/P left THA, AA 09/01/2015   Mixed hyperlipidemia    RUQ abdominal pain 02/23/2015   Nausea without vomiting 02/23/2015   Elevated LFTs 02/23/2015   Loss of appetite 02/23/2015   Dementia with behavioral disturbance 02/04/2015   Abdominal pain 02/04/2015   Leaking of urine 02/04/2015   Dyspepsia 07/17/2013   Mild cognitive impairment 09/21/2012   Poison ivy dermatitis 08/27/2012   Osteopenia    Lichen sclerosus    ARM PAIN 06/30/2010   Hyperlipidemia 07/19/2008   Essential hypertension 07/19/2008   BRUIT 07/19/2008    Past Medical History:  Diagnosis Date   Arthritis    Disturbances of sensation of smell and taste    Essential hypertension    a.  02/2007 Echo: Nl EF, triv AI.   GERD (gastroesophageal reflux  disease)    Lichen sclerosus    Lipoma of face    Memory loss    Mixed hyperlipidemia    Osteopenia    Other persistent mental disorders due to conditions classified elsewhere    Tubular adenoma of colon 2017    Past Surgical History:  Procedure Laterality Date   CATARACT EXTRACTION, BILATERAL     OOPHORECTOMY  1988   DIAG LAP W BSO   TONSILECTOMY, ADENOIDECTOMY, BILATERAL MYRINGOTOMY AND TUBES     TOTAL HIP ARTHROPLASTY Left 09/01/2015   Procedure: LEFT TOTAL HIP ARTHROPLASTY ANTERIOR APPROACH;  Surgeon: Paralee Cancel, MD;  Location: [facility];  Service: Orthopedics;  Laterality: Left;    Current Outpatient Medications  Medication Sig Dispense Refill   acetaminophen (TYLENOL 8 HOUR) 650 MG CR tablet Take 1,300 mg by mouth every 8 (eight) hours as needed for pain.      amLODipine (NORVASC) 5 MG tablet Take 1 tablet by mouth daily.     betamethasone valerate ointment (VALISONE) 0.1 % Apply 1 Application topically 2 (two) times daily. Place in a thin layer to skin twice daily for 2 weeks  and then use twice a week at bedtime. 45 g 0   buprenorphine (BUTRANS) 20 MCG/HR PTWK 1 patch every 7 days for 84 days. Indications: severe chronic pain with opioid tolerance     Cholecalciferol (VITAMIN D3) 50 MCG (2000 UT) TABS Take 2,000 Units by mouth daily.     diclofenac sodium (VOLTAREN) 1 % GEL Apply 1 application topically 3 (three) times daily as needed (for back pain).     docusate sodium (COLACE) 50 MG capsule Take 50 mg by mouth daily.     escitalopram (LEXAPRO) 5 MG tablet TAKE 1 TABLET BY MOUTH DAILY*CALL OFFICE TO MAKE APPOINTMENT* (Patient taking differently: Take 10 mg by mouth daily.) 30 tablet 0   fluticasone (FLONASE) 50 MCG/ACT nasal spray Place 1 spray into both nostrils daily as needed for allergies or rhinitis.     galantamine (RAZADYNE ER) 24 MG 24 hr capsule TAKE 1 CAPSULE BY MOUTH EVERY DAY (Patient taking differently: Take 24 mg by mouth daily with breakfast.) 90 capsule 1    linaclotide (LINZESS) 72 MCG capsule Take 72 mcg by mouth every other day.      Multiple Vitamin (MULTIVITAMIN WITH MINERALS) TABS tablet Take 1 tablet by mouth daily.     NAMENDA XR 28 MG CP24 24 hr capsule TAKE 1 CAPSULE(28 MG) BY MOUTH DAILY (Patient taking differently: Take 28 mg by mouth daily.) 30 capsule 5   nystatin (MYCOSTATIN/NYSTOP) powder Apply 1 application topically 3 (three) times daily. Apply to affected area for up to 7 days 30 g 2   nystatin cream (MYCOSTATIN) Apply 1 application topically 2 (two) times daily as needed (for yeast infection).     nystatin-triamcinolone ointment (MYCOLOG) Apply 1 application topically 2 (two) times daily. Apply BID for up to 7 days. 60 g 0   olmesartan (BENICAR) 20 MG tablet Take 40 mg by mouth daily.      polyethylene glycol (MIRALAX / GLYCOLAX) 17 g packet Take 17 g by mouth daily.     pregabalin (LYRICA) 50 MG capsule Take by mouth.     No current facility-administered medications for this visit.     ALLERGIES: Sulfa antibiotics, Denosumab, and Lidocaine  Family History  Problem Relation Age of Onset   Cancer Mother        OVARIAN   Diabetes Father    Hypertension Father     Social History   Socioeconomic History   Marital status: Married    Spouse name: Not on file   Number of children: 3   Years of education: Not on file   Highest education level: Not on file  Occupational History   Occupation: retired travel agent    Comment: full time  Tobacco Use   Smoking status: Former    Types: Cigarettes    Quit date: 09/13/2002    Years since quitting: 19.8   Smokeless tobacco: Never  Vaping Use   Vaping Use: Never used  Substance and Sexual Activity   Alcohol use: Yes    Alcohol/week: 21.0 standard drinks of alcohol    Types: 14 Glasses of wine, 7 Standard drinks or equivalent per week    Comment: 2 glasses of wine each evening   Drug use: No   Sexual activity: Not Currently    Birth control/protection: Abstinence  Other  Topics Concern   Not on file  Social History Narrative   This patient is widowed. She lives alone  in a single level  home in [place], [place] [facility] from  [employer] [facility] to assist with meals and transportation.    Patient is retired Garment/textile technologist.    Main exercise is walking, she has a Programmer, systems.   Stopped smoking 20 years ago, drinks 1-2 glasses of wine a  day   Patient has 2 sons and one daughter, all live out of town.   Has a Living Will, DO NOT RESUSCITATE.   Social Determinants of Health   Financial Resource Strain: Not on file  Food Insecurity: Not on file  Transportation Needs: Not on file  Physical Activity: Not on file  Stress: Not on file  Social Connections: Not on file  Intimate Partner Violence: Not on file    Review of Systems  All other systems reviewed and are negative.   PHYSICAL EXAMINATION:    BP 126/84 (BP Location: Right Arm, Patient Position: Sitting, Cuff Size: Normal)   Pulse 61   SpO2 95%     General appearance: alert, cooperative and appears stated age    Pelvic: External genitalia:  hypopigmentation of the superior labia minora and majora and clitoral region.  Coalescing labia minor and majora in the ipsilateral sides.  No thickening of the skin.  No lesions noted.               Urethra:  normal appearing urethra with no masses, tenderness or lesions              Chaperone was present for exam:  Oneal Deputy, CMA  ASSESSMENT  Vulvitis.  I suspect lichen sclerosus.  Allergy to lidocaine.  Encounter for medication monitoring.   PLAN  No biopsy needed.   Continue valisone ointment in a thin layer twice weekly.   Rx for nystatin powder.  Fu in 6 months and prn.    24 min  total time was spent for this patient encounter, including preparation, face-to-face counseling with the patient, coordination of care, and documentation of the encounter.

## 2022-07-27 ENCOUNTER — Ambulatory Visit (INDEPENDENT_AMBULATORY_CARE_PROVIDER_SITE_OTHER): Payer: Medicare Other | Admitting: Obstetrics and Gynecology

## 2022-07-27 ENCOUNTER — Encounter: Payer: Self-pay | Admitting: Obstetrics and Gynecology

## 2022-07-27 VITALS — BP 126/84 | HR 61

## 2022-07-27 DIAGNOSIS — Z5181 Encounter for therapeutic drug level monitoring: Secondary | ICD-10-CM

## 2022-07-27 DIAGNOSIS — N763 Subacute and chronic vulvitis: Secondary | ICD-10-CM

## 2022-07-27 MED ORDER — NYSTATIN 100000 UNIT/GM EX POWD: 1.0000 | Freq: Three times a day (TID) | CUTANEOUS | 2 refills | Status: AC

## 2022-07-28 ENCOUNTER — Telehealth: Payer: Self-pay

## 2022-07-28 DIAGNOSIS — Z79899 Other long term (current) drug therapy: Secondary | ICD-10-CM | POA: Diagnosis not present

## 2022-07-28 DIAGNOSIS — G894 Chronic pain syndrome: Secondary | ICD-10-CM | POA: Diagnosis not present

## 2022-07-28 DIAGNOSIS — M7918 Myalgia, other site: Secondary | ICD-10-CM | POA: Diagnosis not present

## 2022-07-28 DIAGNOSIS — M5416 Radiculopathy, lumbar region: Secondary | ICD-10-CM | POA: Diagnosis not present

## 2022-07-28 DIAGNOSIS — M48061 Spinal stenosis, lumbar region without neurogenic claudication: Secondary | ICD-10-CM | POA: Diagnosis not present

## 2022-07-28 NOTE — Telephone Encounter (Signed)
The patient's caregiver specifically asked for a prescription for powder.   Please inform the caregiver that the powder is not covered by the patient's insurance, and that she would need to use a cream such as Nystatin cream or Clotrimazole cream.

## 2022-07-28 NOTE — Telephone Encounter (Signed)
Inquiry from pharmacy received stating that Nystop Topical Powder is not covered by pt's insurance and is requesting that we send in a preferred alternative (Nystatin,Clotrimazole).   Please advise.

## 2022-07-28 NOTE — Telephone Encounter (Signed)
Per DPR, spoke w/ pt's caregiver Leda Gauze and informed her about the inquiry. She states that they were made aware of the denial of coverage but also were made aware of the $24.00 OOP costs and were ok w/ paying that for now.  Will route to provider for final review and close encounter.

## 2022-08-11 DIAGNOSIS — D1801 Hemangioma of skin and subcutaneous tissue: Secondary | ICD-10-CM | POA: Diagnosis not present

## 2022-08-11 DIAGNOSIS — L853 Xerosis cutis: Secondary | ICD-10-CM | POA: Diagnosis not present

## 2022-08-11 DIAGNOSIS — L570 Actinic keratosis: Secondary | ICD-10-CM | POA: Diagnosis not present

## 2022-08-11 DIAGNOSIS — Z85828 Personal history of other malignant neoplasm of skin: Secondary | ICD-10-CM | POA: Diagnosis not present

## 2022-08-11 DIAGNOSIS — L900 Lichen sclerosus et atrophicus: Secondary | ICD-10-CM | POA: Diagnosis not present

## 2022-08-11 DIAGNOSIS — D2271 Melanocytic nevi of right lower limb, including hip: Secondary | ICD-10-CM | POA: Diagnosis not present

## 2022-08-11 DIAGNOSIS — L821 Other seborrheic keratosis: Secondary | ICD-10-CM | POA: Diagnosis not present

## 2022-08-11 DIAGNOSIS — L814 Other melanin hyperpigmentation: Secondary | ICD-10-CM | POA: Diagnosis not present

## 2022-08-11 DIAGNOSIS — L718 Other rosacea: Secondary | ICD-10-CM | POA: Diagnosis not present

## 2022-09-12 DIAGNOSIS — Z1231 Encounter for screening mammogram for malignant neoplasm of breast: Secondary | ICD-10-CM | POA: Diagnosis not present

## 2022-09-13 DIAGNOSIS — M5416 Radiculopathy, lumbar region: Secondary | ICD-10-CM | POA: Diagnosis not present

## 2022-09-13 DIAGNOSIS — M48061 Spinal stenosis, lumbar region without neurogenic claudication: Secondary | ICD-10-CM | POA: Diagnosis not present

## 2022-09-13 DIAGNOSIS — Z79899 Other long term (current) drug therapy: Secondary | ICD-10-CM | POA: Diagnosis not present

## 2022-09-13 DIAGNOSIS — G894 Chronic pain syndrome: Secondary | ICD-10-CM | POA: Diagnosis not present

## 2022-09-13 DIAGNOSIS — M7918 Myalgia, other site: Secondary | ICD-10-CM | POA: Diagnosis not present

## 2022-09-15 DIAGNOSIS — F03B0 Unspecified dementia, moderate, without behavioral disturbance, psychotic disturbance, mood disturbance, and anxiety: Secondary | ICD-10-CM | POA: Diagnosis not present

## 2022-09-15 DIAGNOSIS — M810 Age-related osteoporosis without current pathological fracture: Secondary | ICD-10-CM | POA: Diagnosis not present

## 2022-09-15 DIAGNOSIS — G309 Alzheimer's disease, unspecified: Secondary | ICD-10-CM | POA: Diagnosis not present

## 2022-09-15 DIAGNOSIS — G894 Chronic pain syndrome: Secondary | ICD-10-CM | POA: Diagnosis not present

## 2022-09-15 DIAGNOSIS — F3341 Major depressive disorder, recurrent, in partial remission: Secondary | ICD-10-CM | POA: Diagnosis not present

## 2022-09-15 DIAGNOSIS — E785 Hyperlipidemia, unspecified: Secondary | ICD-10-CM | POA: Diagnosis not present

## 2022-09-15 DIAGNOSIS — I10 Essential (primary) hypertension: Secondary | ICD-10-CM | POA: Diagnosis not present

## 2022-09-29 DIAGNOSIS — R350 Frequency of micturition: Secondary | ICD-10-CM | POA: Diagnosis not present

## 2022-10-25 DIAGNOSIS — M48061 Spinal stenosis, lumbar region without neurogenic claudication: Secondary | ICD-10-CM | POA: Diagnosis not present

## 2022-10-25 DIAGNOSIS — G894 Chronic pain syndrome: Secondary | ICD-10-CM | POA: Diagnosis not present

## 2022-10-25 DIAGNOSIS — Z79899 Other long term (current) drug therapy: Secondary | ICD-10-CM | POA: Diagnosis not present

## 2022-10-25 DIAGNOSIS — M5416 Radiculopathy, lumbar region: Secondary | ICD-10-CM | POA: Diagnosis not present

## 2022-10-25 DIAGNOSIS — M7918 Myalgia, other site: Secondary | ICD-10-CM | POA: Diagnosis not present

## 2022-11-18 DIAGNOSIS — G894 Chronic pain syndrome: Secondary | ICD-10-CM | POA: Diagnosis not present

## 2022-11-18 DIAGNOSIS — B029 Zoster without complications: Secondary | ICD-10-CM | POA: Diagnosis not present

## 2022-11-18 DIAGNOSIS — G309 Alzheimer's disease, unspecified: Secondary | ICD-10-CM | POA: Diagnosis not present

## 2022-11-18 DIAGNOSIS — F03B0 Unspecified dementia, moderate, without behavioral disturbance, psychotic disturbance, mood disturbance, and anxiety: Secondary | ICD-10-CM | POA: Diagnosis not present

## 2022-11-18 DIAGNOSIS — I10 Essential (primary) hypertension: Secondary | ICD-10-CM | POA: Diagnosis not present

## 2022-12-22 NOTE — Progress Notes (Signed)
GYNECOLOGY  VISIT   HPI: 88 y.o.   Married  Caucasian  female   [phone] with No LMP recorded. Patient is postmenopausal.   here for   6 mo recheck- vulvitis. Caregiver is present today for the visit.   Using Valisone ointment twice weekly to the vulva.  Doing well with this.   Taking Macrobid for UTI through her PCP office.   Having care for back pain.   GYNECOLOGIC HISTORY: No LMP recorded. Patient is postmenopausal. Contraception:  PMP Menopausal hormone therapy:  n/a Last mammogram:  2023 per caregiver Last pap smear:   05/18/19 neg        OB History     Gravida  4   Para  3   Term      Preterm      AB  1   Living  3      SAB      IAB      Ectopic      Multiple      Live Births                 Patient Active Problem List   Diagnosis Date Noted   Shingles 01/05/2023   Pain in right foot 06/25/2020   Lumbar radiculopathy 05/04/2020   Chronic low back pain 08/16/2017   Degeneration of lumbar intervertebral disc 08/16/2017   Pain in right knee 06/26/2017   S/P left THA, AA 09/01/2015   Mixed hyperlipidemia    RUQ abdominal pain 02/23/2015   Nausea without vomiting 02/23/2015   Elevated LFTs 02/23/2015   Loss of appetite 02/23/2015   Dementia with behavioral disturbance (HCC) 02/04/2015   Abdominal pain 02/04/2015   Leaking of urine 02/04/2015   Dyspepsia 07/17/2013   Mild cognitive impairment 09/21/2012   Poison ivy dermatitis 08/27/2012   Osteopenia    Lichen sclerosus    ARM PAIN 06/30/2010   Hyperlipidemia 07/19/2008   Essential hypertension 07/19/2008   BRUIT 07/19/2008    Past Medical History:  Diagnosis Date   Arthritis    Disturbances of sensation of smell and taste    Essential hypertension    a.  02/2007 Echo: Nl EF, triv AI.   GERD (gastroesophageal reflux disease)    Lichen sclerosus    Lipoma of face    Memory loss    Mixed hyperlipidemia    Osteopenia    Other persistent mental disorders due to conditions  classified elsewhere    Tubular adenoma of colon 2017    Past Surgical History:  Procedure Laterality Date   CATARACT EXTRACTION, BILATERAL     OOPHORECTOMY  1988   DIAG LAP W BSO   TONSILECTOMY, ADENOIDECTOMY, BILATERAL MYRINGOTOMY AND TUBES     TOTAL HIP ARTHROPLASTY Left 09/01/2015   Procedure: LEFT TOTAL HIP ARTHROPLASTY ANTERIOR APPROACH;  Surgeon: Durene Romans, MD;  Location: [facility];  Service: Orthopedics;  Laterality: Left;    Current Outpatient Medications  Medication Sig Dispense Refill   acetaminophen (TYLENOL 8 HOUR) 650 MG CR tablet Take 1,300 mg by mouth every 8 (eight) hours as needed for pain.      amLODipine (NORVASC) 5 MG tablet Take 1 tablet by mouth daily.     betamethasone valerate ointment (VALISONE) 0.1 % Apply 1 Application topically 2 (two) times daily. Place in a thin layer to skin twice daily for 2 weeks and then use twice a week at bedtime. 45 g 0   Cholecalciferol (VITAMIN D3) 50 MCG (2000 UT) TABS Take  2,000 Units by mouth daily.     diclofenac sodium (VOLTAREN) 1 % GEL Apply 1 application topically 3 (three) times daily as needed (for back pain).     docusate sodium (COLACE) 50 MG capsule Take 50 mg by mouth daily.     escitalopram (LEXAPRO) 5 MG tablet TAKE 1 TABLET BY MOUTH DAILY*CALL OFFICE TO MAKE APPOINTMENT* (Patient taking differently: Take 10 mg by mouth daily.) 30 tablet 0   fluconazole (DIFLUCAN) 200 MG tablet Take 1 tablet by mouth daily.     fluticasone (FLONASE) 50 MCG/ACT nasal spray Place 1 spray into both nostrils daily as needed for allergies or rhinitis.     gabapentin (NEURONTIN) 100 MG capsule Take by mouth.     galantamine (RAZADYNE ER) 24 MG 24 hr capsule TAKE 1 CAPSULE BY MOUTH EVERY DAY (Patient taking differently: Take 24 mg by mouth daily with breakfast.) 90 capsule 1   linaclotide (LINZESS) 72 MCG capsule Take 72 mcg by mouth every other day.      MACROBID 100 MG capsule 1 capsule with food Orally twice a day x 7 for 7 days      Multiple Vitamin (MULTIVITAMIN WITH MINERALS) TABS tablet Take 1 tablet by mouth daily.     NAMENDA XR 28 MG CP24 24 hr capsule TAKE 1 CAPSULE(28 MG) BY MOUTH DAILY (Patient taking differently: Take 28 mg by mouth daily.) 30 capsule 5   nystatin (MYCOSTATIN/NYSTOP) powder Apply 1 Application topically 3 (three) times daily. Apply to affected area for up to 7 days 30 g 2   nystatin cream (MYCOSTATIN) Apply 1 application topically 2 (two) times daily as needed (for yeast infection).     nystatin-triamcinolone ointment (MYCOLOG) Apply 1 application topically 2 (two) times daily. Apply BID for up to 7 days. 60 g 0   olmesartan (BENICAR) 20 MG tablet Take 40 mg by mouth daily.      polyethylene glycol (MIRALAX / GLYCOLAX) 17 g packet Take 17 g by mouth daily.     valACYclovir (VALTREX) 1000 MG tablet Take 1,000 mg by mouth 3 (three) times daily.     pregabalin (LYRICA) 50 MG capsule Take by mouth.     No current facility-administered medications for this visit.     ALLERGIES: Sulfa antibiotics, Denosumab, and Lidocaine  Family History  Problem Relation Age of Onset   Cancer Mother        OVARIAN   Diabetes Father    Hypertension Father     Social History   Socioeconomic History   Marital status: Married    Spouse name: Not on file   Number of children: 3   Years of education: Not on file   Highest education level: Not on file  Occupational History   Occupation: retired travel agent    Comment: full time  Tobacco Use   Smoking status: Former    Current packs/day: 0.00    Types: Cigarettes    Quit date: 09/13/2002    Years since quitting: 20.3   Smokeless tobacco: Never  Vaping Use   Vaping status: Never Used  Substance and Sexual Activity   Alcohol use: Yes    Alcohol/week: 21.0 standard drinks of alcohol    Types: 14 Glasses of wine, 7 Standard drinks or equivalent per week    Comment: 2 glasses of wine each evening   Drug use: No   Sexual activity: Not Currently    Birth  control/protection: Abstinence  Other Topics Concern   Not on file  Social History Narrative   This patient is widowed. She lives alone  in a single level  home in [place], Personal Care Services from [employer] [facility] to assist with meals and transportation.    Patient is retired Firefighter.    Main exercise is walking, she has a Development worker, international aid.   Stopped smoking 20 years ago, drinks 1-2 glasses of wine a  day   Patient has 2 sons and one daughter, all live out of town.   Has a Living Will, DO NOT RESUSCITATE.   Social Determinants of Health   Financial Resource Strain: Not on file  Food Insecurity: Not on file  Transportation Needs: Not on file  Physical Activity: Not on file  Stress: Not on file  Social Connections: Not on file  Intimate Partner Violence: Not on file    Review of Systems  All other systems reviewed and are negative.   PHYSICAL EXAMINATION:    BP 126/82 (BP Location: Left Arm, Patient Position: Sitting, Cuff Size: Normal)   Pulse 64   SpO2 94%     General appearance: alert, cooperative and appears stated age   Pelvic: External genitalia:  no lesions.  Slight pale coloration to the superior labia minora and the clitoral region.  No fissures.  No ulceration to the skin.               Urethra:  normal appearing urethra with no masses, tenderness or lesions              Bartholins and Skenes: normal                 Vagina: normal appearing vagina with normal color and discharge, no lesions              Cervix: not visualized but is palpably normal.                 Bimanual Exam:  Uterus:  normal size, contour, position, consistency, mobility, non-tender              Adnexa: no mass, fullness, tenderness        Chaperone was present for exam:  Warren Lacy, CMA  ASSESSMENT  Chronic vulvitis.  Doing well with use of Valisone twice weekly.  Current UTI tx with Macrobid through PCP.  PLAN  Refill of Valisone.  Finish abx.  FU yearly and prn.    20 min  total time was spent for this patient encounter, including preparation, face-to-face counseling with the patient, coordination of care, and documentation of the encounter.

## 2023-01-03 DIAGNOSIS — N390 Urinary tract infection, site not specified: Secondary | ICD-10-CM | POA: Diagnosis not present

## 2023-01-05 ENCOUNTER — Ambulatory Visit (INDEPENDENT_AMBULATORY_CARE_PROVIDER_SITE_OTHER): Payer: Medicare Other | Admitting: Obstetrics and Gynecology

## 2023-01-05 ENCOUNTER — Encounter: Payer: Self-pay | Admitting: Obstetrics and Gynecology

## 2023-01-05 VITALS — BP 126/82 | HR 64

## 2023-01-05 DIAGNOSIS — N763 Subacute and chronic vulvitis: Secondary | ICD-10-CM

## 2023-01-05 DIAGNOSIS — B029 Zoster without complications: Secondary | ICD-10-CM | POA: Insufficient documentation

## 2023-01-05 MED ORDER — BETAMETHASONE VALERATE 0.1 % EX OINT: 1.0000 | TOPICAL_OINTMENT | Freq: Two times a day (BID) | CUTANEOUS | 0 refills | Status: DC

## 2023-01-26 DIAGNOSIS — G894 Chronic pain syndrome: Secondary | ICD-10-CM | POA: Diagnosis not present

## 2023-01-26 DIAGNOSIS — M545 Low back pain, unspecified: Secondary | ICD-10-CM | POA: Diagnosis not present

## 2023-02-14 DIAGNOSIS — Z23 Encounter for immunization: Secondary | ICD-10-CM | POA: Diagnosis not present

## 2023-02-16 DIAGNOSIS — G894 Chronic pain syndrome: Secondary | ICD-10-CM | POA: Diagnosis not present

## 2023-02-16 DIAGNOSIS — M5459 Other low back pain: Secondary | ICD-10-CM | POA: Diagnosis not present

## 2023-02-16 DIAGNOSIS — M51362 Other intervertebral disc degeneration, lumbar region with discogenic back pain and lower extremity pain: Secondary | ICD-10-CM | POA: Diagnosis not present

## 2023-02-16 DIAGNOSIS — M79605 Pain in left leg: Secondary | ICD-10-CM | POA: Diagnosis not present

## 2023-02-16 DIAGNOSIS — Z79891 Long term (current) use of opiate analgesic: Secondary | ICD-10-CM | POA: Diagnosis not present

## 2023-02-17 ENCOUNTER — Telehealth: Payer: Self-pay

## 2023-02-17 DIAGNOSIS — L0231 Cutaneous abscess of buttock: Secondary | ICD-10-CM | POA: Diagnosis not present

## 2023-02-17 DIAGNOSIS — L989 Disorder of the skin and subcutaneous tissue, unspecified: Secondary | ICD-10-CM | POA: Diagnosis not present

## 2023-02-17 NOTE — Telephone Encounter (Signed)
Per Dr. Kennith Center: "We appreciate the update. An initial evaluation with pt's PCP is appropriate. We would be happy to see patient if PCP requests or care taker would like to schedule an appointment. Dr. Edward Jolly is cc'd."

## 2023-02-17 NOTE — Telephone Encounter (Signed)
BS pt. Caregiver Jola Babinski (on DPR) LVM on triage line stating that pt has seemed to develop a sort of "boil" in coccyx area and is calling to get advice but also contacted appt desk for appt.   Spoke w/ Jola Babinski and she reports as if area is inflamed. States that the area is pea-sized, not raised, not red in color, no signs of drainage. Denies fever.  Describes the area as thickened as if almost "calloused." States  the area seems to come and go. At one point, they thought is was from the area rubbing because the pt drags her bottom. Pt did not some slight tenderness to touch. Denies a head or as if the area could be popped.  Plans to take her to PCP today @ 1030 and will call us with an update but wanted to reach out to [place] because they appreciate how we treat the pt and would still like for her to be seen by [place] prn.   Routing to provider in office to review and also pt's primary provider.   Please advise.

## 2023-02-20 NOTE — Telephone Encounter (Signed)
Encounter reviewed and I agree with the plan.  The encounter may be closed.

## 2023-03-27 DIAGNOSIS — E559 Vitamin D deficiency, unspecified: Secondary | ICD-10-CM | POA: Diagnosis not present

## 2023-03-27 DIAGNOSIS — M810 Age-related osteoporosis without current pathological fracture: Secondary | ICD-10-CM | POA: Diagnosis not present

## 2023-03-27 DIAGNOSIS — E785 Hyperlipidemia, unspecified: Secondary | ICD-10-CM | POA: Diagnosis not present

## 2023-03-27 DIAGNOSIS — I10 Essential (primary) hypertension: Secondary | ICD-10-CM | POA: Diagnosis not present

## 2023-03-29 DIAGNOSIS — Z0000 Encounter for general adult medical examination without abnormal findings: Secondary | ICD-10-CM | POA: Diagnosis not present

## 2023-03-29 DIAGNOSIS — Z1339 Encounter for screening examination for other mental health and behavioral disorders: Secondary | ICD-10-CM | POA: Diagnosis not present

## 2023-03-29 DIAGNOSIS — Z1331 Encounter for screening for depression: Secondary | ICD-10-CM | POA: Diagnosis not present

## 2023-03-29 DIAGNOSIS — G309 Alzheimer's disease, unspecified: Secondary | ICD-10-CM | POA: Diagnosis not present

## 2023-03-29 DIAGNOSIS — G894 Chronic pain syndrome: Secondary | ICD-10-CM | POA: Diagnosis not present

## 2023-03-29 DIAGNOSIS — F03B0 Unspecified dementia, moderate, without behavioral disturbance, psychotic disturbance, mood disturbance, and anxiety: Secondary | ICD-10-CM | POA: Diagnosis not present

## 2023-03-29 DIAGNOSIS — E785 Hyperlipidemia, unspecified: Secondary | ICD-10-CM | POA: Diagnosis not present

## 2023-03-29 DIAGNOSIS — F3341 Major depressive disorder, recurrent, in partial remission: Secondary | ICD-10-CM | POA: Diagnosis not present

## 2023-03-29 DIAGNOSIS — M810 Age-related osteoporosis without current pathological fracture: Secondary | ICD-10-CM | POA: Diagnosis not present

## 2023-03-29 DIAGNOSIS — R82998 Other abnormal findings in urine: Secondary | ICD-10-CM | POA: Diagnosis not present

## 2023-03-29 DIAGNOSIS — I10 Essential (primary) hypertension: Secondary | ICD-10-CM | POA: Diagnosis not present

## 2023-03-30 DIAGNOSIS — Z961 Presence of intraocular lens: Secondary | ICD-10-CM | POA: Diagnosis not present

## 2023-03-30 DIAGNOSIS — H10503 Unspecified blepharoconjunctivitis, bilateral: Secondary | ICD-10-CM | POA: Diagnosis not present

## 2023-03-30 DIAGNOSIS — H25812 Combined forms of age-related cataract, left eye: Secondary | ICD-10-CM | POA: Diagnosis not present

## 2023-03-30 DIAGNOSIS — H21301 Idiopathic cysts of iris, ciliary body or anterior chamber, right eye: Secondary | ICD-10-CM | POA: Diagnosis not present

## 2023-04-25 DIAGNOSIS — M5416 Radiculopathy, lumbar region: Secondary | ICD-10-CM | POA: Diagnosis not present

## 2023-04-25 DIAGNOSIS — M7918 Myalgia, other site: Secondary | ICD-10-CM | POA: Diagnosis not present

## 2023-04-25 DIAGNOSIS — M545 Low back pain, unspecified: Secondary | ICD-10-CM | POA: Diagnosis not present

## 2023-04-25 DIAGNOSIS — Z79899 Other long term (current) drug therapy: Secondary | ICD-10-CM | POA: Diagnosis not present

## 2023-04-25 DIAGNOSIS — G894 Chronic pain syndrome: Secondary | ICD-10-CM | POA: Diagnosis not present

## 2023-04-25 DIAGNOSIS — M48061 Spinal stenosis, lumbar region without neurogenic claudication: Secondary | ICD-10-CM | POA: Diagnosis not present

## 2023-06-01 DIAGNOSIS — G894 Chronic pain syndrome: Secondary | ICD-10-CM | POA: Diagnosis not present

## 2023-06-01 DIAGNOSIS — Z79899 Other long term (current) drug therapy: Secondary | ICD-10-CM | POA: Diagnosis not present

## 2023-06-01 DIAGNOSIS — M48061 Spinal stenosis, lumbar region without neurogenic claudication: Secondary | ICD-10-CM | POA: Diagnosis not present

## 2023-06-01 DIAGNOSIS — M5416 Radiculopathy, lumbar region: Secondary | ICD-10-CM | POA: Diagnosis not present

## 2023-06-01 DIAGNOSIS — M7918 Myalgia, other site: Secondary | ICD-10-CM | POA: Diagnosis not present

## 2023-06-01 DIAGNOSIS — M545 Low back pain, unspecified: Secondary | ICD-10-CM | POA: Diagnosis not present

## 2023-06-07 DIAGNOSIS — H21301 Idiopathic cysts of iris, ciliary body or anterior chamber, right eye: Secondary | ICD-10-CM | POA: Diagnosis not present

## 2023-06-07 DIAGNOSIS — H2512 Age-related nuclear cataract, left eye: Secondary | ICD-10-CM | POA: Diagnosis not present

## 2023-06-07 DIAGNOSIS — Z961 Presence of intraocular lens: Secondary | ICD-10-CM | POA: Diagnosis not present

## 2023-06-07 DIAGNOSIS — H10503 Unspecified blepharoconjunctivitis, bilateral: Secondary | ICD-10-CM | POA: Diagnosis not present

## 2023-07-13 DIAGNOSIS — H0100A Unspecified blepharitis right eye, upper and lower eyelids: Secondary | ICD-10-CM | POA: Diagnosis not present

## 2023-07-13 DIAGNOSIS — H0100B Unspecified blepharitis left eye, upper and lower eyelids: Secondary | ICD-10-CM | POA: Diagnosis not present

## 2023-07-13 DIAGNOSIS — H02835 Dermatochalasis of left lower eyelid: Secondary | ICD-10-CM | POA: Diagnosis not present

## 2023-07-13 DIAGNOSIS — H02832 Dermatochalasis of right lower eyelid: Secondary | ICD-10-CM | POA: Diagnosis not present

## 2023-07-13 DIAGNOSIS — H02831 Dermatochalasis of right upper eyelid: Secondary | ICD-10-CM | POA: Diagnosis not present

## 2023-07-13 DIAGNOSIS — H02834 Dermatochalasis of left upper eyelid: Secondary | ICD-10-CM | POA: Diagnosis not present

## 2023-07-13 DIAGNOSIS — H21301 Idiopathic cysts of iris, ciliary body or anterior chamber, right eye: Secondary | ICD-10-CM | POA: Diagnosis not present

## 2023-07-13 DIAGNOSIS — H04123 Dry eye syndrome of bilateral lacrimal glands: Secondary | ICD-10-CM | POA: Diagnosis not present

## 2023-07-25 DIAGNOSIS — H21301 Idiopathic cysts of iris, ciliary body or anterior chamber, right eye: Secondary | ICD-10-CM | POA: Diagnosis not present

## 2023-07-25 DIAGNOSIS — H10503 Unspecified blepharoconjunctivitis, bilateral: Secondary | ICD-10-CM | POA: Diagnosis not present

## 2023-07-25 DIAGNOSIS — Z961 Presence of intraocular lens: Secondary | ICD-10-CM | POA: Diagnosis not present

## 2023-07-25 DIAGNOSIS — H2512 Age-related nuclear cataract, left eye: Secondary | ICD-10-CM | POA: Diagnosis not present

## 2023-08-29 DIAGNOSIS — M7918 Myalgia, other site: Secondary | ICD-10-CM | POA: Diagnosis not present

## 2023-08-29 DIAGNOSIS — Z79899 Other long term (current) drug therapy: Secondary | ICD-10-CM | POA: Diagnosis not present

## 2023-08-29 DIAGNOSIS — G894 Chronic pain syndrome: Secondary | ICD-10-CM | POA: Diagnosis not present

## 2023-08-29 DIAGNOSIS — M5416 Radiculopathy, lumbar region: Secondary | ICD-10-CM | POA: Diagnosis not present

## 2023-08-29 DIAGNOSIS — M545 Low back pain, unspecified: Secondary | ICD-10-CM | POA: Diagnosis not present

## 2023-08-29 DIAGNOSIS — M48061 Spinal stenosis, lumbar region without neurogenic claudication: Secondary | ICD-10-CM | POA: Diagnosis not present

## 2023-09-11 DIAGNOSIS — L570 Actinic keratosis: Secondary | ICD-10-CM | POA: Diagnosis not present

## 2023-09-11 DIAGNOSIS — L814 Other melanin hyperpigmentation: Secondary | ICD-10-CM | POA: Diagnosis not present

## 2023-09-11 DIAGNOSIS — Z85828 Personal history of other malignant neoplasm of skin: Secondary | ICD-10-CM | POA: Diagnosis not present

## 2023-09-11 DIAGNOSIS — D1801 Hemangioma of skin and subcutaneous tissue: Secondary | ICD-10-CM | POA: Diagnosis not present

## 2023-09-11 DIAGNOSIS — L821 Other seborrheic keratosis: Secondary | ICD-10-CM | POA: Diagnosis not present

## 2023-09-26 DIAGNOSIS — N3281 Overactive bladder: Secondary | ICD-10-CM | POA: Diagnosis not present

## 2023-09-26 DIAGNOSIS — F03B0 Unspecified dementia, moderate, without behavioral disturbance, psychotic disturbance, mood disturbance, and anxiety: Secondary | ICD-10-CM | POA: Diagnosis not present

## 2023-09-26 DIAGNOSIS — F3341 Major depressive disorder, recurrent, in partial remission: Secondary | ICD-10-CM | POA: Diagnosis not present

## 2023-09-26 DIAGNOSIS — G309 Alzheimer's disease, unspecified: Secondary | ICD-10-CM | POA: Diagnosis not present

## 2023-09-26 DIAGNOSIS — I10 Essential (primary) hypertension: Secondary | ICD-10-CM | POA: Diagnosis not present

## 2023-09-26 DIAGNOSIS — M810 Age-related osteoporosis without current pathological fracture: Secondary | ICD-10-CM | POA: Diagnosis not present

## 2023-09-26 DIAGNOSIS — E785 Hyperlipidemia, unspecified: Secondary | ICD-10-CM | POA: Diagnosis not present

## 2023-09-26 DIAGNOSIS — G894 Chronic pain syndrome: Secondary | ICD-10-CM | POA: Diagnosis not present

## 2023-11-08 DIAGNOSIS — H21301 Idiopathic cysts of iris, ciliary body or anterior chamber, right eye: Secondary | ICD-10-CM | POA: Diagnosis not present

## 2023-11-08 DIAGNOSIS — H2512 Age-related nuclear cataract, left eye: Secondary | ICD-10-CM | POA: Diagnosis not present

## 2023-11-08 DIAGNOSIS — H10503 Unspecified blepharoconjunctivitis, bilateral: Secondary | ICD-10-CM | POA: Diagnosis not present

## 2023-11-08 DIAGNOSIS — Z961 Presence of intraocular lens: Secondary | ICD-10-CM | POA: Diagnosis not present

## 2023-11-13 DIAGNOSIS — R32 Unspecified urinary incontinence: Secondary | ICD-10-CM | POA: Diagnosis not present

## 2023-11-13 DIAGNOSIS — R413 Other amnesia: Secondary | ICD-10-CM | POA: Diagnosis not present

## 2023-11-14 DIAGNOSIS — F3341 Major depressive disorder, recurrent, in partial remission: Secondary | ICD-10-CM | POA: Diagnosis not present

## 2023-11-14 DIAGNOSIS — E785 Hyperlipidemia, unspecified: Secondary | ICD-10-CM | POA: Diagnosis not present

## 2023-11-14 DIAGNOSIS — G309 Alzheimer's disease, unspecified: Secondary | ICD-10-CM | POA: Diagnosis not present

## 2023-11-14 DIAGNOSIS — F03B0 Unspecified dementia, moderate, without behavioral disturbance, psychotic disturbance, mood disturbance, and anxiety: Secondary | ICD-10-CM | POA: Diagnosis not present

## 2023-11-14 DIAGNOSIS — I10 Essential (primary) hypertension: Secondary | ICD-10-CM | POA: Diagnosis not present

## 2023-11-14 DIAGNOSIS — G894 Chronic pain syndrome: Secondary | ICD-10-CM | POA: Diagnosis not present

## 2023-11-14 DIAGNOSIS — R051 Acute cough: Secondary | ICD-10-CM | POA: Diagnosis not present

## 2023-11-14 DIAGNOSIS — N3281 Overactive bladder: Secondary | ICD-10-CM | POA: Diagnosis not present

## 2023-11-28 DIAGNOSIS — M5416 Radiculopathy, lumbar region: Secondary | ICD-10-CM | POA: Diagnosis not present

## 2023-11-28 DIAGNOSIS — Z79899 Other long term (current) drug therapy: Secondary | ICD-10-CM | POA: Diagnosis not present

## 2023-11-28 DIAGNOSIS — M545 Low back pain, unspecified: Secondary | ICD-10-CM | POA: Diagnosis not present

## 2023-11-28 DIAGNOSIS — M48061 Spinal stenosis, lumbar region without neurogenic claudication: Secondary | ICD-10-CM | POA: Diagnosis not present

## 2023-11-28 DIAGNOSIS — G894 Chronic pain syndrome: Secondary | ICD-10-CM | POA: Diagnosis not present

## 2023-11-28 DIAGNOSIS — M7918 Myalgia, other site: Secondary | ICD-10-CM | POA: Diagnosis not present

## 2023-12-27 DIAGNOSIS — L821 Other seborrheic keratosis: Secondary | ICD-10-CM | POA: Diagnosis not present

## 2023-12-27 DIAGNOSIS — L570 Actinic keratosis: Secondary | ICD-10-CM | POA: Diagnosis not present

## 2023-12-27 DIAGNOSIS — Z85828 Personal history of other malignant neoplasm of skin: Secondary | ICD-10-CM | POA: Diagnosis not present

## 2024-01-01 DIAGNOSIS — F03B0 Unspecified dementia, moderate, without behavioral disturbance, psychotic disturbance, mood disturbance, and anxiety: Secondary | ICD-10-CM | POA: Diagnosis not present

## 2024-01-01 DIAGNOSIS — I10 Essential (primary) hypertension: Secondary | ICD-10-CM | POA: Diagnosis not present

## 2024-01-01 DIAGNOSIS — J189 Pneumonia, unspecified organism: Secondary | ICD-10-CM | POA: Diagnosis not present

## 2024-01-01 DIAGNOSIS — Z1152 Encounter for screening for COVID-19: Secondary | ICD-10-CM | POA: Diagnosis not present

## 2024-01-01 DIAGNOSIS — J029 Acute pharyngitis, unspecified: Secondary | ICD-10-CM | POA: Diagnosis not present

## 2024-01-01 DIAGNOSIS — R6883 Chills (without fever): Secondary | ICD-10-CM | POA: Diagnosis not present

## 2024-01-01 DIAGNOSIS — G309 Alzheimer's disease, unspecified: Secondary | ICD-10-CM | POA: Diagnosis not present

## 2024-01-01 DIAGNOSIS — R051 Acute cough: Secondary | ICD-10-CM | POA: Diagnosis not present

## 2024-01-02 NOTE — Progress Notes (Deleted)
 GYNECOLOGY  VISIT   HPI: 89 y.o.   Married  Caucasian female   [phone] with No LMP recorded. Patient is postmenopausal.   here for: 1 year follow up- vulvitis      GYNECOLOGIC HISTORY: No LMP recorded. Patient is postmenopausal. Contraception:  PMP Menopausal hormone therapy:  n/a Last 2 paps:  05/17/10 neg History of abnormal Pap or positive HPV:  no Mammogram:          OB History     Gravida  4   Para  3   Term      Preterm      AB  1   Living  3      SAB      IAB      Ectopic      Multiple      Live Births                 Patient Active Problem List   Diagnosis Date Noted   Shingles 01/05/2023   Pain in right foot 06/25/2020   Lumbar radiculopathy 05/04/2020   Chronic low back pain 08/16/2017   Degeneration of lumbar intervertebral disc 08/16/2017   Pain in right knee 06/26/2017   S/P left THA, AA 09/01/2015   Mixed hyperlipidemia    RUQ abdominal pain 02/23/2015   Nausea without vomiting 02/23/2015   Elevated LFTs 02/23/2015   Loss of appetite 02/23/2015   Dementia with behavioral disturbance (HCC) 02/04/2015   Abdominal pain 02/04/2015   Leaking of urine 02/04/2015   Dyspepsia 07/17/2013   Mild cognitive impairment 09/21/2012   Poison ivy dermatitis 08/27/2012   Osteopenia    Lichen sclerosus    ARM PAIN 06/30/2010   Hyperlipidemia 07/19/2008   Essential hypertension 07/19/2008   BRUIT 07/19/2008    Past Medical History:  Diagnosis Date   Arthritis    Disturbances of sensation of smell and taste    Essential hypertension    a.  02/2007 Echo: Nl EF, triv AI.   GERD (gastroesophageal reflux disease)    Lichen sclerosus    Lipoma of face    Memory loss    Mixed hyperlipidemia    Osteopenia    Other persistent mental disorders due to conditions classified elsewhere    Tubular adenoma of colon 2017    Past Surgical History:  Procedure Laterality Date   CATARACT EXTRACTION, BILATERAL     OOPHORECTOMY  1988   DIAG LAP W  BSO   TONSILECTOMY, ADENOIDECTOMY, BILATERAL MYRINGOTOMY AND TUBES     TOTAL HIP ARTHROPLASTY Left 09/01/2015   Procedure: LEFT TOTAL HIP ARTHROPLASTY ANTERIOR APPROACH;  Surgeon: Donnice Car, MD;  Location: [facility];  Service: Orthopedics;  Laterality: Left;    Current Outpatient Medications  Medication Sig Dispense Refill   acetaminophen  (TYLENOL  8 HOUR) 650 MG CR tablet Take 1,300 mg by mouth every 8 (eight) hours as needed for pain.      amLODipine (NORVASC) 5 MG tablet Take 1 tablet by mouth daily.     betamethasone  valerate ointment (VALISONE ) 0.1 % Apply 1 Application topically 2 (two) times daily. Place in a thin layer to skin twice daily for 2 weeks and then use twice a week at bedtime. 45 g 0   Cholecalciferol (VITAMIN D3) 50 MCG (2000 UT) TABS Take 2,000 Units by mouth daily.     diclofenac sodium (VOLTAREN) 1 % GEL Apply 1 application topically 3 (three) times daily as needed (for back pain).  docusate sodium  (COLACE) 50 MG capsule Take 50 mg by mouth daily.     escitalopram  (LEXAPRO ) 5 MG tablet TAKE 1 TABLET BY MOUTH DAILY*CALL OFFICE TO MAKE APPOINTMENT* (Patient taking differently: Take 10 mg by mouth daily.) 30 tablet 0   fluconazole (DIFLUCAN) 200 MG tablet Take 1 tablet by mouth daily.     fluticasone (FLONASE) 50 MCG/ACT nasal spray Place 1 spray into both nostrils daily as needed for allergies or rhinitis.     gabapentin (NEURONTIN) 100 MG capsule Take by mouth.     galantamine  (RAZADYNE  ER) 24 MG 24 hr capsule TAKE 1 CAPSULE BY MOUTH EVERY DAY (Patient taking differently: Take 24 mg by mouth daily with breakfast.) 90 capsule 1   linaclotide (LINZESS) 72 MCG capsule Take 72 mcg by mouth every other day.      MACROBID 100 MG capsule 1 capsule with food Orally twice a day x 7 for 7 days     Multiple Vitamin (MULTIVITAMIN WITH MINERALS) TABS tablet Take 1 tablet by mouth daily.     NAMENDA  XR 28 MG CP24 24 hr capsule TAKE 1 CAPSULE(28 MG) BY MOUTH DAILY (Patient taking  differently: Take 28 mg by mouth daily.) 30 capsule 5   nystatin  (MYCOSTATIN /NYSTOP ) powder Apply 1 Application topically 3 (three) times daily. Apply to affected area for up to 7 days 30 g 2   nystatin  cream (MYCOSTATIN ) Apply 1 application topically 2 (two) times daily as needed (for yeast infection).     nystatin -triamcinolone  ointment (MYCOLOG) Apply 1 application topically 2 (two) times daily. Apply BID for up to 7 days. 60 g 0   olmesartan (BENICAR) 20 MG tablet Take 40 mg by mouth daily.      polyethylene glycol (MIRALAX  / GLYCOLAX ) 17 g packet Take 17 g by mouth daily.     pregabalin (LYRICA) 50 MG capsule Take by mouth.     valACYclovir (VALTREX) 1000 MG tablet Take 1,000 mg by mouth 3 (three) times daily.     No current facility-administered medications for this visit.     ALLERGIES: Sulfa antibiotics, Denosumab , and Lidocaine  Family History  Problem Relation Age of Onset   Cancer Mother        OVARIAN   Diabetes Father    Hypertension Father     Social History   Socioeconomic History   Marital status: Married    Spouse name: Not on file   Number of children: 3   Years of education: Not on file   Highest education level: Not on file  Occupational History   Occupation: retired travel agent    Comment: full time  Tobacco Use   Smoking status: Former    Current packs/day: 0.00    Types: Cigarettes    Quit date: 09/13/2002    Years since quitting: 21.3   Smokeless tobacco: Never  Vaping Use   Vaping status: Never Used  Substance and Sexual Activity   Alcohol  use: Yes    Alcohol /week: 21.0 standard drinks of alcohol     Types: 14 Glasses of wine, 7 Standard drinks or equivalent per week    Comment: 2 glasses of wine each evening   Drug use: No   Sexual activity: Not Currently    Birth control/protection: Abstinence  Other Topics Concern   Not on file  Social History Narrative   This patient is widowed. She lives alone  in a single level  home in [place],  Personal Care Services from [employer] retirement community to assist with  meals and transportation.    Patient is retired Firefighter.    Main exercise is walking, she has a Development worker, international aid.   Stopped smoking 20 years ago, drinks 1-2 glasses of wine a  day   Patient has 2 sons and one daughter, all live out of town.   Has a Living Will, DO NOT RESUSCITATE.   Social Drivers of Corporate investment banker Strain: Not on file  Food Insecurity: Not on file  Transportation Needs: Not on file  Physical Activity: Not on file  Stress: Not on file  Social Connections: Not on file  Intimate Partner Violence: Not on file    Review of Systems  PHYSICAL EXAMINATION:   There were no vitals taken for this visit.    General appearance: alert, cooperative and appears stated age Head: Normocephalic, without obvious abnormality, atraumatic Neck: no adenopathy, supple, symmetrical, trachea midline and thyroid  normal to inspection and palpation Lungs: clear to auscultation bilaterally Breasts: normal appearance, no masses or tenderness, No nipple retraction or dimpling, No nipple discharge or bleeding, No axillary or supraclavicular adenopathy Heart: regular rate and rhythm Abdomen: soft, non-tender, no masses,  no organomegaly Extremities: extremities normal, atraumatic, no cyanosis or edema Skin: Skin color, texture, turgor normal. No rashes or lesions Lymph nodes: Cervical, supraclavicular, and axillary nodes normal. No abnormal inguinal nodes palpated Neurologic: Grossly normal  Pelvic: External genitalia:  no lesions              Urethra:  normal appearing urethra with no masses, tenderness or lesions              Bartholins and Skenes: normal                 Vagina: normal appearing vagina with normal color and discharge, no lesions              Cervix: no lesions                Bimanual Exam:  Uterus:  normal size, contour, position, consistency, mobility, non-tender              Adnexa: no  mass, fullness, tenderness              Rectal exam: {yes no:314532}.  Confirms.              Anus:  normal sphincter tone, no lesions  Chaperone was present for exam:  {BSCHAPERONE:31226::Emily F, CMA}  ASSESSMENT:    PLAN:    {LABS (Optional):23779}  ***  total time was spent for this patient encounter, including preparation, face-to-face counseling with the patient, coordination of care, and documentation of the encounter.

## 2024-01-08 ENCOUNTER — Ambulatory Visit: Payer: Medicare Other | Admitting: Obstetrics and Gynecology

## 2024-01-16 DIAGNOSIS — M7989 Other specified soft tissue disorders: Secondary | ICD-10-CM | POA: Diagnosis not present

## 2024-01-16 DIAGNOSIS — F03B0 Unspecified dementia, moderate, without behavioral disturbance, psychotic disturbance, mood disturbance, and anxiety: Secondary | ICD-10-CM | POA: Diagnosis not present

## 2024-01-16 DIAGNOSIS — G309 Alzheimer's disease, unspecified: Secondary | ICD-10-CM | POA: Diagnosis not present

## 2024-01-16 DIAGNOSIS — G894 Chronic pain syndrome: Secondary | ICD-10-CM | POA: Diagnosis not present

## 2024-01-16 DIAGNOSIS — R1312 Dysphagia, oropharyngeal phase: Secondary | ICD-10-CM | POA: Diagnosis not present

## 2024-02-07 DIAGNOSIS — Z1152 Encounter for screening for COVID-19: Secondary | ICD-10-CM | POA: Diagnosis not present

## 2024-02-07 DIAGNOSIS — R059 Cough, unspecified: Secondary | ICD-10-CM | POA: Diagnosis not present

## 2024-02-07 DIAGNOSIS — R0981 Nasal congestion: Secondary | ICD-10-CM | POA: Diagnosis not present

## 2024-02-07 DIAGNOSIS — R1312 Dysphagia, oropharyngeal phase: Secondary | ICD-10-CM | POA: Diagnosis not present

## 2024-02-07 DIAGNOSIS — R5383 Other fatigue: Secondary | ICD-10-CM | POA: Diagnosis not present

## 2024-02-07 DIAGNOSIS — J029 Acute pharyngitis, unspecified: Secondary | ICD-10-CM | POA: Diagnosis not present

## 2024-02-07 DIAGNOSIS — F03B0 Unspecified dementia, moderate, without behavioral disturbance, psychotic disturbance, mood disturbance, and anxiety: Secondary | ICD-10-CM | POA: Diagnosis not present

## 2024-02-28 DIAGNOSIS — M545 Low back pain, unspecified: Secondary | ICD-10-CM | POA: Diagnosis not present

## 2024-02-28 DIAGNOSIS — M5416 Radiculopathy, lumbar region: Secondary | ICD-10-CM | POA: Diagnosis not present

## 2024-02-28 DIAGNOSIS — M48061 Spinal stenosis, lumbar region without neurogenic claudication: Secondary | ICD-10-CM | POA: Diagnosis not present

## 2024-02-28 DIAGNOSIS — M7918 Myalgia, other site: Secondary | ICD-10-CM | POA: Diagnosis not present

## 2024-02-28 DIAGNOSIS — G894 Chronic pain syndrome: Secondary | ICD-10-CM | POA: Diagnosis not present

## 2024-02-28 DIAGNOSIS — Z79899 Other long term (current) drug therapy: Secondary | ICD-10-CM | POA: Diagnosis not present

## 2024-02-29 DIAGNOSIS — Z79899 Other long term (current) drug therapy: Secondary | ICD-10-CM | POA: Diagnosis not present

## 2024-03-05 ENCOUNTER — Other Ambulatory Visit (HOSPITAL_COMMUNITY): Payer: Self-pay | Admitting: Internal Medicine

## 2024-03-05 DIAGNOSIS — R1312 Dysphagia, oropharyngeal phase: Secondary | ICD-10-CM

## 2024-03-06 ENCOUNTER — Ambulatory Visit (HOSPITAL_COMMUNITY)
Admission: RE | Admit: 2024-03-06 | Discharge: 2024-03-06 | Disposition: A | Discharge status: Home / Self Care | Source: Ambulatory Visit | Attending: Internal Medicine | Admitting: Internal Medicine

## 2024-03-06 ENCOUNTER — Other Ambulatory Visit (HOSPITAL_COMMUNITY): Payer: Self-pay | Admitting: Internal Medicine

## 2024-03-06 DIAGNOSIS — R1312 Dysphagia, oropharyngeal phase: Secondary | ICD-10-CM | POA: Diagnosis not present

## 2024-03-06 DIAGNOSIS — K224 Dyskinesia of esophagus: Secondary | ICD-10-CM | POA: Diagnosis not present

## 2024-03-12 NOTE — Progress Notes (Signed)
 Ophthalmology Department Clinical Visit Note     CHIEF COMPLAINT Patient presents for Follow Up Exam (Says her eyes have been working pretty well, but she can't see things on the tv or when reading the newspaper with her bifocals. Taking systane and muro 128 )   HISTORY OF PRESENT ILLNESS: Margaret [name] is a 89 y.o. female who presents to the clinic today for a follow up evaluation regarding cyst of iris OD. Referred by Dr. Scarlet. The patient has a history of pseudophakia OD per Dr. Roz and YAG OD per Dr. Roz.  Margaret [name] reports no changes since her last visit.  HPI     Follow Up Exam   Follow-Up For::  Cyst of iris  .  In right eye.  The patient reports the following associated symptoms blurred vision. Additional comments: Says her eyes have been working pretty well, but she can't see things on the tv or when reading the newspaper with her bifocals. Taking systane and muro 128       Last edited by Margaret [name], OA on 03/13/2024 12:53 PM.      HISTORICAL INFORMATION:   CURRENT MEDICATIONS: Current Outpatient Medications (Ophthalmic Drugs)  Medication Sig  . cycloSPORINE (RESTASIS) 0.05 % ophthalmic emulsion INSTILL 1 DROP INTO BOTH EYES DAILY   No current facility-administered medications for this visit. (Ophthalmic Drugs)   Current Outpatient Medications (Other)  Medication Sig  . acetaminophen  (TYLENOL ) 650 mg ER tablet Take 1,300 mg by mouth.  SABRA amLODIPine (NORVASC) 5 mg tablet   . betamethasone  valerate (VALISONE ) 0.1 % ointment APPLY SMALL AMOUNT TOPICALLY TO THE AFFECTED AREA DAILY AS NEEDED  . buprenorphine (BUTRANS) 20 mcg/hour ptwk patch Place 1 patch on the skin every 7 days.  . celecoxib (CeleBREX) 100 mg capsule Take 1 capsule (100 mg total) by mouth 2 (two) times a day. Take with food. Do not take with any other NSAIDS.  . cetirizine  (ZyrTEC ) 10 mg tablet Take by mouth.  . cholecalciferol (VITAMIN D3) 2,000 unit tablet Take 2,000 Units by  mouth.  . diclofenac sodium (VOLTAREN) 1 % gel Apply 1 g topically 2 (two) times a day as needed (pain).  . docusate sodium  (Colace Clear) 50 mg capsule Take 50 mg by mouth.  . escitalopram  (LEXAPRO ) 10 mg tablet Take 10 mg by mouth daily.  . fluconazole (DIFLUCAN) 200 mg tablet Take 1 tablet by mouth daily.  . fluticasone propionate (FLONASE) 50 mcg/spray nasal spray 1 spray daily.  SABRA gabapentin (NEURONTIN) 100 mg capsule Take 3 capsules (300 mg total) by mouth every morning AND 2 capsules (200 mg total) 2 (two) times a day.  . galantamine  ER (RAZADYNE ) 24 mg C24P ER capsule   . hydrocortisone 2.5 % cream as needed.  SABRA ipratropium (ATROVENT) 21 mcg (0.03 %) nasal spray 2 sprays 2 (two) times a day. duration: 30 days  . ketoconazole (NIZORAL) 2 % shampoo   . lidocaine (XYLOCAINE) 5 % ointment Apply 1-2g topically over low back up to three times daily  . Linzess 145 mcg cap capsule   . mupirocin (BACTROBAN) 2 % ointment as needed.  . Myrbetriq 25 mg Tb24 Take 25 mg by mouth daily.  . naloxone (NARCAN) 4 mg/actuation spry nasal spray Administer 1 spray into affected nostril(s) as needed (for suspected overdose. Call EMS immediately after 1st dose. May repeat in 2-3 minutes as needed.).  . Namenda  XR 28 mg CSpX   . nystatin  (MYCOSTATIN ) cream as needed.  SABRA olmesartan (BENICAR)  20 mg tablet 20 mg.  . polyethylene glycol (MIRALAX ) 17 gram powd powder Take 17 g by mouth daily.  . triamcinolone  (KENALOG ) 0.1 % cream as needed.   No current facility-administered medications for this visit. (Other)    ALLERGIES Allergies  Allergen Reactions  . Sulfa (Sulfonamide Antibiotics) Other (See Comments)    Reaction unknown  . Denosumab  Rash  . Lidocaine (Pf) Rash  . Sulfasalazine Other (See Comments)    Reaction unknown    PAST MEDICAL HISTORY Past Medical History:  Diagnosis Date  . Arthritis   . Cataract   . Hypertension    Past Surgical History:  Procedure Laterality Date  . CATARACT  EXTRACTION Right 04/01/2009   Procedure: YAG CAPSULOTOMY  . CATARACT EXTRACTION W/  INTRAOCULAR LENS IMPLANT Right 10/24/2007   Procedure: CATARACT EXTRACTION W/  INTRAOCULAR LENS IMPLANT; SN60WF 25.0     FAMILY HISTORY Family History  Problem Relation Name Age of Onset  . Cancer Mother    . Alzheimer's disease Father    . Diabetes Neg Hx    . Cataracts Neg Hx    . Glaucoma Neg Hx    . Hypertension Neg Hx    . Macular degeneration Neg Hx    . Retinal detachment Neg Hx    . Strabismus Neg Hx      SOCIAL HISTORY Social History   Tobacco Use  . Smoking status: Former  . Smokeless tobacco: Never  Substance Use Topics  . Alcohol  use: Yes         OPHTHALMIC EXAM:   Base Eye Exam     Visual Acuity (Snellen - Linear)       Right Left   Dist Margaret [name] 20/25 -2 20/60 -1  Couldn't find PH in left eye.         Tonometry (Applanation, 1:15 PM)       Right Left   Pressure 12          Pupils       Pupils APD   Right PERRL None   Left PERRL None         Neuro/Psych     Oriented x3: Yes   Mood/Affect: Normal           Slit Lamp and Fundus Exam     External Exam       Right Left   External Normal Normal         Slit Lamp Exam       Right Left   Lids/Lashes Normal Normal   Conjunctiva/Sclera White and quiet White and quiet   Cornea Clear Clear   Anterior Chamber Deep and quiet Deep and quiet   Iris Iris cyst covering 9oc to 6oc extending into pupil Round and reactive   Lens PCIOL 3+ NS   Anterior Vitreous Normal Normal            IMAGING AND PROCEDURES:     ASSESSMENT/PLAN:  1. Cyst of iris of right eye      2. Pseudophakia of right eye      3. Age-related nuclear cataract of left eye      4. Blepharoconjunctivitis of both eyes, unspecified blepharoconjunctivitis type        Iris Cyst OD - Discussed etiology with patient, will monitor, stable - SL photo obtained 07/25/2023 - B-Scan obtained 07/25/2023  Pseudophakia OD  per Dr. Roz YAG OD per Dr. Roz - Stable  Cataract OS - The patient complains of painless and progressive decreased  vision in the left eye since 1 year ago, exacerbated by glare from headlights at night, and interfering with activities of daily living. - The patient expresses a desire for improved vision and is interested in surgical correction if indicated.  - A reasonable expectation exists that lens surgery will significantly improve both the visual and functional status of the patient. - Discussed the option of multifocal lenses, explained that I would not advise having them placed vs monofocal lenses due to decreased overall visual potential. Pt wishes to proceed with monofocal lenses. - Discussed None vs traditional phaco as possible treatment with rba. Pt wishes to proceed with observation.   Blepharoconjunctivitis OU - Managed by Dr. Scarlet - WC/LH - Restasis BID OU  Call Margaret [name] [phone]  RTC - 6 month follow up Return in about 6 months (around 09/10/2024) for Follow up.   There are no Patient Instructions on file for this visit.    Ophthalmic Meds Ordered this visit:  There were no meds ordered this visit.    Explained the diagnoses, plan, and follow up with the patient and they expressed understanding.  Patient expressed understanding of the importance of proper follow up care.    Abbreviations: M myopia (nearsighted); A astigmatism; H hyperopia (farsighted); P presbyopia; Mrx spectacle prescription;  CTL contact lenses; OD right eye; OS left eye; OU both eyes  XT exotropia; ET esotropia; PEK punctate epithelial keratitis; PEE punctate epithelial erosions; DES dry eye syndrome; MGD meibomian gland dysfunction; ATs artificial tears; PFAT's preservative free artificial tears; NSC nuclear sclerotic cataract; PSC posterior subcapsular cataract; ERM epi-retinal membrane; PVD posterior vitreous detachment; RD retinal detachment; DM diabetes mellitus; DR  diabetic retinopathy; NPDR non-proliferative diabetic retinopathy; PDR proliferative diabetic retinopathy; CSME clinically significant macular edema; DME diabetic macular edema; dbh dot blot hemorrhages; CWS cotton wool spot; POAG primary open angle glaucoma; C/D cup-to-disc ratio; HVF humphrey visual field; GVF goldmann visual field; OCT optical coherence tomography; IOP intraocular pressure; BRVO Branch retinal vein occlusion; CRVO central retinal vein occlusion; CRAO central retinal artery occlusion; BRAO branch retinal artery occlusion; RT retinal tear; SB scleral buckle; PPV pars plana vitrectomy; VH Vitreous hemorrhage; PRP panretinal laser photocoagulation; IVK intravitreal kenalog ; VMT vitreomacular traction; MH Macular hole;  NVD neovascularization of the disc; NVE neovascularization elsewhere; AREDS age related eye disease study; ARMD age related macular degeneration; POAG primary open angle glaucoma; EBMD epithelial/anterior basement membrane dystrophy; ACIOL anterior chamber intraocular lens; IOL intraocular lens; PCIOL posterior chamber intraocular lens; Phaco/IOL phacoemulsification with intraocular lens placement; PRK photorefractive keratectomy; LASIK laser assisted in situ keratomileusis; HTN hypertension; DM diabetes mellitus; COPD chronic obstructive pulmonary disease   This document serves as a record of services personally performed by Donnice Sickles, MD. It was created on their behalf by Jinnie Caldron, a trained medical scribe. The creation of this record is the provider's dictation and/or activities during the visit.

## 2024-03-13 DIAGNOSIS — H21301 Idiopathic cysts of iris, ciliary body or anterior chamber, right eye: Secondary | ICD-10-CM | POA: Diagnosis not present

## 2024-03-13 DIAGNOSIS — H2512 Age-related nuclear cataract, left eye: Secondary | ICD-10-CM | POA: Diagnosis not present

## 2024-03-13 DIAGNOSIS — H10503 Unspecified blepharoconjunctivitis, bilateral: Secondary | ICD-10-CM | POA: Diagnosis not present

## 2024-03-13 DIAGNOSIS — Z961 Presence of intraocular lens: Secondary | ICD-10-CM | POA: Diagnosis not present

## 2024-03-18 ENCOUNTER — Encounter: Payer: Self-pay | Admitting: Physician Assistant

## 2024-03-18 ENCOUNTER — Other Ambulatory Visit (INDEPENDENT_AMBULATORY_CARE_PROVIDER_SITE_OTHER)

## 2024-03-18 ENCOUNTER — Ambulatory Visit (INDEPENDENT_AMBULATORY_CARE_PROVIDER_SITE_OTHER): Admitting: Physician Assistant

## 2024-03-18 ENCOUNTER — Other Ambulatory Visit (HOSPITAL_COMMUNITY): Payer: Self-pay | Admitting: *Deleted

## 2024-03-18 VITALS — BP 128/82 | HR 72

## 2024-03-18 DIAGNOSIS — R1319 Other dysphagia: Secondary | ICD-10-CM

## 2024-03-18 DIAGNOSIS — R059 Cough, unspecified: Secondary | ICD-10-CM

## 2024-03-18 DIAGNOSIS — E538 Deficiency of other specified B group vitamins: Secondary | ICD-10-CM

## 2024-03-18 DIAGNOSIS — R1312 Dysphagia, oropharyngeal phase: Secondary | ICD-10-CM | POA: Diagnosis not present

## 2024-03-18 DIAGNOSIS — K219 Gastro-esophageal reflux disease without esophagitis: Secondary | ICD-10-CM

## 2024-03-18 DIAGNOSIS — R1310 Dysphagia, unspecified: Secondary | ICD-10-CM

## 2024-03-18 DIAGNOSIS — D509 Iron deficiency anemia, unspecified: Secondary | ICD-10-CM

## 2024-03-18 LAB — IBC + FERRITIN
Ferritin: 58.4 ng/mL (ref 10.0–291.0)
Iron: 78 ug/dL (ref 42–145)
Saturation Ratios: 31.3 % (ref 20.0–50.0)
TIBC: 249.2 ug/dL — ABNORMAL LOW (ref 250.0–450.0)
Transferrin: 178 mg/dL — ABNORMAL LOW (ref 212.0–360.0)

## 2024-03-18 LAB — CBC WITH DIFFERENTIAL/PLATELET
Basophils Absolute: 0.1 K/uL (ref 0.0–0.1)
Basophils Relative: 1 % (ref 0.0–3.0)
Eosinophils Absolute: 0.2 K/uL (ref 0.0–0.7)
Eosinophils Relative: 2.6 % (ref 0.0–5.0)
HCT: 37.5 % (ref 36.0–46.0)
Hemoglobin: 12.9 g/dL (ref 12.0–15.0)
Lymphocytes Relative: 29.4 % (ref 12.0–46.0)
Lymphs Abs: 2.7 K/uL (ref 0.7–4.0)
MCHC: 34.4 g/dL (ref 30.0–36.0)
MCV: 96.4 fl (ref 78.0–100.0)
Monocytes Absolute: 0.6 K/uL (ref 0.1–1.0)
Monocytes Relative: 6.6 % (ref 3.0–12.0)
Neutro Abs: 5.6 K/uL (ref 1.4–7.7)
Neutrophils Relative %: 60.4 % (ref 43.0–77.0)
Platelets: 303 K/uL (ref 150.0–400.0)
RBC: 3.89 Mil/uL (ref 3.87–5.11)
RDW: 13.6 % (ref 11.5–15.5)
WBC: 9.2 K/uL (ref 4.0–10.5)

## 2024-03-18 LAB — COMPREHENSIVE METABOLIC PANEL WITH GFR
ALT: 12 U/L (ref 0–35)
AST: 16 U/L (ref 0–37)
Albumin: 4.1 g/dL (ref 3.5–5.2)
Alkaline Phosphatase: 53 U/L (ref 39–117)
BUN: 12 mg/dL (ref 6–23)
CO2: 30 meq/L (ref 19–32)
Calcium: 8.9 mg/dL (ref 8.4–10.5)
Chloride: 105 meq/L (ref 96–112)
Creatinine, Ser: 0.51 mg/dL (ref 0.40–1.20)
GFR: 83.79 mL/min (ref >60.00)
Glucose, Bld: 92 mg/dL (ref 70–99)
Potassium: 3.4 meq/L — ABNORMAL LOW (ref 3.5–5.1)
Sodium: 141 meq/L (ref 135–145)
Total Bilirubin: 0.4 mg/dL (ref 0.2–1.2)
Total Protein: 6.1 g/dL (ref 6.0–8.3)

## 2024-03-18 LAB — VITAMIN B12: Vitamin B-12: 591 pg/mL (ref 211–911)

## 2024-03-18 NOTE — Addendum Note (Signed)
 Addended by: WILLIEMAE JOLA PARAS on: 03/18/2024 03:45 PM   Modules accepted: Orders

## 2024-03-18 NOTE — Progress Notes (Addendum)
 03/18/2024 Margaret [name] [id number] [birth year]  Referring provider: Loreli Elsie JONETTA Mickey., MD Primary GI doctor: Dr. Suzann (Dr. Aneita)  ASSESSMENT AND PLAN:  Dysphagia with abnormal barium swallow 02/09/2004 EGD with Dr. Bernette small hiatal hernia, wide esophageal ring at GE junction negative H. pylori gastritis History of recurrent oral Candida 03/06/2024 barium swallow no aspiration, nonspecific esophageal dysmotility, smooth narrowing/stricturing distal esophagus with impaction of 13 mm barium tablet appearance is not irregular mass Per caregiver has had recent pnuemonia Worsening dysphagia with liquids and large pills. Barium swallow showed smooth tapering at GE junction with barium tablet stuck, no aspiration, mild dysmotility likely age-related or reflux-related. Differential includes esophageal stricture or reflux-related issues. Empiric treatment deferred until after endoscopy. - Plan EGD in hospital setting due to motility.I discussed risks of EGD with patient today, including risk of sedation, bleeding or perforation.  Patient provides understanding and gave verbal consent to proceed. - Evaluate with modified barium swallow. - Refer to speech therapy for swallowing techniques and caregiver education.  Glossitis with evaluation for iron and vitamin B12 deficiency Glossitis with loss of papillae on tongue. Evaluation for potential iron and vitamin B12 deficiency indicated. - Check iron, ferritin, and B12 levels.  Constipation 11/16/2020 CTAP W diffuse fatty infiltration, simple cyst, diverticula without diverticulitis Patient in wheelchair for long distance  Personal history of tubular adenomatous polyps 05/15/2015 pedunculated polyp sigmoid colon tubular adenoma, moderate diverticulosis sigmoid descending grade 1 internal hemorrhoids No recall due to age  Fatty liver 11/16/2020 CTAP W diffuse fatty infiltration of the liver 7 mm cyst and simple cyst on right lobe  normal gallbladder pancreas and spleen Check Cbc/liver function  Patient Care Team: Loreli Elsie JONETTA Mickey., MD as PCP - General (Internal Medicine) Pietro Redell RAMAN, MD as PCP - Cardiology (Cardiology) [facility]  HISTORY OF PRESENT ILLNESS: 89 y.o. female with a past medical history listed below presents for evaluation of dysphagia.   Last seen in the office 2021 by Elida Nyle Sharps for rectal bleeding  Daughter previously did not want AI used. Used out of the room with patient.   History of Present Illness   Margaret [name] is an 89 year old female with Alzheimer's disease who presents with dysphagia. She is accompanied by her caregiver. She was referred by her primary care physician for evaluation of dysphagia.  She has been experiencing worsening dysphagia, particularly with swallowing liquids and large pills. [name] She was recently treated with antibiotics for pneumonia, raising concerns about potential aspiration. A barium swallow study showed smooth tapering down to the esophageal gastro junction where a barium tablet became stuck; there was no evidence of aspiration during the study, and mild dysmotility was noted. There was no evidence of aspiration during the study.  She denies any issues with swallowing food, although her caregiver notes that food is chopped finely to aid in swallowing. No symptoms such as reflux, nausea, vomiting, weight loss, or changes in stool color are present. No odynophagia is reported.  She is currently taking Celebrex 100 mg once daily. She also has a history of recurrent yeast infections, although there is no current evidence of oral yeast infection. She does have glossitis with loss of papillae on her tongue.  She reports bilateral leg pain. Her caregiver has noted some memory worsening, and she is being treated for  Alzheimer's disease. Mobility is limited; she  uses a wheelchair and walks with a cane, requiring significant assistance to get onto the examination table.      She  reports that she quit smoking about 21 years ago. Her smoking use included cigarettes. She has never used smokeless tobacco. She reports current alcohol  use of about 21.0 standard drinks of alcohol  per week. She reports that she does not use drugs.  RELEVANT GI HISTORY, IMAGING AND LABS: Results   RADIOLOGY Barium swallow: Smooth tapering at esophagogastric junction, barium tablet stuck, no aspiration, mild dysmotility      CBC    Component Value Date/Time   WBC 9.8 11/16/2020 1833   RBC 4.29 11/16/2020 1833   HGB 14.1 11/16/2020 1833   HGB 13.3 02/03/2015 1004   HCT 41.8 11/16/2020 1833   HCT 39.3 02/03/2015 1004   PLT 275 11/16/2020 1833   PLT 321 02/03/2015 1004   MCV 97.4 11/16/2020 1833   MCV 96 02/03/2015 1004   MCH 32.9 11/16/2020 1833   MCHC 33.7 11/16/2020 1833   RDW 13.2 11/16/2020 1833   RDW 13.3 02/03/2015 1004   LYMPHSABS 1.0 11/16/2020 1833   LYMPHSABS 1.9 02/03/2015 1004   MONOABS 0.7 11/16/2020 1833   EOSABS 0.0 11/16/2020 1833   EOSABS 0.1 02/03/2015 1004   BASOSABS 0.1 11/16/2020 1833   BASOSABS 0.1 02/03/2015 1004   No results for input(s): HGB in the last 8760 hours.  CMP     Component Value Date/Time   NA 135 11/16/2020 1833   NA 141 02/03/2015 1004   K 3.4 (L) 11/16/2020 1833   CL 102 11/16/2020 1833   CO2 23 11/16/2020 1833   GLUCOSE 117 (H) 11/16/2020 1833   BUN 10 11/16/2020 1833   BUN 16 02/03/2015 1004   CREATININE 0.53 11/16/2020 1833   CREATININE 0.62 04/07/2014 1408   CALCIUM  9.0 11/16/2020 1833   PROT 6.7 11/16/2020 1833   PROT 6.3 02/03/2015 1004   ALBUMIN 3.8 11/16/2020 1833   ALBUMIN 4.4 02/03/2015 1004   AST 25 11/16/2020 1833   ALT 22 11/16/2020 1833   ALKPHOS 48 11/16/2020 1833   BILITOT 0.8 11/16/2020 1833   BILITOT 0.8 02/03/2015 1004   GFRNONAA >60  11/16/2020 1833   GFRNONAA 87 04/07/2014 1408   GFRAA >60 09/02/2015 0413   GFRAA >89 04/07/2014 1408      Latest Ref Rng & Units 11/16/2020    6:33 PM 02/03/2015   10:04 AM 01/12/2015   10:09 AM  Hepatic Function  Total Protein 6.5 - 8.1 g/dL 6.7  6.3  6.6   Albumin 3.5 - 5.0 g/dL 3.8  4.4  4.5   AST 15 - 41 U/L 25  27  26    ALT 0 - 44 U/L 22  30  22    Alk Phosphatase 38 - 126 U/L 48  44  50   Total Bilirubin 0.3 - 1.2 mg/dL 0.8  0.8  0.5   Bilirubin, Direct 0.00 - 0.40 mg/dL  9.77  9.82       Current Medications:    Current Outpatient Medications (Cardiovascular):    amLODipine (NORVASC) 5 MG tablet, Take 1 tablet by mouth daily.   olmesartan (BENICAR) 20 MG tablet, Take 40 mg by mouth daily.   Current Outpatient Medications (Respiratory):    cetirizine  (ZYRTEC ) 10 MG tablet, Take by mouth.   fluticasone (FLONASE) 50 MCG/ACT nasal spray, Place 1 spray into both nostrils daily as needed for allergies or rhinitis.   ipratropium (ATROVENT) 0.03 % nasal spray,  2 sprays.  Current Outpatient Medications (Analgesics):    acetaminophen  (TYLENOL  8 HOUR) 650 MG CR tablet, Take 1,300 mg by mouth every 8 (eight) hours as needed for pain.    buprenorphine (BUTRANS) 20 MCG/HR PTWK, 1 patch once a week.   celecoxib (CELEBREX) 100 MG capsule, Take 100 mg by mouth.   Current Outpatient Medications (Other):    betamethasone  valerate ointment (VALISONE ) 0.1 %, Apply 1 Application topically 2 (two) times daily. Place in a thin layer to skin twice daily for 2 weeks and then use twice a week at bedtime.   Cholecalciferol (VITAMIN D3) 50 MCG (2000 UT) TABS, Take 2,000 Units by mouth daily.   diclofenac sodium (VOLTAREN) 1 % GEL, Apply 1 application topically 3 (three) times daily as needed (for back pain).   docusate sodium  (COLACE) 50 MG capsule, Take 50 mg by mouth daily.   escitalopram  (LEXAPRO ) 5 MG tablet, TAKE 1 TABLET BY MOUTH DAILY*CALL OFFICE TO MAKE APPOINTMENT* (Patient taking  differently: Take 10 mg by mouth daily.)   fluconazole (DIFLUCAN) 200 MG tablet, Take 1 tablet by mouth daily.   gabapentin (NEURONTIN) 100 MG capsule, Take by mouth.   galantamine  (RAZADYNE  ER) 24 MG 24 hr capsule, TAKE 1 CAPSULE BY MOUTH EVERY DAY (Patient taking differently: Take 24 mg by mouth daily with breakfast.)   linaclotide (LINZESS) 72 MCG capsule, Take 72 mcg by mouth every other day.    MACROBID 100 MG capsule, 1 capsule with food Orally twice a day x 7 for 7 days   Multiple Vitamin (MULTIVITAMIN WITH MINERALS) TABS tablet, Take 1 tablet by mouth daily.   MYRBETRIQ 25 MG TB24 tablet, 1 tablet Orally Once a day; Duration: 30 days   NAMENDA  XR 28 MG CP24 24 hr capsule, TAKE 1 CAPSULE(28 MG) BY MOUTH DAILY (Patient taking differently: Take 28 mg by mouth daily.)   nystatin  (MYCOSTATIN /NYSTOP ) powder, Apply 1 Application topically 3 (three) times daily. Apply to affected area for up to 7 days   nystatin  cream (MYCOSTATIN ), Apply 1 application topically 2 (two) times daily as needed (for yeast infection).   nystatin -triamcinolone  ointment (MYCOLOG), Apply 1 application topically 2 (two) times daily. Apply BID for up to 7 days.   polyethylene glycol (MIRALAX  / GLYCOLAX ) 17 g packet, Take 17 g by mouth daily.   valACYclovir (VALTREX) 1000 MG tablet, Take 1,000 mg by mouth 3 (three) times daily.  Medical History:  Past Medical History:  Diagnosis Date   Arthritis    Disturbances of sensation of smell and taste    Essential hypertension    a.  02/2007 Echo: Nl EF, triv AI.   GERD (gastroesophageal reflux disease)    Lichen sclerosus    Lipoma of face    Memory loss    Mixed hyperlipidemia    Osteopenia    Other persistent mental disorders due to conditions classified elsewhere    Tubular adenoma of colon 2017   Allergies:  Allergies  Allergen Reactions   Sulfa Antibiotics Other (See Comments)    Reaction unknown   Denosumab  Rash   Lidocaine Rash     Surgical History:  She   has a past surgical history that includes Tonsilectomy, adenoidectomy, bilateral myringotomy and tubes; Oophorectomy (1988); Cataract extraction, bilateral; and Total hip arthroplasty (Left, 09/01/2015). Family History:  Her family history includes Cancer in her mother; Diabetes in her father; Hypertension in her father.  REVIEW OF SYSTEMS  : All other systems reviewed and negative except where noted in the  History of Present Illness.  PHYSICAL EXAM: BP 128/82   Pulse 72   SpO2 95%  Physical Exam   GENERAL APPEARANCE: Well nourished, in no acute distress HEENT: No cervical lymphadenopathy, unremarkable thyroid , sclerae anicteric, conjunctiva pink RESPIRATORY: Respiratory effort normal, breath sounds clear and equal bilaterally without rales, rhonchi, or wheezing CARDIO: Systolic murmur, irregular rhythm, peripheral pulses intact ABDOMEN: Soft, non-distended, active bowel sounds in all four quadrants, no tenderness to palpation, no rebound, no mass appreciated RECTAL: Declines MUSCULOSKELETAL: Diffuse weakness with standing, full range of motion, normal gait, without edema SKIN: Dry, intact without rashes or lesions. No jaundice. NEURO: Alert, oriented, slight right hand tremor, no focal deficits PSYCH: Cooperative, normal mood and affect.      Alan JONELLE Coombs, PA-C 2:02 PM

## 2024-03-18 NOTE — Patient Instructions (Signed)
 Your provider has requested that you go to the basement level for lab work before leaving today. Press B on the elevator. The lab is located at the first door on the left as you exit the elevator.  Due to recent changes in healthcare laws, you may see the results of your imaging and laboratory studies on MyChart before your provider has had a chance to review them.  We understand that in some cases there may be results that are confusing or concerning to you. Not all laboratory results come back in the same time frame and the provider may be waiting for multiple results in order to interpret others.  Please give us  48 hours in order for your provider to thoroughly review all the results before contacting the office for clarification of your results.   You have been scheduled for a modified barium swallow on Friday, 04/05/24 at 1:00 pm. Please arrive 15 minutes prior to your test for registration. You will go to [facility] (1st Floor) for your appointment. Should you need to cancel or reschedule your appointment, please contact [phone] Margaret [name]) or [phone] Margaret [name]). _____________________________________________________________________ A Modified Barium Swallow Study, or MBS, is a special x-ray that is taken to check swallowing skills. It is carried out by a Marine Scientist and a Warehouse Manager (SLP). During this test, yourmouth, throat, and esophagus, a muscular tube which connects your mouth to your stomach, is checked. The test will help you, your doctor, and the SLP plan what types of foods and liquids are easier for you to swallow. The SLP will also identify positions and ways to help you swallow more easily and safely. What will happen during an MBS? You will be taken to an x-ray room and seated comfortably. You will be asked to swallow small amounts of food and liquid mixed with barium. Barium is a liquid or paste that allows images of your mouth, throat and  esophagus to be seen on x-ray. The x-ray captures moving images of the food you are swallowing as it travels from your mouth through your throat and into your esophagus. This test helps identify whether food or liquid is entering your lungs (aspiration). The test also shows which part of your mouth or throat lacks strength or coordination to move the food or liquid in the right direction. This test typically takes 30 minutes to 1 hour to complete. _______________________________________________________________________    Margaret [name] have been scheduled for an endoscopy. Please follow written instructions given to you at your visit today.  If you use inhalers (even only as needed), please bring them with you on the day of your procedure.  If you take any of the following medications, they will need to be adjusted prior to your procedure:   DO NOT TAKE 7 DAYS PRIOR TO TEST- Trulicity (dulaglutide) Ozempic, Wegovy (semaglutide) Mounjaro, Zepbound (tirzepatide) Bydureon Bcise (exanatide extended release)  DO NOT TAKE 1 DAY PRIOR TO YOUR TEST Rybelsus (semaglutide) Adlyxin (lixisenatide) Victoza (liraglutide) Byetta (exanatide) ___________________________________________________________________________  Dysphagia precautions:  Begin meals with warm beverage  Eat smaller more frequent meals Eat slowly, taking small bites and sips  Alternate solids and liquids Avoid foods/liquids that increase acid production Sit upright during and for 30+ minutes after meals to facilitate esophageal clearing All meats should be chopped finely.   If something gets hung in your esophagus and will not come up or go down, proceed to the emergency room.     Thank you for trusting me with your gastrointestinal  care!   Margaret Coombs, PA-C  _______________________________________________________  If your blood pressure at your visit was 140/90 or greater, please contact your primary care physician to follow up  on this.  _______________________________________________________  If you are age 71 or older, your body mass index should be between 23-30. Your There is no height or weight on file to calculate BMI. If this is out of the aforementioned range listed, please consider follow up with your Primary Care Provider.  If you are age 46 or younger, your body mass index should be between 19-25. Your There is no height or weight on file to calculate BMI. If this is out of the aformentioned range listed, please consider follow up with your Primary Care Provider.   ________________________________________________________  The [facility] providers would like to encourage you to use MYCHART to communicate with providers for non-urgent requests or questions.  Due to [name] hold times on the telephone, sending your provider a message by [facility] may be a faster and more efficient way to get a response.  Please allow 48 business hours for a response.  Please remember that this is for non-urgent requests.  _______________________________________________________  [facility] is using a team-based approach to care.  Your team is made up of your doctor and two to three APPS. Our APPS (Nurse Practitioners and Physician Assistants) work with your physician to ensure care continuity for you. They are fully qualified to address your health concerns and develop a treatment plan. They communicate directly with your gastroenterologist to care for you. Seeing the Advanced Practice Practitioners on your physician's team can help you by facilitating care more promptly, often allowing for earlier appointments, access to diagnostic testing, procedures, and other specialty referrals.

## 2024-03-19 ENCOUNTER — Ambulatory Visit: Payer: Self-pay | Admitting: Physician Assistant

## 2024-04-03 DIAGNOSIS — Z23 Encounter for immunization: Secondary | ICD-10-CM | POA: Diagnosis not present

## 2024-04-05 ENCOUNTER — Ambulatory Visit (HOSPITAL_COMMUNITY)
Admission: RE | Admit: 2024-04-05 | Discharge: 2024-04-05 | Disposition: A | Source: Ambulatory Visit | Attending: Internal Medicine | Admitting: Internal Medicine

## 2024-04-05 ENCOUNTER — Ambulatory Visit: Payer: Self-pay | Admitting: Gastroenterology

## 2024-04-05 ENCOUNTER — Ambulatory Visit (HOSPITAL_COMMUNITY)
Admission: RE | Admit: 2024-04-05 | Discharge: 2024-04-05 | Disposition: A | Source: Ambulatory Visit | Attending: Internal Medicine

## 2024-04-05 DIAGNOSIS — F0280 Dementia in other diseases classified elsewhere without behavioral disturbance: Secondary | ICD-10-CM | POA: Diagnosis not present

## 2024-04-05 DIAGNOSIS — R1310 Dysphagia, unspecified: Secondary | ICD-10-CM | POA: Diagnosis present

## 2024-04-05 DIAGNOSIS — R1319 Other dysphagia: Secondary | ICD-10-CM

## 2024-04-05 DIAGNOSIS — R059 Cough, unspecified: Secondary | ICD-10-CM

## 2024-04-05 DIAGNOSIS — K219 Gastro-esophageal reflux disease without esophagitis: Secondary | ICD-10-CM

## 2024-04-05 DIAGNOSIS — G309 Alzheimer's disease, unspecified: Secondary | ICD-10-CM | POA: Diagnosis not present

## 2024-04-05 NOTE — Evaluation (Signed)
 Modified Barium Swallow Study  Patient Details  Name: Margaret [name] MRN: [id number] Date of Birth: May 13, 1934  Today's Date: 04/05/2024  Modified Barium Swallow completed.  Full report located under Chart Review in the Imaging Section.  History of Present Illness Margaret [name] is an 74 yof who was referred for OP MBS due to worsening dysphagia with liquids and large pills. Esophagram 03/06/24 showed mild dysmotility, smooth tapering at GE junction with lodging of barium pill. EGD 02/09/24 small HH, wide esophageal ring at GE junction; PMHx includes Alzheimer's dz, worsening cognition. [name] She was recently treated with antibiotics for pneumonia, raising concerns about potential aspiration.   Clinical Impression Pt presents with a normal oropharyngeal swallow.  There was good oral control/propulsion through oral cavity, reliable airway protection (no penetration, no aspiration), efficient transfer of all solids and liquids through pharynx.  No residue s/p swallow.  Discussed results with caregiver, Margaret [name], after the study.  Pt may occasionally have coughing episodes that accompany eating.  We would anticipate that swallowing would, at some point, begin to falter with the advancement of her dementia. However, today her physiology is excellent.  Recommend continuing current diet.  No SLP f/u is needed. Factors that may increase risk of adverse event in presence of aspiration Margaret [name] & Margaret [name] 2021):  none  Swallow Evaluation Recommendations Recommendations: PO diet PO Diet Recommendation: Regular;Thin liquids (Level 0) Liquid Administration via: Straw;Cup Medication Administration: Whole meds with liquid Supervision: Patient able to self-feed Oral care recommendations: Oral care BID (2x/day)    Ayman Brull L. Vona, MA CCC/SLP Clinical Specialist - Acute Care SLP Acute  Rehabilitation Services Office number [phone]   Vona Palma Laurice 04/05/2024,3:04 PM

## 2024-04-09 ENCOUNTER — Telehealth: Payer: Self-pay

## 2024-04-09 NOTE — Telephone Encounter (Signed)
 Patient's caregiver Neville called and left a message on the triage line stating that Ms. Oh Lichen sclerosis is back. She is wanting to know if Dr. Nikki could see her this week. Dr. Nikki could you please advise on a time.

## 2024-04-09 NOTE — Telephone Encounter (Signed)
 I can see her on 04/16/24 at 11:30, check in at 11:15.  I will need the procedure room or a life table.   I am sorry that I do not have openings this week.

## 2024-04-09 NOTE — Telephone Encounter (Signed)
 Patient is scheduled with Dr. Nikki in the procedure room on 12/23 at 11:30. Patient instructed to arrive at 11:15

## 2024-04-16 ENCOUNTER — Encounter: Payer: Self-pay | Admitting: Obstetrics and Gynecology

## 2024-04-16 ENCOUNTER — Ambulatory Visit: Admitting: Obstetrics and Gynecology

## 2024-04-16 ENCOUNTER — Other Ambulatory Visit: Payer: Self-pay

## 2024-04-16 ENCOUNTER — Encounter (HOSPITAL_COMMUNITY): Payer: Self-pay | Admitting: Pediatrics

## 2024-04-16 VITALS — BP 112/74 | HR 68

## 2024-04-16 DIAGNOSIS — N763 Subacute and chronic vulvitis: Secondary | ICD-10-CM

## 2024-04-16 LAB — WET PREP FOR TRICH, YEAST, CLUE

## 2024-04-16 NOTE — Pre-Procedure Instructions (Addendum)
 Anesthesia Review:  PCP: DR Elsie Gentry  Cardiologist : none   PPM/ ICD: Device Orders: Rep Notified:  Chest x-ray : EKG : 2022  Echo : Stress test: Cardiac Cath :   Activity level:  Sleep Study/ CPAP : Fasting Blood Sugar :      / Checks Blood Sugar -- times a day:    Blood Thinner/ Instructions /Last Dose: ASA / Instructions/ Last Dose :    03/18/24-labs    PT with Alzheimer's /Dementia PT with very poor short term memory loss.   Has caregiver at home- Neville Seltzer who will be her driver and caregiver.   Caregiver provided med hx and obtained preop instructions from preop nurse.  Voiced understanding.  Pt recognizes family members per caregiver.

## 2024-04-16 NOTE — Progress Notes (Signed)
 Anesthesia Review:  PCP: Cardiologist :  PPM/ ICD: Device Orders: Rep Notified:  Chest x-ray : EKG : 2022  Echo : Stress test: Cardiac Cath :   Activity level:  Sleep Study/ CPAP : Fasting Blood Sugar :      / Checks Blood Sugar -- times a day:    Blood Thinner/ Instructions /Last Dose: ASA / Instructions/ Last Dose :    03/18/24 labs

## 2024-04-16 NOTE — Progress Notes (Signed)
 "  GYNECOLOGY  VISIT   HPI: 89 y.o.   Married  Caucasian female   [phone] with No LMP recorded. Patient is postmenopausal.   here for: Flare up of lichens sclerosus. Looks like it is spreading and skin sticking together at clitoris.  Using cream 3 times a day everyday.  Patient's caregiver is with her today.    Had  UTI.    Just finishes treatment with nitrofurantoin for 7 days.    May have finished last pill yesterday.  Concern about lichen sclerosus and scarring of the labia over her urethra.       GYNECOLOGIC HISTORY: No LMP recorded. Patient is postmenopausal. Contraception:  PMP Menopausal hormone therapy:  n/a Last 2 paps:  05/17/10 neg  History of abnormal Pap or positive HPV:  no Mammogram:  2023        OB History     Gravida  4   Para  3   Term      Preterm      AB  1   Living  3      SAB      IAB      Ectopic      Multiple      Live Births                 Patient Active Problem List   Diagnosis Date Noted   Shingles 01/05/2023   Pain in right foot 06/25/2020   Lumbar radiculopathy 05/04/2020   Chronic low back pain 08/16/2017   Degeneration of lumbar intervertebral disc 08/16/2017   Pain in right knee 06/26/2017   S/P left THA, AA 09/01/2015   Mixed hyperlipidemia    RUQ abdominal pain 02/23/2015   Nausea without vomiting 02/23/2015   Elevated LFTs 02/23/2015   Loss of appetite 02/23/2015   Dementia with behavioral disturbance (HCC) 02/04/2015   Abdominal pain 02/04/2015   Leaking of urine 02/04/2015   Dyspepsia 07/17/2013   Mild cognitive impairment 09/21/2012   Poison ivy dermatitis 08/27/2012   Osteopenia    Lichen sclerosus    ARM PAIN 06/30/2010   Hyperlipidemia 07/19/2008   Essential hypertension 07/19/2008   BRUIT 07/19/2008    Past Medical History:  Diagnosis Date   Arthritis    Disturbances of sensation of smell and taste    Essential hypertension    a.  02/2007 Echo: Nl EF, triv AI.   GERD (gastroesophageal  reflux disease)    Lichen sclerosus    Lipoma of face    Memory loss    Mixed hyperlipidemia    Osteopenia    Other persistent mental disorders due to conditions classified elsewhere    Tubular adenoma of colon 2017    Past Surgical History:  Procedure Laterality Date   CATARACT EXTRACTION, BILATERAL     OOPHORECTOMY  1988   DIAG LAP W BSO   TONSILECTOMY, ADENOIDECTOMY, BILATERAL MYRINGOTOMY AND TUBES     TOTAL HIP ARTHROPLASTY Left 09/01/2015   Procedure: LEFT TOTAL HIP ARTHROPLASTY ANTERIOR APPROACH;  Surgeon: Donnice Car, MD;  Location: [facility];  Service: Orthopedics;  Laterality: Left;    Current Outpatient Medications  Medication Sig Dispense Refill   acetaminophen  (TYLENOL  8 HOUR) 650 MG CR tablet Take 1,300 mg by mouth every 8 (eight) hours as needed for pain.      amLODipine  (NORVASC ) 5 MG tablet Take 1 tablet by mouth daily.     betamethasone  valerate ointment (VALISONE ) 0.1 % Apply 1 Application topically 2 (two)  times daily. Place in a thin layer to skin twice daily for 2 weeks and then use twice a week at bedtime. 45 g 0   buprenorphine  (BUTRANS ) 20 MCG/HR PTWK 1 patch once a week.     celecoxib  (CELEBREX ) 100 MG capsule Take 100 mg by mouth.     cetirizine  (ZYRTEC ) 10 MG tablet Take by mouth.     Cholecalciferol  (VITAMIN D3) 50 MCG (2000 UT) TABS Take 2,000 Units by mouth daily.     diclofenac sodium (VOLTAREN) 1 % GEL Apply 1 application topically 3 (three) times daily as needed (for back pain).     docusate sodium  (COLACE) 50 MG capsule Take 50 mg by mouth daily.     escitalopram  (LEXAPRO ) 5 MG tablet TAKE 1 TABLET BY MOUTH DAILY*CALL OFFICE TO MAKE APPOINTMENT* (Patient taking differently: Take 10 mg by mouth daily.) 30 tablet 0   fluticasone  (FLONASE ) 50 MCG/ACT nasal spray Place 1 spray into both nostrils daily as needed for allergies or rhinitis.     gabapentin  (NEURONTIN ) 100 MG capsule Take by mouth.     galantamine  (RAZADYNE  ER) 24 MG 24 hr capsule TAKE 1 CAPSULE  BY MOUTH EVERY DAY (Patient taking differently: Take 24 mg by mouth daily with breakfast.) 90 capsule 1   ipratropium (ATROVENT ) 0.03 % nasal spray 2 sprays.     linaclotide (LINZESS) 72 MCG capsule Take 72 mcg by mouth every other day.      MACROBID 100 MG capsule 1 capsule with food Orally twice a day x 7 for 7 days     Multiple Vitamin (MULTIVITAMIN WITH MINERALS) TABS tablet Take 1 tablet by mouth daily.     MYRBETRIQ 25 MG TB24 tablet 1 tablet Orally Once a day; Duration: 30 days     NAMENDA  XR 28 MG CP24 24 hr capsule TAKE 1 CAPSULE(28 MG) BY MOUTH DAILY (Patient taking differently: Take 28 mg by mouth daily.) 30 capsule 5   nystatin  (MYCOSTATIN /NYSTOP ) powder Apply 1 Application topically 3 (three) times daily. Apply to affected area for up to 7 days 30 g 2   nystatin  cream (MYCOSTATIN ) Apply 1 application topically 2 (two) times daily as needed (for yeast infection).     nystatin -triamcinolone  ointment (MYCOLOG) Apply 1 application topically 2 (two) times daily. Apply BID for up to 7 days. 60 g 0   olmesartan (BENICAR) 20 MG tablet Take 40 mg by mouth daily.      polyethylene glycol (MIRALAX  / GLYCOLAX ) 17 g packet Take 17 g by mouth daily.     valACYclovir (VALTREX) 1000 MG tablet Take 1,000 mg by mouth 3 (three) times daily.     fluconazole (DIFLUCAN) 200 MG tablet Take 1 tablet by mouth daily. (Patient not taking: Reported on 04/16/2024)     No current facility-administered medications for this visit.     ALLERGIES: Amoxicillin -pot clavulanate, Sulfa antibiotics, Denosumab , and Lidocaine   Family History  Problem Relation Age of Onset   Cancer Mother        OVARIAN   Diabetes Father    Hypertension Father     Social History   Socioeconomic History   Marital status: Married    Spouse name: Not on file   Number of children: 3   Years of education: Not on file   Highest education level: Not on file  Occupational History   Occupation: retired travel agent    Comment: full  time  Tobacco Use   Smoking status: Former    Current packs/day: 0.00  Types: Cigarettes    Quit date: 09/13/2002    Years since quitting: 21.6   Smokeless tobacco: Never  Vaping Use   Vaping status: Never Used  Substance and Sexual Activity   Alcohol  use: Yes    Alcohol /week: 21.0 standard drinks of alcohol     Types: 14 Glasses of wine, 7 Standard drinks or equivalent per week    Comment: 2 glasses of wine each evening   Drug use: No   Sexual activity: Not Currently    Birth control/protection: Abstinence, Post-menopausal  Other Topics Concern   Not on file  Social History Narrative   This patient is widowed. She lives alone  in a single level  home in [place], Personal Care Services from [employer] [facility] to assist with meals and transportation.    Patient is retired firefighter.    Main exercise is walking, she has a development worker, international aid.   Stopped smoking 20 years ago, drinks 1-2 glasses of wine a  day   Patient has 2 sons and one daughter, all live out of town.   Has a Living Will, DO NOT RESUSCITATE.   Social Drivers of Health   Tobacco Use: Medium Risk (04/16/2024)   Patient History    Smoking Tobacco Use: Former    Smokeless Tobacco Use: Never    Passive Exposure: Not on Actuary Strain: Not on file  Food Insecurity: Not on file  Transportation Needs: Not on file  Physical Activity: Not on file  Stress: Not on file  Social Connections: Not on file  Intimate Partner Violence: Not on file  Depression (EYV7-0): Not on file  Alcohol  Screen: Not on file  Housing: Not on file  Utilities: Not on file  Health Literacy: Not on file    Review of Systems  All other systems reviewed and are negative.   PHYSICAL EXAMINATION:   BP 112/74 (BP Location: Right Arm, Patient Position: Sitting)   Pulse 68   SpO2 95%     General appearance: alert, cooperative and appears stated age   Pelvic: External genitalia:  hypopigmentation change and  agglutination of the superior labia minora to each other.               Urethra:  normal appearing urethra with no masses, tenderness or lesions.  Urethra identified and appears normal.               Bartholins and Skenes: normal                 Vagina: normal appearing vagina with normal color and discharge, no lesions              Cervix:  not visualized.                 Bimanual Exam:  Uterus:  normal size, contour, position, consistency, mobility, non-tender              Adnexa: no mass, fullness, tenderness    Chaperone was present for exam:  Kari HERO, CMA  ASSESSMENT:  Vulvitis.  Likely lichen sclerosus.    PLAN:  Wet prep:  negative for yeast, clue cells, and trichomonas.  Use Valisone  twice daily for 1 month and then twice weekly.  Keep routine breast and pelvic exam in January.    24 min  total time was spent for this patient encounter, including preparation, face-to-face counseling with the patient, coordination of care, and documentation of the encounter.    "

## 2024-04-18 ENCOUNTER — Inpatient Hospital Stay (HOSPITAL_COMMUNITY)
Admission: EM | Admit: 2024-04-18 | Discharge: 2024-04-22 | DRG: 481 | Disposition: A | Discharge status: Home Health | Attending: Student | Admitting: Student

## 2024-04-18 ENCOUNTER — Emergency Department (HOSPITAL_COMMUNITY)

## 2024-04-18 ENCOUNTER — Other Ambulatory Visit: Payer: Self-pay

## 2024-04-18 ENCOUNTER — Encounter (HOSPITAL_COMMUNITY): Payer: Self-pay

## 2024-04-18 DIAGNOSIS — F02C18 Dementia in other diseases classified elsewhere, severe, with other behavioral disturbance: Secondary | ICD-10-CM | POA: Diagnosis present

## 2024-04-18 DIAGNOSIS — Z79899 Other long term (current) drug therapy: Secondary | ICD-10-CM

## 2024-04-18 DIAGNOSIS — Z884 Allergy status to anesthetic agent status: Secondary | ICD-10-CM | POA: Diagnosis not present

## 2024-04-18 DIAGNOSIS — N3281 Overactive bladder: Secondary | ICD-10-CM | POA: Insufficient documentation

## 2024-04-18 DIAGNOSIS — F3340 Major depressive disorder, recurrent, in remission, unspecified: Secondary | ICD-10-CM | POA: Diagnosis present

## 2024-04-18 DIAGNOSIS — W19XXXA Unspecified fall, initial encounter: Principal | ICD-10-CM

## 2024-04-18 DIAGNOSIS — I10 Essential (primary) hypertension: Secondary | ICD-10-CM | POA: Diagnosis present

## 2024-04-18 DIAGNOSIS — M810 Age-related osteoporosis without current pathological fracture: Secondary | ICD-10-CM | POA: Diagnosis present

## 2024-04-18 DIAGNOSIS — W010XXA Fall on same level from slipping, tripping and stumbling without subsequent striking against object, initial encounter: Secondary | ICD-10-CM | POA: Diagnosis present

## 2024-04-18 DIAGNOSIS — E7800 Pure hypercholesterolemia, unspecified: Secondary | ICD-10-CM | POA: Diagnosis not present

## 2024-04-18 DIAGNOSIS — G309 Alzheimer's disease, unspecified: Secondary | ICD-10-CM | POA: Diagnosis present

## 2024-04-18 DIAGNOSIS — D72829 Elevated white blood cell count, unspecified: Secondary | ICD-10-CM | POA: Diagnosis present

## 2024-04-18 DIAGNOSIS — Z87891 Personal history of nicotine dependence: Secondary | ICD-10-CM | POA: Diagnosis not present

## 2024-04-18 DIAGNOSIS — Y92002 Bathroom of unspecified non-institutional (private) residence single-family (private) house as the place of occurrence of the external cause: Secondary | ICD-10-CM | POA: Diagnosis not present

## 2024-04-18 DIAGNOSIS — Z833 Family history of diabetes mellitus: Secondary | ICD-10-CM

## 2024-04-18 DIAGNOSIS — Z66 Do not resuscitate: Secondary | ICD-10-CM | POA: Diagnosis present

## 2024-04-18 DIAGNOSIS — F03918 Unspecified dementia, unspecified severity, with other behavioral disturbance: Secondary | ICD-10-CM | POA: Diagnosis not present

## 2024-04-18 DIAGNOSIS — K219 Gastro-esophageal reflux disease without esophagitis: Secondary | ICD-10-CM | POA: Diagnosis present

## 2024-04-18 DIAGNOSIS — Z96642 Presence of left artificial hip joint: Secondary | ICD-10-CM | POA: Diagnosis present

## 2024-04-18 DIAGNOSIS — S72001A Fracture of unspecified part of neck of right femur, initial encounter for closed fracture: Secondary | ICD-10-CM | POA: Diagnosis present

## 2024-04-18 DIAGNOSIS — E559 Vitamin D deficiency, unspecified: Secondary | ICD-10-CM | POA: Diagnosis present

## 2024-04-18 DIAGNOSIS — Z7982 Long term (current) use of aspirin: Secondary | ICD-10-CM

## 2024-04-18 DIAGNOSIS — Z882 Allergy status to sulfonamides status: Secondary | ICD-10-CM | POA: Diagnosis not present

## 2024-04-18 DIAGNOSIS — L89151 Pressure ulcer of sacral region, stage 1: Secondary | ICD-10-CM | POA: Diagnosis present

## 2024-04-18 DIAGNOSIS — M5416 Radiculopathy, lumbar region: Secondary | ICD-10-CM | POA: Diagnosis present

## 2024-04-18 DIAGNOSIS — Z880 Allergy status to penicillin: Secondary | ICD-10-CM

## 2024-04-18 DIAGNOSIS — Z8249 Family history of ischemic heart disease and other diseases of the circulatory system: Secondary | ICD-10-CM

## 2024-04-18 DIAGNOSIS — E785 Hyperlipidemia, unspecified: Secondary | ICD-10-CM | POA: Diagnosis present

## 2024-04-18 DIAGNOSIS — L570 Actinic keratosis: Secondary | ICD-10-CM | POA: Insufficient documentation

## 2024-04-18 DIAGNOSIS — Z1152 Encounter for screening for COVID-19: Secondary | ICD-10-CM

## 2024-04-18 DIAGNOSIS — E782 Mixed hyperlipidemia: Secondary | ICD-10-CM | POA: Diagnosis present

## 2024-04-18 DIAGNOSIS — Z888 Allergy status to other drugs, medicaments and biological substances status: Secondary | ICD-10-CM | POA: Diagnosis not present

## 2024-04-18 DIAGNOSIS — G250 Essential tremor: Secondary | ICD-10-CM | POA: Insufficient documentation

## 2024-04-18 LAB — URINALYSIS, ROUTINE W REFLEX MICROSCOPIC
Bilirubin Urine: NEGATIVE
Glucose, UA: NEGATIVE mg/dL
Hgb urine dipstick: NEGATIVE
Ketones, ur: NEGATIVE mg/dL
Leukocytes,Ua: NEGATIVE
Nitrite: NEGATIVE
Protein, ur: NEGATIVE mg/dL
Specific Gravity, Urine: 1.005 (ref 1.005–1.030)
pH: 8 (ref 5.0–8.0)

## 2024-04-18 LAB — CBC
HCT: 41.6 % (ref 36.0–46.0)
Hemoglobin: 13.6 g/dL (ref 12.0–15.0)
MCH: 32.8 pg (ref 26.0–34.0)
MCHC: 32.7 g/dL (ref 30.0–36.0)
MCV: 100.2 fL — ABNORMAL HIGH (ref 80.0–100.0)
Platelets: 307 K/uL (ref 150–400)
RBC: 4.15 MIL/uL (ref 3.87–5.11)
RDW: 12.9 % (ref 11.5–15.5)
WBC: 24.7 K/uL — ABNORMAL HIGH (ref 4.0–10.5)
nRBC: 0 % (ref 0.0–0.2)

## 2024-04-18 LAB — BASIC METABOLIC PANEL WITH GFR
Anion gap: 9 (ref 5–15)
BUN: 10 mg/dL (ref 8–23)
CO2: 27 mmol/L (ref 22–32)
Calcium: 9.3 mg/dL (ref 8.9–10.3)
Chloride: 103 mmol/L (ref 98–111)
Creatinine, Ser: 0.44 mg/dL (ref 0.44–1.00)
GFR, Estimated: >60 mL/min
Glucose, Bld: 110 mg/dL — ABNORMAL HIGH (ref 70–99)
Potassium: 3.6 mmol/L (ref 3.5–5.1)
Sodium: 139 mmol/L (ref 135–145)

## 2024-04-18 LAB — APTT: aPTT: 29 s (ref 24–36)

## 2024-04-18 LAB — PROTIME-INR
INR: 1 (ref 0.8–1.2)
Prothrombin Time: 13.4 s (ref 11.4–15.2)

## 2024-04-18 MED ORDER — CEFAZOLIN SODIUM-DEXTROSE 2-4 GM/100ML-% IV SOLN
2.0000 g | INTRAVENOUS | Status: AC
  Administered 2024-04-19: 2 g via INTRAVENOUS
  Filled 2024-04-18: qty 100

## 2024-04-18 MED ORDER — GABAPENTIN 100 MG PO CAPS: 200.0000 mg | ORAL_CAPSULE | ORAL | Status: DC

## 2024-04-18 MED ORDER — ACETAMINOPHEN 325 MG PO TABS
650.0000 mg | ORAL_TABLET | Freq: Once | ORAL | Status: AC
  Administered 2024-04-18: 650 mg via ORAL
  Filled 2024-04-18: qty 2

## 2024-04-18 MED ORDER — GABAPENTIN 100 MG PO CAPS
200.0000 mg | ORAL_CAPSULE | Freq: Every day | ORAL | Status: DC
  Administered 2024-04-18 – 2024-04-21 (×4): 200 mg via ORAL
  Filled 2024-04-18 (×4): qty 2

## 2024-04-18 MED ORDER — DOCUSATE SODIUM 50 MG PO CAPS
50.0000 mg | ORAL_CAPSULE | Freq: Two times a day (BID) | ORAL | Status: DC
  Administered 2024-04-18: 50 mg via ORAL
  Filled 2024-04-18 (×2): qty 1

## 2024-04-18 MED ORDER — OXYCODONE HCL 5 MG PO TABS
5.0000 mg | ORAL_TABLET | ORAL | Status: DC | PRN
  Administered 2024-04-19 – 2024-04-20 (×3): 5 mg via ORAL
  Filled 2024-04-18 (×3): qty 1

## 2024-04-18 MED ORDER — CELECOXIB 100 MG PO CAPS
100.0000 mg | ORAL_CAPSULE | Freq: Two times a day (BID) | ORAL | Status: DC
  Administered 2024-04-18 – 2024-04-21 (×7): 100 mg via ORAL
  Filled 2024-04-18 (×7): qty 1

## 2024-04-18 MED ORDER — OXYCODONE HCL 5 MG PO TABS: 2.5000 mg | ORAL_TABLET | ORAL | Status: DC | PRN

## 2024-04-18 MED ORDER — MORPHINE SULFATE (PF) 2 MG/ML IV SOLN: 2.0000 mg | INTRAVENOUS | Status: DC | PRN

## 2024-04-18 MED ORDER — POVIDONE-IODINE 7.5 % EX SOLN
Freq: Once | CUTANEOUS | Status: DC
  Filled 2024-04-18 (×2): qty 118

## 2024-04-18 MED ORDER — VITAMIN D 25 MCG (1000 UNIT) PO TABS
2000.0000 [IU] | ORAL_TABLET | Freq: Every day | ORAL | Status: DC
  Administered 2024-04-19 – 2024-04-21 (×3): 2000 [IU] via ORAL
  Filled 2024-04-18 (×3): qty 2

## 2024-04-18 MED ORDER — MUPIROCIN 2 % EX OINT
1.0000 | TOPICAL_OINTMENT | Freq: Two times a day (BID) | CUTANEOUS | Status: DC
  Administered 2024-04-18 – 2024-04-21 (×6): 1 via NASAL
  Filled 2024-04-18 (×2): qty 22

## 2024-04-18 MED ORDER — GALANTAMINE HYDROBROMIDE ER 8 MG PO CP24
16.0000 mg | ORAL_CAPSULE | Freq: Every day | ORAL | Status: DC
  Administered 2024-04-19 – 2024-04-21 (×3): 16 mg via ORAL
  Filled 2024-04-18 (×4): qty 2

## 2024-04-18 MED ORDER — FLUTICASONE PROPIONATE 50 MCG/ACT NA SUSP
1.0000 | Freq: Every day | NASAL | Status: DC
  Administered 2024-04-19 – 2024-04-21 (×3): 1 via NASAL
  Filled 2024-04-18: qty 16

## 2024-04-18 MED ORDER — GABAPENTIN 100 MG PO CAPS
200.0000 mg | ORAL_CAPSULE | Freq: Every day | ORAL | Status: DC
  Administered 2024-04-19 – 2024-04-21 (×3): 200 mg via ORAL
  Filled 2024-04-18 (×3): qty 2

## 2024-04-18 MED ORDER — AMLODIPINE BESYLATE 5 MG PO TABS
5.0000 mg | ORAL_TABLET | Freq: Every day | ORAL | Status: DC
  Administered 2024-04-19 – 2024-04-21 (×2): 5 mg via ORAL
  Filled 2024-04-18 (×3): qty 1

## 2024-04-18 MED ORDER — GABAPENTIN 300 MG PO CAPS
300.0000 mg | ORAL_CAPSULE | Freq: Every day | ORAL | Status: DC
  Administered 2024-04-19 – 2024-04-21 (×3): 300 mg via ORAL
  Filled 2024-04-18 (×3): qty 1

## 2024-04-18 MED ORDER — ONDANSETRON HCL 4 MG/2ML IJ SOLN: 4.0000 mg | Freq: Four times a day (QID) | INTRAMUSCULAR | Status: DC | PRN

## 2024-04-18 MED ORDER — ACETAMINOPHEN 500 MG PO TABS
1000.0000 mg | ORAL_TABLET | Freq: Three times a day (TID) | ORAL | Status: DC
  Administered 2024-04-18 – 2024-04-22 (×10): 1000 mg via ORAL
  Filled 2024-04-18 (×9): qty 2

## 2024-04-18 MED ORDER — ESCITALOPRAM OXALATE 10 MG PO TABS
10.0000 mg | ORAL_TABLET | Freq: Every day | ORAL | Status: DC
  Administered 2024-04-19 – 2024-04-21 (×3): 10 mg via ORAL
  Filled 2024-04-18 (×3): qty 1

## 2024-04-18 MED ORDER — IRBESARTAN 150 MG PO TABS
150.0000 mg | ORAL_TABLET | Freq: Every day | ORAL | Status: DC
  Administered 2024-04-19: 150 mg via ORAL
  Filled 2024-04-18 (×3): qty 1

## 2024-04-18 MED ORDER — IPRATROPIUM BROMIDE 0.03 % NA SOLN
2.0000 | Freq: Two times a day (BID) | NASAL | Status: DC
  Administered 2024-04-18 – 2024-04-21 (×7): 2 via NASAL
  Filled 2024-04-18: qty 30

## 2024-04-18 MED ORDER — MEMANTINE HCL ER 28 MG PO CP24
28.0000 mg | ORAL_CAPSULE | Freq: Every day | ORAL | Status: DC
  Administered 2024-04-19 – 2024-04-21 (×3): 28 mg via ORAL
  Filled 2024-04-18 (×3): qty 1

## 2024-04-18 MED ORDER — ONDANSETRON HCL 4 MG PO TABS: 4.0000 mg | ORAL_TABLET | Freq: Four times a day (QID) | ORAL | Status: DC | PRN

## 2024-04-18 MED ORDER — POLYETHYLENE GLYCOL 3350 17 G PO PACK
17.0000 g | PACK | Freq: Every day | ORAL | Status: DC
  Administered 2024-04-20: 17 g via ORAL
  Filled 2024-04-18 (×2): qty 1

## 2024-04-18 MED ORDER — TRANEXAMIC ACID-NACL 1000-0.7 MG/100ML-% IV SOLN
1000.0000 mg | INTRAVENOUS | Status: AC
  Administered 2024-04-19: 1000 mg via INTRAVENOUS
  Filled 2024-04-18: qty 100

## 2024-04-18 NOTE — ED Notes (Signed)
 Sacral Pad placed for preventative measures.

## 2024-04-18 NOTE — H&P (Signed)
 " History and Physical    Patient: Margaret [name] FMW:[id number] DOB: 07/28/1934 DOA: 04/18/2024 DOS: the patient was seen and examined on 04/18/2024 PCP: Loreli Elsie JONETTA Mickey., MD  Patient coming from: Home  Chief Complaint:  Chief Complaint  Patient presents with   Fall   HPI: Margaret [name] is a 89 y.o. female with medical history significant of osteoarthritis, disturbance of smell and taste, hypertension, GERD, lichen sclerosis, facial lipoma, mixed hyperlipidemia, osteopenia, scoliosis, tubular adenoma of the colon herpes zoster, chronic back pain, Alzheimer's dementia with behavioral disturbance who had a mechanical fall at home (slipped on the floor while going to the bathroom) witnessed by her caretaker landing on her right hip area developing immediate pain and inability to bear weight on her RLE.  No prodromal symptoms.  No headaches, chest, or abdominal pain at this time.  No significant back pain.  Lab work: CBC showed white count of 24.7, hemoglobin 13.6 g/dL with an MCV of 899.7 fL and platelets 307.  Normal PT, INR and PTT.  Unremarkable BMP with the assassin of a glucose of 110 mg/dL.  Imaging: CT pelvis without contrast showing chronic fracture deformity involving the junction head and neck of the proximal right femur with an additional small nondisplaced acute fracture involving the right femoral neck.  Total left hip replacement.  Moderate severity degenerative changes within the right hip and visualized portion of the lower lumbar spine.  Seemingly diverticulosis.  Aortic atherosclerosis.   ED course: Initial vital signs were temperature 98 F, pulse 63, respiration 15, BP 164/62 mmHg and O2 sat 100% on room air.  The patient was started on oxycodone  as needed and given acetaminophen  650 mg p.o. x 1 dose.  Review of Systems: As mentioned in the history of present illness. All other systems reviewed and are negative. Past Medical History:  Diagnosis Date   Arthritis     Disturbances of sensation of smell and taste    Essential hypertension    a.  02/2007 Echo: Nl EF, triv AI.   GERD (gastroesophageal reflux disease)    Lichen sclerosus    Lipoma of face    Memory loss    Mixed hyperlipidemia    Osteopenia    Other persistent mental disorders due to conditions classified elsewhere    Scoliosis    Tubular adenoma of colon 2017   Past Surgical History:  Procedure Laterality Date   CATARACT EXTRACTION, BILATERAL     OOPHORECTOMY  1988   DIAG LAP W BSO   TONSILECTOMY, ADENOIDECTOMY, BILATERAL MYRINGOTOMY AND TUBES     TOTAL HIP ARTHROPLASTY Left 09/01/2015   Procedure: LEFT TOTAL HIP ARTHROPLASTY ANTERIOR APPROACH;  Surgeon: Donnice Car, MD;  Location: [facility];  Service: Orthopedics;  Laterality: Left;   Social History:  reports that she quit smoking about 21 years ago. Her smoking use included cigarettes. She has never used smokeless tobacco. She reports that she does not currently use alcohol  after a past usage of about 21.0 standard drinks of alcohol  per week. She reports that she does not use drugs.  Allergies[1]  Family History  Problem Relation Age of Onset   Cancer Mother        OVARIAN   Diabetes Father    Hypertension Father     Prior to Admission medications  Medication Sig Start Date End Date Taking? Authorizing Provider  acetaminophen  (TYLENOL  8 HOUR) 650 MG CR tablet Take 1,300 mg by mouth every 8 (eight) hours as needed for pain.  [provider]  amLODipine  (NORVASC ) 5 MG tablet Take 1 tablet by mouth daily. 11/26/19   [provider]  betamethasone  valerate ointment (VALISONE ) 0.1 % Apply 1 Application topically 2 (two) times daily. Place in a thin layer to skin twice daily for 2 weeks and then use twice a week at bedtime. 01/05/23   Cathlyn JAYSON Nikki Bobie FORBES, MD  buprenorphine  (BUTRANS ) 20 MCG/HR PTWK 1 patch once a week.    [provider]  celecoxib  (CELEBREX ) 100 MG capsule Take 100 mg by mouth.  02/28/24 05/28/24  [provider]  cetirizine  (ZYRTEC ) 10 MG tablet Take by mouth. 09/17/14   [provider]  Cholecalciferol  (VITAMIN D3) 50 MCG (2000 UT) TABS Take 2,000 Units by mouth daily.    [provider]  diclofenac sodium (VOLTAREN) 1 % GEL Apply 1 application topically 3 (three) times daily as needed (for back pain).    [provider]  docusate sodium  (COLACE) 50 MG capsule Take 50 mg by mouth daily.    [provider]  escitalopram  (LEXAPRO ) 5 MG tablet TAKE 1 TABLET BY MOUTH DAILY*CALL OFFICE TO MAKE APPOINTMENT* Patient taking differently: Take 10 mg by mouth daily. 05/29/15   Reed, Tiffany L, DO  fluconazole (DIFLUCAN) 200 MG tablet Take 1 tablet by mouth daily. Patient not taking: Reported on 04/16/2024 07/28/22   [provider]  fluticasone  (FLONASE ) 50 MCG/ACT nasal spray Place 1 spray into both nostrils daily as needed for allergies or rhinitis.    [provider]  gabapentin  (NEURONTIN ) 100 MG capsule Take by mouth.    [provider]  galantamine  (RAZADYNE  ER) 24 MG 24 hr capsule TAKE 1 CAPSULE BY MOUTH EVERY DAY Patient taking differently: Take 24 mg by mouth daily with breakfast. 01/05/15   Reed, Tiffany L, DO  ipratropium (ATROVENT ) 0.03 % nasal spray 2 sprays. 02/07/24   [provider]  linaclotide (LINZESS) 72 MCG capsule Take 72 mcg by mouth every other day.     [provider]  MACROBID 100 MG capsule 1 capsule with food Orally twice a day x 7 for 7 days 01/03/23   [provider]  Multiple Vitamin (MULTIVITAMIN WITH MINERALS) TABS tablet Take 1 tablet by mouth daily.    [provider]  MYRBETRIQ 25 MG TB24 tablet 1 tablet Orally Once a day; Duration: 30 days    [provider]  NAMENDA  XR 28 MG CP24 24 hr capsule TAKE 1 CAPSULE(28 MG) BY MOUTH DAILY Patient taking differently: Take 28 mg by mouth daily. 01/28/15   Reed, Tiffany L, DO  nystatin   (MYCOSTATIN /NYSTOP ) powder Apply 1 Application topically 3 (three) times daily. Apply to affected area for up to 7 days 07/27/22   Amundson C Silva, Brook E, MD  nystatin  cream (MYCOSTATIN ) Apply 1 application topically 2 (two) times daily as needed (for yeast infection).    [provider]  nystatin -triamcinolone  ointment (MYCOLOG) Apply 1 application topically 2 (two) times daily. Apply BID for up to 7 days. 11/25/20   Amundson C Silva, Bobie FORBES, MD  olmesartan (BENICAR) 20 MG tablet Take 40 mg by mouth daily.     [provider]  polyethylene glycol (MIRALAX  / GLYCOLAX ) 17 g packet Take 17 g by mouth daily.    [provider]  valACYclovir (VALTREX) 1000 MG tablet Take 1,000 mg by mouth 3 (three) times daily. 11/18/22   [provider]    Physical Exam: Vitals:   04/18/24 1030 04/18/24 1100  04/18/24 1200 04/18/24 1430  BP:  (!) 165/73 (!) 147/66 (!) 156/62  Pulse:  65 62 63  Resp:  14 14 12   Temp:    97.7 F (36.5 C)  TempSrc:    Oral  SpO2:  96% 93% 91%  Weight: 53.5 kg     Height: 4' 11 (1.499 m)      Physical Exam Vitals and nursing note reviewed.  Constitutional:      General: She is awake. She is not in acute distress.    Appearance: She is ill-appearing.  HENT:     Head: Normocephalic.     Nose: No rhinorrhea.     Mouth/Throat:     Mouth: Mucous membranes are moist.  Eyes:     General: No scleral icterus.    Pupils: Pupils are equal, round, and reactive to light.  Neck:     Vascular: No JVD.  Cardiovascular:     Rate and Rhythm: Normal rate and regular rhythm.     Heart sounds: S1 normal and S2 normal.  Pulmonary:     Effort: Pulmonary effort is normal.     Breath sounds: Normal breath sounds. No wheezing, rhonchi or rales.  Abdominal:     General: Bowel sounds are normal. There is no distension.     Palpations: Abdomen is soft.     Tenderness: There is no abdominal tenderness. There is no right CVA tenderness or left CVA  tenderness.  Musculoskeletal:     Cervical back: Neck supple.     Right hip: Tenderness present. Decreased range of motion.     Right lower leg: No edema.     Left lower leg: No edema.  Skin:    General: Skin is warm and dry.     Findings: Bruising present.  Neurological:     General: No focal deficit present.     Mental Status: She is alert. Mental status is at baseline.  Psychiatric:        Mood and Affect: Mood normal.        Behavior: Behavior normal. Behavior is cooperative.     Data Reviewed:  Results are pending, will review when available.  EKG: Vent. rate 65 BPM PR interval * ms QRS duration 49 ms QT/QTcB 544/566 ms P-R-T axes * 42 57 Sinus rhythm Borderline abnrm T, anterolateral leads Prolonged QT interval  Assessment and Plan: Principal Problem:   Closed right hip fracture (HCC) Admit to telemetry/inpatient. Ice area as needed. Buck's traction per protocol. Analgesics as needed. Antiemetics as needed. Consult TOC team. Consult nutritional services. PT evaluation after surgery. Orthopedic surgery evaluation appreciated.  Active Problems:   Essential hypertension Continue amlodipine  5 mg p.o. daily. Continue olmesartan 20 mg p.o. daily or formulary equivalent.    Dementia with behavioral disturbance (HCC) Continue memantine  28 mg p.o. daily. Continue galantamine  16 mg p.o. daily.    Lumbar radiculopathy Analgesics as needed.    Vitamin D  deficiency Continue vitamin D3 supplementation.    Recurrent major depression in remission Continue Lexapro  10 mg p.o. daily.    Hyperlipidemia Currently not on therapy. Follow-up with PCP.    Advance Care Planning:   Code Status: Limited: Do not attempt resuscitation (DNR) -DNR-LIMITED -Do Not Intubate/DNI    Consults: Orthopedic surgery Valerie Pizza, MD).  Family Communication:   Severity of Illness: The appropriate patient status for this patient is INPATIENT. Inpatient status is judged to be  reasonable and necessary in order to provide the required intensity of service to  ensure the patient's safety. The patient's presenting symptoms, physical exam findings, and initial radiographic and laboratory data in the context of their chronic comorbidities is felt to place them at high risk for further clinical deterioration. Furthermore, it is not anticipated that the patient will be medically stable for discharge from the hospital within 2 midnights of admission.   * I certify that at the point of admission it is my clinical judgment that the patient will require inpatient hospital care spanning beyond 2 midnights from the point of admission due to high intensity of service, high risk for further deterioration and high frequency of surveillance required.*  Author: Alm Dorn Castor, MD 04/18/2024 2:59 PM  For on call review [email or web address].   This document was prepared using Dragon voice recognition software and may contain some unintended transcription errors.     [1]  Allergies Allergen Reactions   Amoxicillin -Pot Clavulanate     Other Reaction(s): severe diarrhea in 2022   Sulfa Antibiotics Other (See Comments)    Reaction unknown   Denosumab  Rash   Lidocaine  Rash   "

## 2024-04-18 NOTE — ED Triage Notes (Signed)
 Pt BIBA from home, c/o fall this morning. Denies LOC, blood thinners, head strike. Hx of dementia. VSS.

## 2024-04-18 NOTE — Discharge Instructions (Addendum)
 Orthopedic surgery discharge instructions:  Bandages: -Maintain postoperative bandage until follow-up appointment.  - Do not use any lotions or creams on or around the incision until instructed by your surgeon.  Showering -  This is waterproof, and you may begin showering on postoperative day #3. You should keep the bandage in place wrap it in plastic wrap at that point prior to showering  - Do not submerge the surgical site underwater.    Activity Restrictions - Weight bearing as tolerated right lower extremity  Medications - For mild to moderate pain use Tylenol  as needed around-the-clock, 1000 mg every 8 h for pain. Do not take more than this higher levels can affect kidney function - For breakthrough pain use oxycodone  as necessary. - Please take a stool softener such as docusate and senna while taking a narcotic pain medication - Aspirin  81 mg twice daily x 6 weeks to avoid blood clots  Follow Up:  -You will return to see Dr. Teresa in the office in 2 weeks for routine postoperative check with x-rays. Please call to make the appointment.

## 2024-04-18 NOTE — Consult Note (Signed)
 "  ORTHOPAEDIC CONSULTATION  REQUESTING PHYSICIAN: Trifan, Donnice PARAS, MD  PCP:  Loreli Elsie JONETTA Mickey., MD  Chief Complaint: right hip pain  HPI: Margaret [name] is a 89 y.o. female who complains of  right hip pain s/p fall. The patient has dementia and has a full time caregiver who is present and has provided the history for the patient along with the patient's daughter Darice Canton.  The patient had immediate pain s/p the fall with inability to bear weight.  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  She has had no numbness or tingling of the right lower extremity. She has HLD, HTN, osteoporosis, scoliosis and dementia. With regard to surgical history, she underwent a left total hip arthroplasty with Dr. Ernie 8 years ago.  The patient has some right hip pain, but has managed it with OTC medications.  Past Medical History:  Diagnosis Date   Arthritis    Disturbances of sensation of smell and taste    Essential hypertension    a.  02/2007 Echo: Nl EF, triv AI.   GERD (gastroesophageal reflux disease)    Lichen sclerosus    Lipoma of face    Memory loss    Mixed hyperlipidemia    Osteopenia    Other persistent mental disorders due to conditions classified elsewhere    Scoliosis    Tubular adenoma of colon 2017   Past Surgical History:  Procedure Laterality Date   CATARACT EXTRACTION, BILATERAL     OOPHORECTOMY  1988   DIAG LAP W BSO   TONSILECTOMY, ADENOIDECTOMY, BILATERAL MYRINGOTOMY AND TUBES     TOTAL HIP ARTHROPLASTY Left 09/01/2015   Procedure: LEFT TOTAL HIP ARTHROPLASTY ANTERIOR APPROACH;  Surgeon: Donnice Ernie, MD;  Location: [facility];  Service: Orthopedics;  Laterality: Left;   Social History   Socioeconomic History   Marital status: Widowed    Spouse name: Not on file   Number of children: 3   Years of education: Not on file   Highest education level: Not on file  Occupational History   Occupation: retired travel agent     Comment: full time  Tobacco Use   Smoking status: Former    Current packs/day: 0.00    Types: Cigarettes    Quit date: 09/13/2002    Years since quitting: 21.6   Smokeless tobacco: Never  Vaping Use   Vaping status: Never Used  Substance and Sexual Activity   Alcohol  use: Not Currently    Alcohol /week: 21.0 standard drinks of alcohol     Types: 14 Glasses of wine, 7 Standard drinks or equivalent per week   Drug use: No   Sexual activity: Not Currently    Birth control/protection: Abstinence, Post-menopausal  Other Topics Concern   Not on file  Social History Narrative   This patient is widowed. She lives alone  in a single level  home in [place], Personal Care Services from [facility] [facility] to assist with meals and transportation.    Patient is retired firefighter.    Main exercise is walking, she has a development worker, international aid.   Stopped smoking 20 years ago, drinks 1-2 glasses of wine a  day   Patient has 2 sons and one daughter, all live out of town.   Has a Living Will, DO NOT RESUSCITATE.   Social Drivers of Health   Tobacco Use: Medium Risk (04/18/2024)   Patient History    Smoking Tobacco Use: Former  Smokeless Tobacco Use: Never    Passive Exposure: Not on file  Financial Resource Strain: Not on file  Food Insecurity: Not on file  Transportation Needs: Not on file  Physical Activity: Not on file  Stress: Not on file  Social Connections: Not on file  Depression (EYV7-0): Not on file  Alcohol  Screen: Not on file  Housing: Not on file  Utilities: Not on file  Health Literacy: Not on file   Family History  Problem Relation Age of Onset   Cancer Mother        OVARIAN   Diabetes Father    Hypertension Father    Allergies[1] Prior to Admission medications  Medication Sig Start Date End Date Taking? Authorizing Provider  acetaminophen  (TYLENOL  8 HOUR) 650 MG CR tablet Take 1,300 mg by mouth every 8 (eight) hours as needed for pain.     [provider]  amLODipine  (NORVASC ) 5 MG tablet Take 1 tablet by mouth daily. 11/26/19   [provider]  betamethasone  valerate ointment (VALISONE ) 0.1 % Apply 1 Application topically 2 (two) times daily. Place in a thin layer to skin twice daily for 2 weeks and then use twice a week at bedtime. 01/05/23   Cathlyn JAYSON Nikki Bobie FORBES, MD  buprenorphine  (BUTRANS ) 20 MCG/HR PTWK 1 patch once a week.    [provider]  celecoxib  (CELEBREX ) 100 MG capsule Take 100 mg by mouth. 02/28/24 05/28/24  [provider]  cetirizine  (ZYRTEC ) 10 MG tablet Take by mouth. 09/17/14   [provider]  Cholecalciferol  (VITAMIN D3) 50 MCG (2000 UT) TABS Take 2,000 Units by mouth daily.    [provider]  diclofenac sodium (VOLTAREN) 1 % GEL Apply 1 application topically 3 (three) times daily as needed (for back pain).    [provider]  docusate sodium  (COLACE) 50 MG capsule Take 50 mg by mouth daily.    [provider]  escitalopram  (LEXAPRO ) 5 MG tablet TAKE 1 TABLET BY MOUTH DAILY*CALL OFFICE TO MAKE APPOINTMENT* Patient taking differently: Take 10 mg by mouth daily. 05/29/15   Reed, Tiffany L, DO  fluconazole (DIFLUCAN) 200 MG tablet Take 1 tablet by mouth daily. Patient not taking: Reported on 04/16/2024 07/28/22   [provider]  fluticasone  (FLONASE ) 50 MCG/ACT nasal spray Place 1 spray into both nostrils daily as needed for allergies or rhinitis.    [provider]  gabapentin  (NEURONTIN ) 100 MG capsule Take by mouth.    [provider]  galantamine  (RAZADYNE  ER) 24 MG 24 hr capsule TAKE 1 CAPSULE BY MOUTH EVERY DAY Patient taking differently: Take 24 mg by mouth daily with breakfast. 01/05/15   Reed, Tiffany L, DO  ipratropium (ATROVENT ) 0.03 % nasal spray 2 sprays. 02/07/24   [provider]  linaclotide (LINZESS) 72 MCG capsule Take 72 mcg by mouth every other day.     [provider]  MACROBID 100 MG capsule 1 capsule  with food Orally twice a day x 7 for 7 days 01/03/23   [provider]  Multiple Vitamin (MULTIVITAMIN WITH MINERALS) TABS tablet Take 1 tablet by mouth daily.    [provider]  MYRBETRIQ 25 MG TB24 tablet 1 tablet Orally Once a day; Duration: 30 days    [provider]  NAMENDA  XR 28 MG CP24 24 hr capsule TAKE 1 CAPSULE(28 MG) BY MOUTH DAILY Patient taking differently: Take 28 mg by mouth daily. 01/28/15   Reed, Tiffany L, DO  nystatin  (MYCOSTATIN /NYSTOP )  powder Apply 1 Application topically 3 (three) times daily. Apply to affected area for up to 7 days 07/27/22   Amundson C Silva, Brook E, MD  nystatin  cream (MYCOSTATIN ) Apply 1 application topically 2 (two) times daily as needed (for yeast infection).    [provider]  nystatin -triamcinolone  ointment (MYCOLOG) Apply 1 application topically 2 (two) times daily. Apply BID for up to 7 days. 11/25/20   Amundson C Silva, Bobie BRAVO, MD  olmesartan (BENICAR) 20 MG tablet Take 40 mg by mouth daily.     [provider]  polyethylene glycol (MIRALAX  / GLYCOLAX ) 17 g packet Take 17 g by mouth daily.    [provider]  valACYclovir (VALTREX) 1000 MG tablet Take 1,000 mg by mouth 3 (three) times daily. 11/18/22   [provider]   CT PELVIS WO CONTRAST Result Date: 04/18/2024 CLINICAL DATA:  Status post trauma. EXAM: CT PELVIS WITHOUT CONTRAST TECHNIQUE: Multidetector CT imaging of the pelvis was performed following the standard protocol without intravenous contrast. RADIATION DOSE REDUCTION: This exam was performed according to the departmental dose-optimization program which includes automated exposure control, adjustment of the mA and/or kV according to patient size and/or use of iterative reconstruction technique. COMPARISON:  November 16, 2020 FINDINGS: Urinary Tract:  No abnormality visualized. Bowel: There is no evidence of bowel dilatation. Noninflamed diverticula are seen throughout the sigmoid  colon. A large stool burden is noted. Vascular/Lymphatic: No pathologically enlarged lymph nodes. Mild atherosclerotic disease is seen. Reproductive:  No mass or other significant abnormality Other:  None. Musculoskeletal: There is a total left hip replacement. Associated streak artifact is seen with subsequently limited evaluation of the adjacent osseous and soft tissue structures. A chronic fracture deformity is seen involving the junction of the head and neck of the proximal right femur. An additional small nondisplaced acute fracture is present within this region (best seen on coronal reformatted images 67 through 71, CT series 9). There is no evidence of dislocation. Marked severity degenerative changes are present throughout the right hip in the form of joint space narrowing, acetabular sclerosis and lateral acetabular bony spurring. Marked severity degenerative changes are present within the visualized portion of the lower lumbar spine. IMPRESSION: 1. Chronic fracture deformity involving the junction of the head and neck of the proximal right femur with an additional small nondisplaced acute fracture involving the right femoral neck. 2. Total left hip replacement. 3. Marked severity degenerative changes within the right hip and visualized portion of the lower lumbar spine. 4. Sigmoid diverticulosis. 5. Aortic atherosclerosis. Electronically Signed   By: Suzen Dials M.D.   On: 04/18/2024 12:20   CT Lumbar Spine Wo Contrast Result Date: 04/18/2024 EXAM: CT OF THE LUMBAR SPINE WITHOUT CONTRAST 04/18/2024 11:54:56 AM TECHNIQUE: CT of the lumbar spine was performed without the administration of intravenous contrast. Multiplanar reformatted images are provided for review. Automated exposure control, iterative reconstruction, and/or weight based adjustment of the mA/kV was utilized to reduce the radiation dose to as low as reasonably achievable. COMPARISON: Lumbar MRI 05/21/2019. CT abdomen and pelvis  11/16/2020. CLINICAL HISTORY: 89 year old female. Back trauma FINDINGS: Normal lumbar segmentation. BONES AND ALIGNMENT: Chronic severe thoracolumbar scoliosis with levoconvex lumbar curvature. Intact visible sacrum and SI joints. Maintained vertebral body height. No acute osseous abnormality. SOFT TISSUES: No acute lumbar paraspinal soft tissue finding. Visible non-contrast lower chest and abdominal viscera stable compared to 2022. Small chronic fat containing Bochdalek hernia at the left costophrenic angle (normal variant). DEGENERATIVE CHANGES: Chronic severe lumbar  disc, endplate, and facet degeneration throughout. Despite the chronic severe lumbar spine degeneration and advanced underlying scoliosis, by CT the most pronounced level of spinal stenosis is in the lower thoracic spine at T11-T12 (mild to moderate on series 9 image 23). IMPRESSION: 1. No acute traumatic injury identified in the lumbar spine. 2. Chronic severe lower thoracic and lumbar degeneration superimposed on advanced scoliosis. 3. By CT the most pronounced level of spinal stenosis appears to be at T11-T12. Electronically signed by: Helayne Hurst MD 04/18/2024 12:07 PM EST RP Workstation: [id number]    Positive ROS: All other systems have been reviewed and were otherwise negative with the exception of those mentioned in the HPI and as above.  Physical Exam: General: Alert, no acute distress Cardiovascular: No pedal edema Respiratory: No cyanosis, no use of accessory musculature GI: No organomegaly, abdomen is soft and non-tender Skin: No lesions in the area of chief complaint Neurologic: Sensation intact distally Psychiatric: Patient is competent for consent with normal mood and affect Lymphatic: No axillary or cervical lymphadenopathy  MUSCULOSKELETAL: Right lower extremity - skin intact - TTP anterior hip - extremity externally rotated - no pain with log roll - able to flex and extend the ankle - sensation intact to light  touch, sural, saphenous, tibial, deep and superficial peroneal nerve distributions -2 + DP pulse  Imaging:  X-rays: 3 views of  right hip were obtained and reviewed.  My independent interpretation is as follows: non displaced femoral neck fracture without intertrochanteric extension, moderate osteoarthritis present  CT pelvis obtained and reviewed.  My independent interpretation is as follows: evidence of a non displaced right femoral neck fracture with moderate osteoarthritis of the right hip.  Well seated left total hip arthroplasty with evidence of migration, lucency or periprosthetic fracture  Assessment: 89 year old female with dementia and a non displaced right femoral neck fracture s/p fall.  The patient is indicated to undergo right hip cannulated screw fixation due to the nondisplaced fracture of the hip and to permit early weight bearing and mobilization of the patient.  Risks, benefits and alternative were discussed with the patient's daughter Darice Canton over the phone.  Risk discussed include, but are not limited to bleeding, infection, damage to surrounding structures such as nerves, arteries and veins, blood clots, malunion, nonunion, hardware failure or migration, the need for additional procedures, persistent pain due to arthritis and risks of anesthesia.  The patient's daughter expressed understanding and elected to proceed with cannulated screw fixation of the right hip.  The patient will be planned for OR tomorrow due to a lack of OR availability today for cases that are non emergent.  Informed consent obtained, NPO at midnight. Pre operative clearance per internal medicine.  Pre-op labs have been ordered including EXG and CXR.  Plan: NWB RLE Proceed with right femoral neck cannulated screws tomorrow. Informed consent obtained from daughter NPO at midnight Pre-op labs Ancef  pre op 2 g for abx ppx 1 g TXA pre op Plan for asa 81 mg BID x 6 weeks post op for DVT ppx Post op PT  for ambulation Dispo: pending OR     Rankin LELON Pizza, MD    04/18/2024 1:39 PM      [1]  Allergies Allergen Reactions   Amoxicillin -Pot Clavulanate     Other Reaction(s): severe diarrhea in 2022   Sulfa Antibiotics Other (See Comments)    Reaction unknown   Denosumab  Rash   Lidocaine  Rash   "

## 2024-04-18 NOTE — ED Provider Notes (Signed)
 " [place] [facility] DE[facility]MENT AT [facility] Provider Note   CSN: [id number] Arrival date & time: 04/18/24  1016     Patient presents with: Margaret [name] Hassel is a 89 y.o. female with a of Alzheimer's dementia presenting to the ED in the company of her caretaker with a fall.  Caretaker reports that patient slipped on the floor and had a fall onto her right side.  She did not strike her head.  Her caretaker was able to help her to the bathroom and then in wheelchair afterwards.  Typically the patient can stand with assistance.  The patient is not on blood thinners.  She is a level 5 caveat due to dementia.  Her caretaker reports the patient has chronic sciatica on her right side and chronically complains of right hip pain but seem to be more uncomfortable with the right hip after a   HPI     Prior to Admission medications  Medication Sig Start Date End Date Taking? Authorizing Provider  acetaminophen  (TYLENOL  8 HOUR) 650 MG CR tablet Take 1,300 mg by mouth every 8 (eight) hours as needed for pain.     [provider]  amLODipine  (NORVASC ) 5 MG tablet Take 1 tablet by mouth daily. 11/26/19   [provider]  betamethasone  valerate ointment (VALISONE ) 0.1 % Apply 1 Application topically 2 (two) times daily. Place in a thin layer to skin twice daily for 2 weeks and then use twice a week at bedtime. 01/05/23   Cathlyn JAYSON Nikki Bobie FORBES, MD  buprenorphine  (BUTRANS ) 20 MCG/HR PTWK 1 patch once a week.    [provider]  celecoxib  (CELEBREX ) 100 MG capsule Take 100 mg by mouth. 02/28/24 05/28/24  [provider]  cetirizine  (ZYRTEC ) 10 MG tablet Take by mouth. 09/17/14   [provider]  Cholecalciferol  (VITAMIN D3) 50 MCG (2000 UT) TABS Take 2,000 Units by mouth daily.    [provider]  diclofenac sodium (VOLTAREN) 1 % GEL Apply 1 application topically 3 (three) times daily as needed (for back pain).    [provider]  docusate sodium  (COLACE) 50 MG capsule Take 50 mg by mouth daily.    [provider]  escitalopram  (LEXAPRO ) 5 MG tablet TAKE 1 TABLET BY MOUTH DAILY*CALL OFFICE TO MAKE APPOINTMENT* Patient taking differently: Take 10 mg by mouth daily. 05/29/15   Reed, Tiffany L, DO  fluconazole (DIFLUCAN) 200 MG tablet Take 1 tablet by mouth daily. Patient not taking: Reported on 04/16/2024 07/28/22   [provider]  fluticasone  (FLONASE ) 50 MCG/ACT nasal spray Place 1 spray into both nostrils daily as needed for allergies or rhinitis.    [provider]  gabapentin  (NEURONTIN ) 100 MG capsule Take by mouth.    [provider]  galantamine  (RAZADYNE  ER) 24 MG 24 hr capsule TAKE 1 CAPSULE BY MOUTH EVERY DAY Patient taking differently: Take 24 mg by mouth daily with breakfast. 01/05/15   Reed, Tiffany L, DO  ipratropium (ATROVENT ) 0.03 % nasal spray 2 sprays. 02/07/24   [provider]  linaclotide (LINZESS) 72 MCG capsule Take 72 mcg by mouth every other day.     [provider]  MACROBID 100 MG capsule 1 capsule with food Orally twice a day x 7 for 7 days 01/03/23   [provider]  Multiple Vitamin (MULTIVITAMIN WITH MINERALS) TABS tablet Take 1 tablet by mouth daily.    [provider]  MYRBETRIQ 25 MG [phone]  tablet 1 tablet Orally Once a day; Duration: 30 days    [provider]  NAMENDA  XR 28 MG CP24 24 hr capsule TAKE 1 CAPSULE(28 MG) BY MOUTH DAILY Patient taking differently: Take 28 mg by mouth daily. 01/28/15   Reed, Tiffany L, DO  nystatin  (MYCOSTATIN /NYSTOP ) powder Apply 1 Application topically 3 (three) times daily. Apply to affected area for up to 7 days 07/27/22   Amundson C Silva, Brook E, MD  nystatin  cream (MYCOSTATIN ) Apply 1 application topically 2 (two) times daily as needed (for yeast infection).    [provider]  nystatin -triamcinolone  ointment (MYCOLOG) Apply 1 application topically 2 (two) times daily.  Apply BID for up to 7 days. 11/25/20   Amundson C Silva, Bobie BRAVO, MD  olmesartan (BENICAR) 20 MG tablet Take 40 mg by mouth daily.     [provider]  polyethylene glycol (MIRALAX  / GLYCOLAX ) 17 g packet Take 17 g by mouth daily.    [provider]  valACYclovir (VALTREX) 1000 MG tablet Take 1,000 mg by mouth 3 (three) times daily. 11/18/22   [provider]    Allergies: Amoxicillin -pot clavulanate, Sulfa antibiotics, Denosumab , and Lidocaine     Review of Systems  Updated Vital Signs BP (!) 156/62   Pulse 63   Temp 97.7 F (36.5 C) (Oral)   Resp 12   Ht 4' 11 (1.499 m)   Wt 53.5 kg   SpO2 91%   BMI 23.82 kg/m   Physical Exam Constitutional:      General: She is not in acute distress. HENT:     Head: Normocephalic and atraumatic.  Eyes:     Conjunctiva/sclera: Conjunctivae normal.     Pupils: Pupils are equal, round, and reactive to light.  Cardiovascular:     Rate and Rhythm: Normal rate and regular rhythm.  Pulmonary:     Effort: Pulmonary effort is normal. No respiratory distress.  Abdominal:     General: There is no distension.     Tenderness: There is no abdominal tenderness.  Musculoskeletal:     Comments: Patient displaced and discomfort with range of motion testing at the right hip.  No tenderness isolated of the ankles, knees, left hip, or upper extremities.  No pain or tenderness of the ribs with palpation.  Skin:    General: Skin is warm and dry.  Neurological:     General: No focal deficit present.     Mental Status: She is alert. Mental status is at baseline.     (all labs ordered are listed, but only abnormal results are displayed) Labs Reviewed  CBC - Abnormal; Notable for the following components:      Result Value   WBC 24.7 (*)    MCV 100.2 (*)    All other components within normal limits  BASIC METABOLIC PANEL WITH GFR - Abnormal; Notable for the following components:   Glucose, Bld 110 (*)    All other components  within normal limits  APTT  PROTIME-INR  URINALYSIS, ROUTINE W REFLEX MICROSCOPIC    EKG: EKG Interpretation Date/Time:  Thursday April 18 2024 14:04:12 EST Ventricular Rate:  67 PR Interval:  154 QRS Duration:  49 QT Interval:  504 QTC Calculation: 533 R Axis:   31  Text Interpretation: Sinus rhythm Nonspecific T abnormalities, diffuse leads Prolonged QT interval Confirmed by Cottie Cough [phone]) on 04/18/2024 2:07:07 PM  Radiology: ARCOLA HIP UNILAT WITH PELVIS 2-3 VIEWS RIGHT Result Date: 04/18/2024 EXAM: 2 OR MORE VIEW(S) XRAY OF  THE BILATERAL HIP 04/18/2024 02:01:00 PM COMPARISON: None available. CLINICAL HISTORY: Pain FINDINGS: BONES AND JOINTS: Partially visualized left hip arthroplasty in place. Foreshortening and irregularity of the right femoral neck concerning for an acute impacted fracture - Limited evaluation due to overlapping osseous structures and overlying soft tissues. Moderate degenerative changes of the right hip with joint space narrowing and marginal spurring. LUMBAR SPINE: Degenerative disc disease of the visualized lower lumbar spine. SOFT TISSUES: The soft tissues are unremarkable. IMPRESSION: 1. Foreshortening and irregularity of the right femoral neck concerning for an acute impacted fracture. Limited evaluation due to overlapping osseous structures and overlying soft tissues. Recommend CT. Electronically signed by: Morgane Naveau MD 04/18/2024 02:13 PM EST RP Workstation: [id number]   DG Chest 1 View Result Date: 04/18/2024 EXAM: 1 VIEW(S) XRAY OF THE CHEST 04/18/2024 02:01:00 PM COMPARISON: None available. CLINICAL HISTORY: Pre-op exam FINDINGS: LUNGS AND PLEURA: No focal pulmonary opacity. No pleural effusion. No pneumothorax. HEART AND MEDIASTINUM: No acute abnormality of the cardiac and mediastinal silhouettes. BONES AND SOFT TISSUES: Multilevel degenerative disc disease of thoracic spine. Dextrocurvature of thoracolumbar junction. Degenerative changes of  the bilateral shoulders. IMPRESSION: 1. No acute findings. Electronically signed by: Morgane Naveau MD 04/18/2024 02:11 PM EST RP Workstation: [id number]   CT PELVIS WO CONTRAST Result Date: 04/18/2024 CLINICAL DATA:  Status post trauma. EXAM: CT PELVIS WITHOUT CONTRAST TECHNIQUE: Multidetector CT imaging of the pelvis was performed following the standard protocol without intravenous contrast. RADIATION DOSE REDUCTION: This exam was performed according to the departmental dose-optimization program which includes automated exposure control, adjustment of the mA and/or kV according to patient size and/or use of iterative reconstruction technique. COMPARISON:  November 16, 2020 FINDINGS: Urinary Tract:  No abnormality visualized. Bowel: There is no evidence of bowel dilatation. Noninflamed diverticula are seen throughout the sigmoid colon. A large stool burden is noted. Vascular/Lymphatic: No pathologically enlarged lymph nodes. Mild atherosclerotic disease is seen. Reproductive:  No mass or other significant abnormality Other:  None. Musculoskeletal: There is a total left hip replacement. Associated streak artifact is seen with subsequently limited evaluation of the adjacent osseous and soft tissue structures. A chronic fracture deformity is seen involving the junction of the head and neck of the proximal right femur. An additional small nondisplaced acute fracture is present within this region (best seen on coronal reformatted images 67 through 71, CT series 9). There is no evidence of dislocation. Marked severity degenerative changes are present throughout the right hip in the form of joint space narrowing, acetabular sclerosis and lateral acetabular bony spurring. Marked severity degenerative changes are present within the visualized portion of the lower lumbar spine. IMPRESSION: 1. Chronic fracture deformity involving the junction of the head and neck of the proximal right femur with an additional small  nondisplaced acute fracture involving the right femoral neck. 2. Total left hip replacement. 3. Marked severity degenerative changes within the right hip and visualized portion of the lower lumbar spine. 4. Sigmoid diverticulosis. 5. Aortic atherosclerosis. Electronically Signed   By: Suzen Dials M.D.   On: 04/18/2024 12:20   CT Lumbar Spine Wo Contrast Result Date: 04/18/2024 EXAM: CT OF THE LUMBAR SPINE WITHOUT CONTRAST 04/18/2024 11:54:56 AM TECHNIQUE: CT of the lumbar spine was performed without the administration of intravenous contrast. Multiplanar reformatted images are provided for review. Automated exposure control, iterative reconstruction, and/or weight based adjustment of the mA/kV was utilized to reduce the radiation dose to as low as reasonably achievable. COMPARISON: Lumbar MRI 05/21/2019. CT abdomen and pelvis  11/16/2020. CLINICAL HISTORY: 89 year old female. Back trauma FINDINGS: Normal lumbar segmentation. BONES AND ALIGNMENT: Chronic severe thoracolumbar scoliosis with levoconvex lumbar curvature. Intact visible sacrum and SI joints. Maintained vertebral body height. No acute osseous abnormality. SOFT TISSUES: No acute lumbar paraspinal soft tissue finding. Visible non-contrast lower chest and abdominal viscera stable compared to 2022. Small chronic fat containing Bochdalek hernia at the left costophrenic angle (normal variant). DEGENERATIVE CHANGES: Chronic severe lumbar disc, endplate, and facet degeneration throughout. Despite the chronic severe lumbar spine degeneration and advanced underlying scoliosis, by CT the most pronounced level of spinal stenosis is in the lower thoracic spine at T11-T12 (mild to moderate on series 9 image 23). IMPRESSION: 1. No acute traumatic injury identified in the lumbar spine. 2. Chronic severe lower thoracic and lumbar degeneration superimposed on advanced scoliosis. 3. By CT the most pronounced level of spinal stenosis appears to be at T11-T12.  Electronically signed by: Helayne Hurst MD 04/18/2024 12:07 PM EST RP Workstation: [id number]     Procedures   Medications Ordered in the ED  oxyCODONE  (Oxy IR/ROXICODONE ) immediate release tablet 2.5 mg (has no administration in time range)  oxyCODONE  (Oxy IR/ROXICODONE ) immediate release tablet 5 mg (has no administration in time range)  acetaminophen  (TYLENOL ) tablet 1,000 mg (1,000 mg Oral Given 04/18/24 1413)  acetaminophen  (TYLENOL ) tablet 650 mg (650 mg Oral Given 04/18/24 1058)    Clinical Course as of 04/18/24 1445  Thu Apr 18, 2024  1335 Dr Teresa orthopedics at bedside talking to family [MT]  1341 Dr Teresa reporting plan for OR today for femur fix.  He discussed with patient's daughter and PoA by phone and got permission.  He requests admission labs and hospitalist admit/consult for operative clearance. [MT]    Clinical Course User Index [MT] Elaisha Zahniser, Donnice PARAS, MD                                 Medical Decision Making Amount and/or Complexity of Data Reviewed Labs: ordered. Radiology: ordered.  Risk OTC drugs. Decision regarding hospitalization.   Patient is here with a mechanical fall at home witnessed by her caretaker, provide supplemental history.  She seems to have isolated discomfort around her right hip.  Some of this may be chronic from the history provided, but given her advanced age, I think CT imaging is reasonable.  She likely has significant osteoarthritis of the hip and is known to have osteoporosis as well.  These are risk factors for occult fracture.  She is neurovascularly intact otherwise.  No evidence of head injury or trauma.  No indication for additional x-ray imaging at this time.  Tylenol  was given for pain.  CT imaging reviewed.  This is concerning for a femoral neck fracture.  The case was discussed with on-call orthopedics - Dr Rankin Teresa Gaba  Patient's labs are reviewed.  She has a nonspecific leukocytosis, without clear evidence of  infection, although she is minimally verbal due to dementia.  CT imaging showing large stool burden but no obvious inflammatory findings.  She is passing stool here in the ED and had a bowel movement.  Will add on a UA.  Do not see clear evidence of sepsis, she remains afebrile.  Plan to admit to the hospitalist.  Orthopedic surgeon reports plan for likely OR tomorrow.  N.p.o. at midnight.     Final diagnoses:  Fall, initial encounter  Closed fracture of neck of right femur, initial encounter (  [facility])    ED Discharge Orders     None          Cottie Donnice PARAS, MD 04/18/24 1446  "

## 2024-04-19 ENCOUNTER — Encounter (HOSPITAL_COMMUNITY): Payer: Self-pay | Admitting: Certified Registered Nurse Anesthetist

## 2024-04-19 ENCOUNTER — Inpatient Hospital Stay (HOSPITAL_COMMUNITY)

## 2024-04-19 ENCOUNTER — Encounter (HOSPITAL_COMMUNITY)
Admission: EM | Disposition: A | Discharge status: Home Health | Payer: Self-pay | Source: Home / Self Care | Attending: Student

## 2024-04-19 ENCOUNTER — Encounter (HOSPITAL_COMMUNITY): Payer: Self-pay | Admitting: Internal Medicine

## 2024-04-19 ENCOUNTER — Ambulatory Visit: Payer: Self-pay | Admitting: Obstetrics and Gynecology

## 2024-04-19 ENCOUNTER — Inpatient Hospital Stay (HOSPITAL_COMMUNITY): Payer: Self-pay | Admitting: Certified Registered Nurse Anesthetist

## 2024-04-19 DIAGNOSIS — F03918 Unspecified dementia, unspecified severity, with other behavioral disturbance: Secondary | ICD-10-CM

## 2024-04-19 DIAGNOSIS — I10 Essential (primary) hypertension: Secondary | ICD-10-CM

## 2024-04-19 DIAGNOSIS — E559 Vitamin D deficiency, unspecified: Secondary | ICD-10-CM

## 2024-04-19 DIAGNOSIS — M5416 Radiculopathy, lumbar region: Secondary | ICD-10-CM

## 2024-04-19 DIAGNOSIS — S72001A Fracture of unspecified part of neck of right femur, initial encounter for closed fracture: Secondary | ICD-10-CM

## 2024-04-19 DIAGNOSIS — E7800 Pure hypercholesterolemia, unspecified: Secondary | ICD-10-CM

## 2024-04-19 DIAGNOSIS — F3340 Major depressive disorder, recurrent, in remission, unspecified: Secondary | ICD-10-CM | POA: Diagnosis not present

## 2024-04-19 DIAGNOSIS — Z87891 Personal history of nicotine dependence: Secondary | ICD-10-CM | POA: Diagnosis not present

## 2024-04-19 LAB — COMPREHENSIVE METABOLIC PANEL WITH GFR
ALT: 11 U/L (ref 0–44)
AST: 20 U/L (ref 15–41)
Albumin: 3.7 g/dL (ref 3.5–5.0)
Alkaline Phosphatase: 59 U/L (ref 38–126)
Anion gap: 9 (ref 5–15)
BUN: 16 mg/dL (ref 8–23)
CO2: 27 mmol/L (ref 22–32)
Calcium: 9.3 mg/dL (ref 8.9–10.3)
Chloride: 105 mmol/L (ref 98–111)
Creatinine, Ser: 0.68 mg/dL (ref 0.44–1.00)
GFR, Estimated: >60 mL/min
Glucose, Bld: 100 mg/dL — ABNORMAL HIGH (ref 70–99)
Potassium: 3.9 mmol/L (ref 3.5–5.1)
Sodium: 141 mmol/L (ref 135–145)
Total Bilirubin: 0.5 mg/dL (ref 0.0–1.2)
Total Protein: 5.9 g/dL — ABNORMAL LOW (ref 6.5–8.1)

## 2024-04-19 LAB — CBC
HCT: 36.8 % (ref 36.0–46.0)
Hemoglobin: 12.1 g/dL (ref 12.0–15.0)
MCH: 32.6 pg (ref 26.0–34.0)
MCHC: 32.9 g/dL (ref 30.0–36.0)
MCV: 99.2 fL (ref 80.0–100.0)
Platelets: 280 K/uL (ref 150–400)
RBC: 3.71 MIL/uL — ABNORMAL LOW (ref 3.87–5.11)
RDW: 12.9 % (ref 11.5–15.5)
WBC: 13.1 K/uL — ABNORMAL HIGH (ref 4.0–10.5)
nRBC: 0 % (ref 0.0–0.2)

## 2024-04-19 LAB — VITAMIN D 25 HYDROXY (VIT D DEFICIENCY, FRACTURES): Vit D, 25-Hydroxy: 82.9 ng/mL (ref 30–100)

## 2024-04-19 MED ORDER — CARMEX CLASSIC LIP BALM EX OINT
TOPICAL_OINTMENT | CUTANEOUS | Status: DC | PRN
  Filled 2024-04-19: qty 10

## 2024-04-19 MED ORDER — BUPRENORPHINE 20 MCG/HR TD PTWK
1.0000 | MEDICATED_PATCH | TRANSDERMAL | Status: DC
  Filled 2024-04-19 (×2): qty 1

## 2024-04-19 MED ORDER — SUGAMMADEX SODIUM 200 MG/2ML IV SOLN
INTRAVENOUS | Status: DC | PRN
  Administered 2024-04-19: 200 mg via INTRAVENOUS

## 2024-04-19 MED ORDER — NAPHAZOLINE-GLYCERIN 0.012-0.25 % OP SOLN: 1.0000 [drp] | Freq: Four times a day (QID) | OPHTHALMIC | Status: DC | PRN

## 2024-04-19 MED ORDER — ASPIRIN 81 MG PO TBEC: 81.0000 mg | DELAYED_RELEASE_TABLET | Freq: Two times a day (BID) | ORAL | 0 refills | Status: AC

## 2024-04-19 MED ORDER — HYDROMORPHONE HCL 1 MG/ML IJ SOLN
0.2500 mg | INTRAMUSCULAR | Status: DC | PRN
  Administered 2024-04-19 (×2): 0.25 mg via INTRAVENOUS

## 2024-04-19 MED ORDER — ROCURONIUM BROMIDE 10 MG/ML (PF) SYRINGE
PREFILLED_SYRINGE | INTRAVENOUS | Status: DC | PRN
  Administered 2024-04-19: 50 mg via INTRAVENOUS

## 2024-04-19 MED ORDER — BETAMETHASONE VALERATE 0.1 % EX OINT
1.0000 | TOPICAL_OINTMENT | Freq: Two times a day (BID) | CUTANEOUS | Status: DC
  Administered 2024-04-20 – 2024-04-21 (×4): 1 via TOPICAL
  Filled 2024-04-19: qty 15

## 2024-04-19 MED ORDER — OXYCODONE HCL 5 MG PO TABS: 5.0000 mg | ORAL_TABLET | Freq: Once | ORAL | Status: DC | PRN

## 2024-04-19 MED ORDER — ONDANSETRON HCL 4 MG/2ML IJ SOLN
INTRAMUSCULAR | Status: DC | PRN
  Administered 2024-04-19: 4 mg via INTRAVENOUS

## 2024-04-19 MED ORDER — TRANEXAMIC ACID-NACL 1000-0.7 MG/100ML-% IV SOLN
1000.0000 mg | Freq: Once | INTRAVENOUS | Status: AC
  Administered 2024-04-19: 1000 mg via INTRAVENOUS
  Filled 2024-04-19: qty 100

## 2024-04-19 MED ORDER — HYDROMORPHONE HCL 1 MG/ML IJ SOLN
INTRAMUSCULAR | Status: AC
  Filled 2024-04-19: qty 1

## 2024-04-19 MED ORDER — PROPOFOL 10 MG/ML IV BOLUS
INTRAVENOUS | Status: DC | PRN
  Administered 2024-04-19: 100 mg via INTRAVENOUS

## 2024-04-19 MED ORDER — CYCLOSPORINE 0.05 % OP EMUL
1.0000 [drp] | Freq: Two times a day (BID) | OPHTHALMIC | Status: DC
  Administered 2024-04-20 – 2024-04-21 (×4): 1 [drp] via OPHTHALMIC
  Filled 2024-04-19 (×5): qty 30

## 2024-04-19 MED ORDER — SUGAMMADEX SODIUM 200 MG/2ML IV SOLN
INTRAVENOUS | Status: AC
  Filled 2024-04-19: qty 2

## 2024-04-19 MED ORDER — LIDOCAINE HCL (PF) 2 % IJ SOLN
INTRAMUSCULAR | Status: AC
  Filled 2024-04-19: qty 5

## 2024-04-19 MED ORDER — CEFAZOLIN SODIUM-DEXTROSE 2-4 GM/100ML-% IV SOLN
2.0000 g | Freq: Four times a day (QID) | INTRAVENOUS | Status: AC
  Administered 2024-04-19 (×2): 2 g via INTRAVENOUS
  Filled 2024-04-19 (×2): qty 100

## 2024-04-19 MED ORDER — PHENYLEPHRINE HCL-NACL 20-0.9 MG/250ML-% IV SOLN
INTRAVENOUS | Status: DC | PRN
  Administered 2024-04-19: 50 ug/min via INTRAVENOUS

## 2024-04-19 MED ORDER — METOCLOPRAMIDE HCL 5 MG PO TABS: 5.0000 mg | ORAL_TABLET | Freq: Three times a day (TID) | ORAL | Status: DC | PRN

## 2024-04-19 MED ORDER — DOCUSATE SODIUM 100 MG PO CAPS
100.0000 mg | ORAL_CAPSULE | Freq: Two times a day (BID) | ORAL | Status: DC
  Administered 2024-04-19 – 2024-04-21 (×6): 100 mg via ORAL
  Filled 2024-04-19 (×6): qty 1

## 2024-04-19 MED ORDER — ONDANSETRON HCL 4 MG/2ML IJ SOLN
INTRAMUSCULAR | Status: AC
  Filled 2024-04-19: qty 2

## 2024-04-19 MED ORDER — ROCURONIUM BROMIDE 10 MG/ML (PF) SYRINGE
PREFILLED_SYRINGE | INTRAVENOUS | Status: AC
  Filled 2024-04-19: qty 10

## 2024-04-19 MED ORDER — AMISULPRIDE (ANTIEMETIC) 5 MG/2ML IV SOLN: 10.0000 mg | Freq: Once | INTRAVENOUS | Status: DC | PRN

## 2024-04-19 MED ORDER — ADULT MULTIVITAMIN W/MINERALS CH
1.0000 | ORAL_TABLET | Freq: Every day | ORAL | Status: DC
  Administered 2024-04-20 – 2024-04-21 (×2): 1 via ORAL
  Filled 2024-04-19 (×2): qty 1

## 2024-04-19 MED ORDER — OXYCODONE HCL 5 MG/5ML PO SOLN: 5.0000 mg | Freq: Once | ORAL | Status: DC | PRN

## 2024-04-19 MED ORDER — METOCLOPRAMIDE HCL 5 MG/ML IJ SOLN: 5.0000 mg | Freq: Three times a day (TID) | INTRAMUSCULAR | Status: DC | PRN

## 2024-04-19 MED ORDER — OXYCODONE HCL 5 MG PO TABS: 5.0000 mg | ORAL_TABLET | Freq: Once | ORAL | 0 refills | Status: DC | PRN

## 2024-04-19 MED ORDER — DEXAMETHASONE SOD PHOSPHATE PF 10 MG/ML IJ SOLN
INTRAMUSCULAR | Status: DC | PRN
  Administered 2024-04-19: 10 mg via INTRAVENOUS

## 2024-04-19 MED ORDER — ENOXAPARIN SODIUM 40 MG/0.4ML IJ SOSY
40.0000 mg | PREFILLED_SYRINGE | INTRAMUSCULAR | Status: DC
  Administered 2024-04-20: 40 mg via SUBCUTANEOUS
  Filled 2024-04-19: qty 0.4

## 2024-04-19 MED ORDER — PROPOFOL 500 MG/50ML IV EMUL
INTRAVENOUS | Status: AC
  Filled 2024-04-19: qty 50

## 2024-04-19 MED ORDER — FENTANYL CITRATE (PF) 100 MCG/2ML IJ SOLN
INTRAMUSCULAR | Status: DC | PRN
  Administered 2024-04-19: 25 ug via INTRAVENOUS

## 2024-04-19 MED ORDER — PHENYLEPHRINE HCL-NACL 20-0.9 MG/250ML-% IV SOLN
INTRAVENOUS | Status: AC
  Filled 2024-04-19: qty 500

## 2024-04-19 MED ORDER — LIDOCAINE HCL (PF) 2 % IJ SOLN
INTRAMUSCULAR | Status: DC | PRN
  Administered 2024-04-19: 20 mg via INTRADERMAL

## 2024-04-19 MED ORDER — FENTANYL CITRATE (PF) 100 MCG/2ML IJ SOLN
INTRAMUSCULAR | Status: AC
  Filled 2024-04-19: qty 2

## 2024-04-19 MED ORDER — LACTATED RINGERS IV SOLN: INTRAVENOUS | Status: DC

## 2024-04-19 MED ORDER — ENSURE PLUS HIGH PROTEIN PO LIQD
237.0000 mL | Freq: Two times a day (BID) | ORAL | Status: DC
  Administered 2024-04-19 – 2024-04-20 (×2): 237 mL via ORAL

## 2024-04-19 NOTE — Anesthesia Preprocedure Evaluation (Signed)
"                                    Anesthesia Evaluation  Patient identified by MRN, date of birth, ID band Patient awake    Reviewed: Allergy & Precautions, H&P , NPO status , Patient's Chart, lab work & pertinent test results  Airway Mallampati: II  TM Distance: >3 FB Neck ROM: Full    Dental  (+) Dental Advisory Given   Pulmonary neg pulmonary ROS, former smoker   Pulmonary exam normal breath sounds clear to auscultation       Cardiovascular hypertension, Pt. on medications negative cardio ROS Normal cardiovascular exam Rhythm:Regular Rate:Normal     Neuro/Psych    Depression   Dementia negative neurological ROS  negative psych ROS   GI/Hepatic Neg liver ROS,GERD  ,,  Endo/Other  negative endocrine ROS    Renal/GU negative Renal ROS  negative genitourinary   Musculoskeletal  (+) Arthritis , Osteoarthritis,    Abdominal   Peds negative pediatric ROS (+)  Hematology negative hematology ROS (+)   Anesthesia Other Findings   Reproductive/Obstetrics negative OB ROS                              Anesthesia Physical Anesthesia Plan  ASA: 3  Anesthesia Plan: General   Post-op Pain Management: Tylenol  PO (pre-op)*, Celebrex  PO (pre-op)* and Gabapentin  PO (pre-op)*   Induction: Intravenous  PONV Risk Score and Plan: 3 and Ondansetron , Dexamethasone , Midazolam and Treatment may vary due to age or medical condition  Airway Management Planned: Oral ETT  Additional Equipment:   Intra-op Plan:   Post-operative Plan: Extubation in OR  Informed Consent: I have reviewed the patients History and Physical, chart, labs and discussed the procedure including the risks, benefits and alternatives for the proposed anesthesia with the patient or authorized representative who has indicated his/her understanding and acceptance.     Dental advisory given  Plan Discussed with: CRNA  Anesthesia Plan Comments:          Anesthesia Quick Evaluation  "

## 2024-04-19 NOTE — Transfer of Care (Signed)
 Immediate Anesthesia Transfer of Care Note  Patient: Annalynne E Quinley  Procedure(s) Performed: DECANNULATED SCREW FIXATION RIGHT HIP (Right: Hip)  Patient Location: PACU  Anesthesia Type:General  Level of Consciousness: sedated  Airway & Oxygen Therapy: Patient Spontanous Breathing and Patient connected to face mask oxygen  Post-op Assessment: Report given to RN and Post -op Vital signs reviewed and stable  Post vital signs: Reviewed and stable  Last Vitals:  Vitals Value Taken Time  BP 159/74 04/19/24 08:47  Temp    Pulse 78 04/19/24 08:48  Resp 18 04/19/24 08:48  SpO2 100 % 04/19/24 08:48  Vitals shown include unfiled device data.  Last Pain:  Vitals:   04/19/24 0651  TempSrc: Oral         Complications: No notable events documented.

## 2024-04-19 NOTE — Plan of Care (Signed)
  Problem: Clinical Measurements: Goal: Respiratory complications will improve Outcome: Progressing   Problem: Clinical Measurements: Goal: Cardiovascular complication will be avoided Outcome: Progressing   Problem: Activity: Goal: Risk for activity intolerance will decrease Outcome: Progressing   Problem: Nutrition: Goal: Adequate nutrition will be maintained Outcome: Progressing   Problem: Elimination: Goal: Will not experience complications related to bowel motility Outcome: Progressing   Problem: Elimination: Goal: Will not experience complications related to urinary retention Outcome: Progressing

## 2024-04-19 NOTE — Progress Notes (Signed)
 " PROGRESS NOTE  Margaret [name] FMW:[id number] DOB: 04/12/1935   PCP: Loreli Elsie JONETTA Mickey., MD  Patient is from: Home.  Uses rolling walker and cane for mobility.  Has 24/7 care.  DOA: 04/18/2024 LOS: 1  Chief complaints Chief Complaint  Patient presents with   Fall     Brief Narrative / Interim history: 89 year old F with PMH of severe Alzheimer's dementia, osteoporosis, osteoarthritis, left THA, chronic pain, HTN and GERD brought to ED after she sustained a fall at home.  Reportedly slipped and fell on the floor and was unable to bear weight on right leg.  CT showed nondisplaced femoral neck fracture.  Orthopedic surgery consulted.  Patient underwent cannulated screw fixation of right hip fracture by Dr. Teresa on 04/19/2024.  Subjective: Seen and examined earlier this morning after she returned from surgery.  Caregiver at bedside.  Patient has no complaints but not a great historian.  She is oriented to self.  Caregiver concerned about pressure skin injury.  She says her backside is red but no broken skin.  She likes to avoid pressure ulcer during this hospitalization.  Per caregiver, daughter is her POA.   Assessment and plan: Mechanical fall at home-reportedly slipped and fell on the floor.  Denies striking head or LOC.  No prodromes. Acute closed right hip fracture-noted on CT -S/p cannulated screw fixation of right hip fracture by Dr. Teresa on 12/26 - Pain control and VTE prophylaxis per surgery - PT/OT.  Patient ambulates using walker and cane at baseline. - Check vitamin D .  Alzheimer's dementia without behavioral disturbance: Awake but only oriented to self. -Reorientation and delirium precaution -Can sinew home meds including memantine  and galantamine  -Minimize sedating medications.  Osteoarthritis/lumbar radiculopathy-CT lumbar spine without acute finding.  On Butrans  patch, Celebrex , Tylenol  and gabapentin  at home -Now on Tylenol , oxycodone  and Celebrex . -Continue  home gabapentin .  Essential hypertension: BP within acceptable range. -Continue amlodipine  and Avapro  -Pain control as above  Depression: Stable - Continue Lexapro   Leukocytosis: Likely demargination.  Improved without antibiotics. - Continue monitoring   Increase nutrient needs Body mass index is 22.82 kg/m. Nutrition Problem: Increased nutrient needs Etiology: hip fracture, post-op healing Signs/Symptoms: estimated needs Interventions: Ensure Enlive (each supplement provides 350kcal and 20 grams of protein), MVI   Stage I pressure injury: Caregiver reports redness on her bottoms.  Denies skin break.  Present on admission. -Consult WOCN  DVT prophylaxis:  enoxaparin  (LOVENOX ) injection 40 mg Start: 04/20/24 0800 SCDs Start: 04/19/24 1023  Code Status: DNR Family Communication: Updated caregiver at bedside Level of care: Med-Surg Status is: Inpatient Remains inpatient appropriate because: Right hip fracture   Final disposition: Home   55 minutes with more than 50% spent in reviewing records, counseling patient/family and coordinating care.  Consultants:  Orthopedic surgery  Procedures: 12/26-cannulated screw fixation of right hip fracture by Dr. Teresa  Microbiology summarized: None  Objective: Vitals:   04/19/24 0915 04/19/24 0930 04/19/24 0945 04/19/24 0953  BP: (!) 155/110 (!) 148/82 (!) 148/79 139/66  Pulse: 84 79 79 78  Resp: 11 11 10 16   Temp:   98.5 F (36.9 C) 98.9 F (37.2 C)  TempSrc:    Oral  SpO2: 95% 95% 95% 99%  Weight:      Height:        Examination:  GENERAL: No apparent distress.  Nontoxic. HEENT: MMM.  Vision and hearing grossly intact.  NECK: Supple.  No apparent JVD.  RESP:  No IWOB.  Fair aeration bilaterally.  CVS:  RRR. Heart sounds normal.  ABD/GI/GU: BS+. Abd soft, NTND.  MSK/EXT:  Moves extremities. No apparent deformity. No edema.  SKIN: no apparent skin lesion or wound NEURO: AA.  Oriented to self.  No apparent focal  neuro deficit. PSYCH: Calm. Normal affect.   Sch Meds:  Scheduled Meds:  acetaminophen   1,000 mg Oral Q8H   amLODipine   5 mg Oral Daily   celecoxib   100 mg Oral BID   cholecalciferol   2,000 Units Oral Daily   docusate sodium   100 mg Oral BID   [START ON 04/20/2024] enoxaparin  (LOVENOX ) injection  40 mg Subcutaneous Q24H   escitalopram   10 mg Oral Daily   feeding supplement  237 mL Oral BID BM   fluticasone   1 spray Each Nare Daily   gabapentin   300 mg Oral Q0600   And   gabapentin   200 mg Oral Daily   And   gabapentin   200 mg Oral QHS   galantamine   16 mg Oral Q breakfast   ipratropium  2 spray Each Nare BID   irbesartan   150 mg Oral Daily   memantine   28 mg Oral Daily   [START ON 04/20/2024] multivitamin with minerals  1 tablet Oral Daily   mupirocin  ointment  1 Application Nasal BID   polyethylene glycol  17 g Oral Daily   Continuous Infusions:   ceFAZolin  (ANCEF ) IV     tranexamic acid      PRN Meds:.lip balm, metoCLOPramide  **OR** metoCLOPramide  (REGLAN ) injection, morphine  injection, ondansetron  **OR** ondansetron  (ZOFRAN ) IV, oxyCODONE , oxyCODONE   Antimicrobials: Anti-infectives (From admission, onward)    Start     Dose/Rate Route Frequency Ordered Stop   04/19/24 1400  ceFAZolin  (ANCEF ) IVPB 2g/100 mL premix        2 g 200 mL/hr over 30 Minutes Intravenous Every 6 hours 04/19/24 1022 04/20/24 0159   04/19/24 0600  ceFAZolin  (ANCEF ) IVPB 2g/100 mL premix        2 g 200 mL/hr over 30 Minutes Intravenous On call to O.R. 04/18/24 1803 04/19/24 0732        I have personally reviewed the following labs and images: CBC: Recent Labs  Lab 04/18/24 1412 04/19/24 0334  WBC 24.7* 13.1*  HGB 13.6 12.1  HCT 41.6 36.8  MCV 100.2* 99.2  PLT 307 280   BMP &GFR Recent Labs  Lab 04/18/24 1412 04/19/24 0334  NA 139 141  K 3.6 3.9  CL 103 105  CO2 27 27  GLUCOSE 110* 100*  BUN 10 16  CREATININE 0.44 0.68  CALCIUM  9.3 9.3   Estimated Creatinine Clearance:  33.8 mL/min (by C-G formula based on SCr of 0.68 mg/dL). Liver & Pancreas: Recent Labs  Lab 04/19/24 0334  AST 20  ALT 11  ALKPHOS 59  BILITOT 0.5  PROT 5.9*  ALBUMIN 3.7   No results for input(s): LIPASE, AMYLASE in the last 168 hours. No results for input(s): AMMONIA in the last 168 hours. Diabetic: No results for input(s): HGBA1C in the last 72 hours. No results for input(s): GLUCAP in the last 168 hours. Cardiac Enzymes: No results for input(s): CKTOTAL, CKMB, CKMBINDEX, TROPONINI in the last 168 hours. No results for input(s): PROBNP in the last 8760 hours. Coagulation Profile: Recent Labs  Lab 04/18/24 1412  INR 1.0   Thyroid  Function Tests: No results for input(s): TSH, T4TOTAL, FREET4, T3FREE, THYROIDAB in the last 72 hours. Lipid Profile: No results for input(s): CHOL, HDL, LDLCALC, TRIG, CHOLHDL, LDLDIRECT in the last 72 hours.  Anemia Panel: No results for input(s): VITAMINB12, FOLATE, FERRITIN, TIBC, IRON, RETICCTPCT in the last 72 hours. Urine analysis:    Component Value Date/Time   COLORURINE STRAW (A) 04/18/2024 1548   APPEARANCEUR HAZY (A) 04/18/2024 1548   APPEARANCEUR Cloudy (A) 02/05/2015 1116   LABSPEC 1.005 04/18/2024 1548   PHURINE 8.0 04/18/2024 1548   GLUCOSEU NEGATIVE 04/18/2024 1548   HGBUR NEGATIVE 04/18/2024 1548   BILIRUBINUR NEGATIVE 04/18/2024 1548   BILIRUBINUR Negative 02/05/2015 1116   KETONESUR NEGATIVE 04/18/2024 1548   PROTEINUR NEGATIVE 04/18/2024 1548   NITRITE NEGATIVE 04/18/2024 1548   LEUKOCYTESUR NEGATIVE 04/18/2024 1548   Sepsis Labs: Invalid input(s): PROCALCITONIN, LACTICIDVEN  Microbiology: Recent Results (from the past 240 hours)  WET PREP FOR TRICH, YEAST, CLUE     Status: None   Collection Time: 04/16/24 12:29 PM   Specimen: Genital  Result Value Ref Range Status   Source: VAGINA  Final   RESULT   Final    Comment: EPITHELIAL CELLS-PRESENT CLUE  CELLS-NONE SEEN YEAST-NONE SEEN TRICHOMONAS-NONE SEEN WBC-FEW BACTERIA-FEW EPITH. CELLS <6 PER HPF     Radiology Studies: DG C-Arm 1-60 Min-No Report Result Date: 04/19/2024 Fluoroscopy was utilized by the requesting physician.  No radiographic interpretation.   DG HIP UNILAT WITH PELVIS 2-3 VIEWS RIGHT Result Date: 04/18/2024 EXAM: 2 OR MORE VIEW(S) XRAY OF THE BILATERAL HIP 04/18/2024 02:01:00 PM COMPARISON: None available. CLINICAL HISTORY: Pain FINDINGS: BONES AND JOINTS: Partially visualized left hip arthroplasty in place. Foreshortening and irregularity of the right femoral neck concerning for an acute impacted fracture - Limited evaluation due to overlapping osseous structures and overlying soft tissues. Moderate degenerative changes of the right hip with joint space narrowing and marginal spurring. LUMBAR SPINE: Degenerative disc disease of the visualized lower lumbar spine. SOFT TISSUES: The soft tissues are unremarkable. IMPRESSION: 1. Foreshortening and irregularity of the right femoral neck concerning for an acute impacted fracture. Limited evaluation due to overlapping osseous structures and overlying soft tissues. Recommend CT. Electronically signed by: Morgane Naveau MD 04/18/2024 02:13 PM EST RP Workstation: [id number]   DG Chest 1 View Result Date: 04/18/2024 EXAM: 1 VIEW(S) XRAY OF THE CHEST 04/18/2024 02:01:00 PM COMPARISON: None available. CLINICAL HISTORY: Pre-op exam FINDINGS: LUNGS AND PLEURA: No focal pulmonary opacity. No pleural effusion. No pneumothorax. HEART AND MEDIASTINUM: No acute abnormality of the cardiac and mediastinal silhouettes. BONES AND SOFT TISSUES: Multilevel degenerative disc disease of thoracic spine. Dextrocurvature of thoracolumbar junction. Degenerative changes of the bilateral shoulders. IMPRESSION: 1. No acute findings. Electronically signed by: Morgane Naveau MD 04/18/2024 02:11 PM EST RP Workstation: [id number]   CT PELVIS WO CONTRAST Result  Date: 04/18/2024 CLINICAL DATA:  Status post trauma. EXAM: CT PELVIS WITHOUT CONTRAST TECHNIQUE: Multidetector CT imaging of the pelvis was performed following the standard protocol without intravenous contrast. RADIATION DOSE REDUCTION: This exam was performed according to the departmental dose-optimization program which includes automated exposure control, adjustment of the mA and/or kV according to patient size and/or use of iterative reconstruction technique. COMPARISON:  November 16, 2020 FINDINGS: Urinary Tract:  No abnormality visualized. Bowel: There is no evidence of bowel dilatation. Noninflamed diverticula are seen throughout the sigmoid colon. A large stool burden is noted. Vascular/Lymphatic: No pathologically enlarged lymph nodes. Mild atherosclerotic disease is seen. Reproductive:  No mass or other significant abnormality Other:  None. Musculoskeletal: There is a total left hip replacement. Associated streak artifact is seen with subsequently limited evaluation of the adjacent osseous and soft tissue structures. A chronic fracture deformity  is seen involving the junction of the head and neck of the proximal right femur. An additional small nondisplaced acute fracture is present within this region (best seen on coronal reformatted images 67 through 71, CT series 9). There is no evidence of dislocation. Marked severity degenerative changes are present throughout the right hip in the form of joint space narrowing, acetabular sclerosis and lateral acetabular bony spurring. Marked severity degenerative changes are present within the visualized portion of the lower lumbar spine. IMPRESSION: 1. Chronic fracture deformity involving the junction of the head and neck of the proximal right femur with an additional small nondisplaced acute fracture involving the right femoral neck. 2. Total left hip replacement. 3. Marked severity degenerative changes within the right hip and visualized portion of the lower lumbar  spine. 4. Sigmoid diverticulosis. 5. Aortic atherosclerosis. Electronically Signed   By: Suzen Dials M.D.   On: 04/18/2024 12:20   CT Lumbar Spine Wo Contrast Result Date: 04/18/2024 EXAM: CT OF THE LUMBAR SPINE WITHOUT CONTRAST 04/18/2024 11:54:56 AM TECHNIQUE: CT of the lumbar spine was performed without the administration of intravenous contrast. Multiplanar reformatted images are provided for review. Automated exposure control, iterative reconstruction, and/or weight based adjustment of the mA/kV was utilized to reduce the radiation dose to as low as reasonably achievable. COMPARISON: Lumbar MRI 05/21/2019. CT abdomen and pelvis 11/16/2020. CLINICAL HISTORY: 89 year old female. Back trauma FINDINGS: Normal lumbar segmentation. BONES AND ALIGNMENT: Chronic severe thoracolumbar scoliosis with levoconvex lumbar curvature. Intact visible sacrum and SI joints. Maintained vertebral body height. No acute osseous abnormality. SOFT TISSUES: No acute lumbar paraspinal soft tissue finding. Visible non-contrast lower chest and abdominal viscera stable compared to 2022. Small chronic fat containing Bochdalek hernia at the left costophrenic angle (normal variant). DEGENERATIVE CHANGES: Chronic severe lumbar disc, endplate, and facet degeneration throughout. Despite the chronic severe lumbar spine degeneration and advanced underlying scoliosis, by CT the most pronounced level of spinal stenosis is in the lower thoracic spine at T11-T12 (mild to moderate on series 9 image 23). IMPRESSION: 1. No acute traumatic injury identified in the lumbar spine. 2. Chronic severe lower thoracic and lumbar degeneration superimposed on advanced scoliosis. 3. By CT the most pronounced level of spinal stenosis appears to be at T11-T12. Electronically signed by: Helayne Hurst MD 04/18/2024 12:07 PM EST RP Workstation: [id number]      Ujbz T. Jaslen Adcox [facility]  If 7PM-7AM, please contact  night-coverage [email or web address] 04/19/2024, 11:42 AM   "

## 2024-04-19 NOTE — Anesthesia Postprocedure Evaluation (Signed)
"   Anesthesia Post Note  Patient: Margaret [name]  Procedure(s) Performed: DECANNULATED SCREW FIXATION RIGHT HIP (Right: Hip)     Patient location during evaluation: PACU Anesthesia Type: General Level of consciousness: awake and alert Pain management: pain level controlled Vital Signs Assessment: post-procedure vital signs reviewed and stable Respiratory status: spontaneous breathing, nonlabored ventilation and respiratory function stable Cardiovascular status: blood pressure returned to baseline and stable Postop Assessment: no apparent nausea or vomiting Anesthetic complications: no   No notable events documented.  Last Vitals:  Vitals:   04/19/24 0945 04/19/24 0953  BP: (!) 148/79 139/66  Pulse: 79 78  Resp: 10 16  Temp: 36.9 C 37.2 C  SpO2: 95% 99%    Last Pain:  Vitals:   04/19/24 0953  TempSrc: Oral  PainSc:                  Margaret [name]      "

## 2024-04-19 NOTE — Progress Notes (Signed)
 Requested Betadine  7.5% scrub from pharmacy but they stated that the only bottle they had was sent to 4th floor last night but I can not find the bottle nowhere.Charge nurse Lauren notified. Patient getting prep at this time with CHG.

## 2024-04-19 NOTE — Anesthesia Procedure Notes (Addendum)
 Procedure Name: Intubation Date/Time: 04/19/2024 7:31 AM  Performed by: Carleton Garnette SAUNDERS, CRNAPre-anesthesia Checklist: Patient identified, Emergency Drugs available, Suction available, Patient being monitored and Timeout performed Patient Re-evaluated:Patient Re-evaluated prior to induction Oxygen Delivery Method: Circle system utilized Preoxygenation: Pre-oxygenation with 100% oxygen Induction Type: IV induction Ventilation: Mask ventilation without difficulty Laryngoscope Size: Mac and 3 Grade View: Grade II Tube type: Oral Tube size: 7.0 mm Number of attempts: 1 Airway Equipment and Method: Stylet Placement Confirmation: ETT inserted through vocal cords under direct vision, positive ETCO2 and breath sounds checked- equal and bilateral Secured at: 20 cm Tube secured with: Tape Dental Injury: Teeth and Oropharynx as per pre-operative assessment

## 2024-04-19 NOTE — Op Note (Addendum)
 DATE OF SURGERY: 04/19/2024  PREOPERATIVE DIAGNOSES:  1. .Right nondisplaced femoral neck fracture  POSTOPERATIVE DIAGNOSES:  1. The same  PROCEDURES:  1. Right hip cannulated screw fixation  SURGEON: Surgeons and Role:    * Teresa Rankin ORN, MD - Primary  ANESTHESIA: General  BLOOD LOSS: Minimal.   COMPLICATIONS: None.   PATHOLOGY: None.   FINDINGS: Non displaced femoral neck fracture s/p cannulated screw fixation  ASSISTANTS: None  Implants: Smith and Nephew 6.5 cannulated screws x3 85 mm, 85 mm, 80 mm lengths, with washer x 2   INDICATIONS: The patient is a 89 y.o. female who presented with right non displaced femoral neck fracture indicated for surgery to allow for stabilization of the fracture and early mobilization with physical therapy and weight bearing of the right lower extremity. The patient was seen in the preop holding room, the operative site, consent form and indications were reviewed with the patient.  The extremity was examined and found to have pain in the right hip along with intact motor and intact sensation to light touch to sural, saphenous, tibial, deep and superficial peroneal nerve distributions as well as 2+ DP pulse. Opportunity was provided the patient for final questions and all questions were answered to their satisfaction. The operative extremity was marked by me.    DESCRIPTION OF PROCEDURE: The patient was brought back to the operating room and transferred to the OR table where they underwent general anesthesia per anesthesia protocol. The patient was placed in the supine position on the Hana table. Prophylactic antibiotics were administered prior to incision.   The extremity was prepped and draped in standard sterile fashion.  A time out was performed identifying the patient, procedure, extremity with laterality, antibiotics given, and all present agreed.   Three guidwires were then inserted percutaneously laterally through the lateral cortex of the  proximal femur and into the femoral neck  in an inverted triangle formation.  The position of these wires were checked on biplanar fluoroscopy. Satisfied with their position, a 15 blade was used to make three small incisions in the skin and IT band.  The screw lengths were then measured and drilled for.  The inferior calcar, superior anterior, superior posterior screws that were placed measured 85, 80, and 85 mm respectively with associated washers for the inferior and anterosuperior screw.  All screws had excellent purchase. The guidewires were removed and the final biplanar flouroscopy demonstrated excellent position of the screws.  The wounds were irrigated and closed subcutaneously with 3-0 vicryl suture and dermabond followed by a mepilex dressing.  The drapes were removed and the patient was awakened per anesthesia protocol.    The patient was transferred to recovery room in stable condition after all counts were correct. There were no complications.  POSTOPERATIVE PLAN: Weight bearing as tolerated right lower extremity. Aspirin  81 mg BID x 6 weeks outpatient for DVT ppx.  Plan for post op PT evaluation with likely discharge home due to full time nursing help present.

## 2024-04-19 NOTE — Progress Notes (Signed)
 Initial Nutrition Assessment  INTERVENTION:   -Ensure Plus High Protein po BID, each supplement provides 350 kcal and 20 grams of protein.   -Multivitamin with minerals daily  NUTRITION DIAGNOSIS:   Increased nutrient needs related to hip fracture, post-op healing as evidenced by estimated needs.  GOAL:   Patient will meet greater than or equal to 90% of their needs  MONITOR:   PO intake, Supplement acceptance  REASON FOR ASSESSMENT:   Consult Hip fracture protocol  ASSESSMENT:   89 y.o. female with medical history significant of osteoarthritis, disturbance of smell and taste, hypertension, GERD, lichen sclerosis, facial lipoma, mixed hyperlipidemia, osteopenia, scoliosis, tubular adenoma of the colon herpes zoster, chronic back pain, Alzheimer's dementia with behavioral disturbance who had a mechanical fall at home.  Patient  currently in OR for cannulated screw fixation of right femoral neck fracture. Prior to being NPO for surgery today, pt consumed 75% of dinner last night. Per chart review, pt with dementia and has a full time caregiver at home.  Given increased healing needs, will order Ensure supplements to start tonight and pt can resume daily MVI tomorrow.  Per weight records, pt weighed 103 lbs on 9/23. Current weight: 113 lbs. 117 lbs at admission. Continue to monitor trends.   Medications: Vitamin D , Miralax , Lactated ringers   Labs reviewed.  NUTRITION - FOCUSED PHYSICAL EXAM:  Unable to complete, working remotely.  Diet Order:   Diet Order             Diet NPO time specified  Diet effective midnight                   EDUCATION NEEDS:   Not appropriate for education at this time  Skin:  Skin Assessment: Skin Integrity Issues: Skin Integrity Issues:: Incisions Incisions: rt hip  Last BM:  12/25  Height:   Ht Readings from Last 1 Encounters:  04/19/24 4' 11 (1.499 m)    Weight:   Wt Readings from Last 1 Encounters:  04/19/24 51.3  kg    BMI:  Body mass index is 22.82 kg/m.  Estimated Nutritional Needs:   Kcal:  1300-1500  Protein:  60-70g  Fluid:  1.5L/day  Morna Lee, MS, RD, LDN Inpatient Clinical Dietitian Contact via Secure chat

## 2024-04-19 NOTE — Progress Notes (Signed)
" ° °  Subjective:  89 y.o. female with non displaced right femoral neck fracture Patient reports pain that is well controlled. Otherwise, no acute events overnight. No numbness or tingling to the extremity.  Objective:   VITALS:   Vitals:   04/18/24 2137 04/19/24 0227 04/19/24 0508 04/19/24 0651  BP: (!) 147/58 121/65 (!) 137/50 (!) 132/58  Pulse: 69 (!) 57 (!) 57 (!) 55  Resp: 16 12 12 16   Temp: 98 F (36.7 C) 98.7 F (37.1 C) 97.8 F (36.6 C) 98.1 F (36.7 C)  TempSrc: Oral Oral Oral Oral  SpO2: 93% 98% 95%   Weight:    51.3 kg  Height:    4' 11 (1.499 m)    Physical Exam: General: Alert, no acute distress Cardiovascular: No pedal edema Respiratory: No cyanosis, no use of accessory musculature GI: No organomegaly, abdomen is soft and non-tender Skin: No lesions in the area of chief complaint Neurologic: Sensation intact distally Psychiatric: Patient is competent for consent with normal mood and affect Lymphatic: No axillary or cervical lymphadenopathy  MUSCULOSKELETAL: Right lower extremity - skin intact - TTP anterior hip - extremity externally rotated - no pain with log roll - able to flex and extend the ankle - sensation intact to light touch, sural, saphenous, tibial, deep and superficial peroneal nerve distributions -2 + DP pulse    Lab Results  Component Value Date   WBC 13.1 (H) 04/19/2024   HGB 12.1 04/19/2024   HCT 36.8 04/19/2024   MCV 99.2 04/19/2024   PLT 280 04/19/2024   BMET    Component Value Date/Time   NA 141 04/19/2024 0334   NA 141 02/03/2015 1004   K 3.9 04/19/2024 0334   CL 105 04/19/2024 0334   CO2 27 04/19/2024 0334   GLUCOSE 100 (H) 04/19/2024 0334   BUN 16 04/19/2024 0334   BUN 16 02/03/2015 1004   CREATININE 0.68 04/19/2024 0334   CREATININE 0.62 04/07/2014 1408   CALCIUM  9.3 04/19/2024 0334   GFRNONAA >60 04/19/2024 0334   GFRNONAA 87 04/07/2014 1408     Assessment/Plan: 89 y.o. female  with  Principal Problem:    Closed right hip fracture (HCC) Active Problems:   Hyperlipidemia   Essential hypertension   Dementia with behavioral disturbance (HCC)   Lumbar radiculopathy   Vitamin D  deficiency   Recurrent major depression in remission Patient with right non displaced femoral neck fracture.  Risks and benefits again reviewed with the caregiver.  Plan for OR today  NWB RLE Proceed with right femoral neck cannulated screws  Informed consent obtained from daughter NPO  Pre-op lclearance per primary Ancef  pre op 2 g for abx ppx 1 g TXA pre op Plan for asa 81 mg BID x 6 weeks post op for DVT ppx Post op PT for ambulation Dispo: pending OR     Rankin LELON Pizza 04/19/2024, 7:14 AM  "

## 2024-04-19 NOTE — Progress Notes (Signed)
 Caregiver is aware that she will need to bring the following home meds: BUTRANS  patch and VALISONE  cream.

## 2024-04-19 NOTE — Consult Note (Signed)
 WOC Nurse Consult Note:  WOC consult performed remotely utilizing imaging and chart review Reason for Consult:Stage 1 to perineum area  Wound type: stage 1 pressure injury to coccyx Pressure Injury POA: Yes Measurement: see nursing flow sheets Wound bed: 100 % red intact skin Drainage (amount, consistency, odor) none Periwound: intact Dressing procedure/placement/frequency:  Coccyx: cleanse with soap and water , dry thoroughly. Apply silicone foam dressing and lift each shift to inspect site.  Change dressing Q 3 days or PRN soiling.       WOC team will not follow patient at this time, please re consult if new needs arise or wound deteriorates.   Thank you,  Doyal Polite, MSN, RN, [facility] WOC Team [phone] (Available Mon-Fri 0700-1500)

## 2024-04-20 DIAGNOSIS — F03918 Unspecified dementia, unspecified severity, with other behavioral disturbance: Secondary | ICD-10-CM | POA: Diagnosis not present

## 2024-04-20 DIAGNOSIS — M5416 Radiculopathy, lumbar region: Secondary | ICD-10-CM | POA: Diagnosis not present

## 2024-04-20 DIAGNOSIS — E7800 Pure hypercholesterolemia, unspecified: Secondary | ICD-10-CM | POA: Diagnosis not present

## 2024-04-20 DIAGNOSIS — F3340 Major depressive disorder, recurrent, in remission, unspecified: Secondary | ICD-10-CM | POA: Diagnosis not present

## 2024-04-20 DIAGNOSIS — S72001A Fracture of unspecified part of neck of right femur, initial encounter for closed fracture: Secondary | ICD-10-CM | POA: Diagnosis not present

## 2024-04-20 DIAGNOSIS — E559 Vitamin D deficiency, unspecified: Secondary | ICD-10-CM | POA: Diagnosis not present

## 2024-04-20 DIAGNOSIS — I10 Essential (primary) hypertension: Secondary | ICD-10-CM | POA: Diagnosis not present

## 2024-04-20 LAB — RENAL FUNCTION PANEL
Albumin: 3.5 g/dL (ref 3.5–5.0)
Anion gap: 11 (ref 5–15)
BUN: 25 mg/dL — ABNORMAL HIGH (ref 8–23)
CO2: 26 mmol/L (ref 22–32)
Calcium: 9.5 mg/dL (ref 8.9–10.3)
Chloride: 101 mmol/L (ref 98–111)
Creatinine, Ser: 0.89 mg/dL (ref 0.44–1.00)
GFR, Estimated: >60 mL/min
Glucose, Bld: 133 mg/dL — ABNORMAL HIGH (ref 70–99)
Phosphorus: 3.7 mg/dL (ref 2.5–4.6)
Potassium: 4.1 mmol/L (ref 3.5–5.1)
Sodium: 138 mmol/L (ref 135–145)

## 2024-04-20 LAB — CBC
HCT: 33.1 % — ABNORMAL LOW (ref 36.0–46.0)
Hemoglobin: 11 g/dL — ABNORMAL LOW (ref 12.0–15.0)
MCH: 33 pg (ref 26.0–34.0)
MCHC: 33.2 g/dL (ref 30.0–36.0)
MCV: 99.4 fL (ref 80.0–100.0)
Platelets: 247 K/uL (ref 150–400)
RBC: 3.33 MIL/uL — ABNORMAL LOW (ref 3.87–5.11)
RDW: 12.9 % (ref 11.5–15.5)
WBC: 16.4 K/uL — ABNORMAL HIGH (ref 4.0–10.5)
nRBC: 0 % (ref 0.0–0.2)

## 2024-04-20 LAB — MAGNESIUM: Magnesium: 2.3 mg/dL (ref 1.7–2.4)

## 2024-04-20 NOTE — Progress Notes (Signed)
 Subjective:  Patient reports pain as appropriately controlled. Denies any new numbness/tingling.   Objective:   VITALS:  Temp:  [97.8 F (36.6 C)-98.2 F (36.8 C)] 97.9 F (36.6 C) (12/27 9378) Pulse Rate:  [63-70] 63 (12/27 0621) Resp:  [16-18] 16 (12/27 0621) BP: (97-117)/(52-61) 99/60 (12/27 0621) SpO2:  [97 %-100 %] 100 % (12/27 0621) FiO2 (%):  [21 %] 21 % (12/26 1023)  Neurologically intact Neurovascular intact Sensation & motor grossly intact distally Intact pulses distally Compartment soft   LABS Recent Labs    04/18/24 1412 04/19/24 0334 04/20/24 0323  HGB 13.6 12.1 11.0*  WBC 24.7* 13.1* 16.4*  PLT 307 280 247   Recent Labs    04/19/24 0334 04/20/24 0323  NA 141 138  K 3.9 4.1  CL 105 101  CO2 27 26  BUN 16 25*  CREATININE 0.68 0.89  GLUCOSE 100* 133*   Recent Labs    04/18/24 1412  INR 1.0     Assessment/Plan: 1 Day Post-Op Procedures (LRB): DECANNULATED SCREW FIXATION RIGHT HIP (Right)  WBAT RLE ASA 81 mg BID x 6 wk postop Advance diet Up with therapy Discharge pending PT/Case Management  Margaret [name] 04/20/2024, 10:16 AM

## 2024-04-20 NOTE — Evaluation (Signed)
 Occupational Therapy Evaluation Patient Details Name: Margaret [name] MRN: [id number] DOB: [birth year] Today's Date: 04/20/2024   History of Present Illness   Pt is a 89 y/o female presenting on 12/25 after fall.  Found with nondisplaced femoral neck fx, s/p cannulated screw fixation of R hip 12/26. PMH includes: severe Alzheimer's dementia, osteoporosis, osteoarthritis, L THA, chronic pain, HTN.     Clinical Impressions Pt admitted for above and presents with problem list below.  Caregiver present and reports home setup/PLOF, pt needing assist for all ADLs but able to self feed and groom at times; using RW for mobility with hands on assist for safety.  Pt currently requires max +2 for bed mobility, mod +2 for transfers and max to total assist for all ADLs.  Pt following simple 1 step commands with increased time, but inconsistent; slow to respond and very limited verbalizations- caregiver reports pt fatigued from not sleeping well last night.  Pt has great support at home, and will plan to dc home with 24/7 support and HHOT services.  Will follow acutely.      If plan is discharge home, recommend the following:   Two people to help with walking and/or transfers;Two people to help with bathing/dressing/bathroom;Assistance with cooking/housework;Assist for transportation;Help with stairs or ramp for entrance;Supervision due to cognitive status     Functional Status Assessment   Patient has had a recent decline in their functional status and demonstrates the ability to make significant improvements in function in a reasonable and predictable amount of time.     Equipment Recommendations   None recommended by OT (has all equipment needed)     Recommendations for Other Services         Precautions/Restrictions   Precautions Precautions: Fall Recall of Precautions/Restrictions: Impaired Restrictions Weight Bearing Restrictions Per Provider Order: Yes RLE Weight Bearing  Per Provider Order: Weight bearing as tolerated     Mobility Bed Mobility Overal bed mobility: Needs Assistance Bed Mobility: Supine to Sit, Sit to Supine     Supine to sit: Max assist, +2 for physical assistance Sit to supine: Max assist, +2 for physical assistance   General bed mobility comments: pt needing cueing to come towards L side of bed with overall max assist +2 for LB and trunk support. once upright able to assist with scooting towards EOB    Transfers Overall transfer level: Needs assistance   Transfers: Sit to/from Stand Sit to Stand: Mod assist, +2 physical assistance           General transfer comment: mod assist +2 to fully power up and steady from elevated EOB.  cueing for hand placement and support for balance.  with increased time and support to shift weight, pt able to take several steps to [facility]      Balance Overall balance assessment: Needs assistance Sitting-balance support: No upper extremity supported, Feet supported Sitting balance-Leahy Scale: Fair Sitting balance - Comments: statically min guard for safety   Standing balance support: Bilateral upper extremity supported, During functional activity, Reliant on assistive device for balance Standing balance-Leahy Scale: Poor Standing balance comment: relies on BUE and external support                           ADL either performed or assessed with clinical judgement   ADL Overall ADL's : Needs assistance/impaired     Grooming: Maximal assistance;Standing;Sitting           Upper Body Dressing :  Maximal assistance;Sitting   Lower Body Dressing: Total assistance;+2 for physical assistance   Toilet Transfer: +2 for physical assistance;Moderate assistance   Toileting- Clothing Manipulation and Hygiene: +2 for physical assistance;Total assistance       Functional mobility during ADLs: Moderate assistance;+2 for physical assistance       Vision         Perception          Praxis         Pertinent Vitals/Pain Pain Assessment Pain Assessment: Faces Faces Pain Scale: Hurts little more Pain Location: does not state- anticipate R hip Pain Descriptors / Indicators: Discomfort Pain Intervention(s): Limited activity within patient's tolerance, Monitored during session, Repositioned     Extremity/Trunk Assessment Upper Extremity Assessment Upper Extremity Assessment: Generalized weakness   Lower Extremity Assessment Lower Extremity Assessment: Defer to PT evaluation       Communication Communication Communication: Impaired Factors Affecting Communication: Difficulty expressing self;Reduced clarity of speech   Cognition Arousal: Alert Behavior During Therapy: Flat affect Cognition: History of cognitive impairments             OT - Cognition Comments: hx of alz, follows some simple commands with increased time. very limited verbalizations                 Following commands: Impaired Following commands impaired: Follows one step commands inconsistently     Cueing  General Comments   Cueing Techniques: Verbal cues;Visual cues  pts caregiver present and supportive   Exercises     Shoulder Instructions      Home Living Family/patient expects to be discharged to:: Private residence Living Arrangements: Other (Comment) (caregiver) Available Help at Discharge: Personal care attendant Type of Home: House Home Access: Level entry     Home Layout: Able to live on main level with bedroom/bathroom     Bathroom Shower/Tub: Producer, Television/film/video: Standard     Home Equipment: Agricultural Consultant (2 wheels);Rollator (4 wheels);Cane - single point;BSC/3in1;Shower seat;Grab bars - toilet;Grab bars - tub/shower;Hand held shower head;Wheelchair - manual;Hospital bed          Prior Functioning/Environment Prior Level of Function : Needs assist             Mobility Comments: pt using RW for mobility, has someone with her  at all times ADLs Comments: pt needing assist for all ADls due to cognition, but is able to self feed and brush her teeth at times; wear depends    OT Problem List: Decreased strength;Decreased activity tolerance;Impaired balance (sitting and/or standing);Decreased cognition;Decreased safety awareness;Decreased knowledge of use of DME or AE;Decreased knowledge of precautions;Pain   OT Treatment/Interventions: Self-care/ADL training;Therapeutic exercise;DME and/or AE instruction;Patient/family education;Balance training;Therapeutic activities      OT Goals(Current goals can be found in the care plan section)   Acute Rehab OT Goals Patient Stated Goal: unable OT Goal Formulation: Patient unable to participate in goal setting Time For Goal Achievement: 05/04/24 Potential to Achieve Goals: Fair   OT Frequency:  Min 2X/week    Co-evaluation PT/OT/SLP Co-Evaluation/Treatment: Yes Reason for Co-Treatment: For patient/therapist safety;To address functional/ADL transfers;Necessary to address cognition/behavior during functional activity PT goals addressed during session: Mobility/safety with mobility OT goals addressed during session: ADL's and self-care      AM-PAC OT 6 Clicks Daily Activity     Outcome Measure Help from another person eating meals?: A Lot Help from another person taking care of personal grooming?: A Lot Help from another person toileting, which includes using  toliet, bedpan, or urinal?: Total Help from another person bathing (including washing, rinsing, drying)?: A Lot Help from another person to put on and taking off regular upper body clothing?: A Lot Help from another person to put on and taking off regular lower body clothing?: Total 6 Click Score: 10   End of Session Equipment Utilized During Treatment: Gait belt;Rolling walker (2 wheels) Nurse Communication: Mobility status;Precautions  Activity Tolerance: Patient tolerated treatment well Patient left: with  call bell/phone within reach;in bed;with nursing/sitter in room;with family/visitor present  OT Visit Diagnosis: Other abnormalities of gait and mobility (R26.89);Muscle weakness (generalized) (M62.81);History of falling (Z91.81)                Time: 9156-9079 OT Time Calculation (min): 37 min Charges:  OT General Charges $OT Visit: 1 Visit OT Evaluation $OT Eval Moderate Complexity: 1 Mod  Etta NOVAK, OT Acute Rehabilitation Services Office [phone] Secure Chat Preferred    Etta GORMAN Hope 04/20/2024, 10:55 AM

## 2024-04-20 NOTE — TOC Progression Note (Addendum)
 Transition of Care [facility]) - Progression Note    Patient Details  Name: Margaret [name] MRN: [id number] Date of Birth: Jun 18, 1934  Transition of Care [facility]) CM/SW Contact  Heather DELENA Saltness, LCSW Phone Number: 04/20/2024, 4:23 PM  Clinical Narrative:    Pt from home for witnessed fall. Pt with history of Dementia with behavioral disturbance. Pt alert and oriented x2, with poor memory. Pt resides at home with 24/7 caregivers. PT recommending HH PT/OT upon discharge as well as youth RW. CSW attempted to speak with pt's daughter, Darice Canton [phone], via phone call to discuss discharge planning. No answer, voicemail left. TOC will continue to follow.   Expected Discharge Plan and Services  Home with [facility] services     Social Drivers of Health (SDOH) Interventions SDOH Screenings   Food Insecurity: No Food Insecurity (04/18/2024)  Housing: Low Risk (04/18/2024)  Transportation Needs: No Transportation Needs (04/18/2024)  Utilities: Not At Risk (04/18/2024)  Social Connections: Moderately Isolated (04/18/2024)  Tobacco Use: Medium Risk (04/19/2024)    Readmission Risk Interventions     No data to display          Signed: Heather Saltness, MSW, LCSW Clinical Social Worker Inpatient Care Management 04/20/2024 4:27 PM

## 2024-04-20 NOTE — Progress Notes (Signed)
 " PROGRESS NOTE  Margaret [name] FMW:[id number] DOB: 1934/06/23   PCP: Loreli Elsie JONETTA Mickey., MD  Patient is from: Home.  Uses rolling walker and cane for mobility.  Has 24/7 care.  DOA: 04/18/2024 LOS: 2  Chief complaints Chief Complaint  Patient presents with   Fall     Brief Narrative / Interim history: 89 year old F with PMH of severe Alzheimer's dementia, osteoporosis, osteoarthritis, left THA, chronic pain, HTN and GERD brought to ED after she sustained a fall at home.  Reportedly slipped and fell on the floor and was unable to bear weight on right leg.  CT showed nondisplaced femoral neck fracture.  Orthopedic surgery consulted.  Patient underwent cannulated screw fixation of right hip fracture by Dr. Teresa on 04/19/2024.  Subjective: Seen and examined earlier this morning.  No major events overnight of this morning.  No complaints but not a reliable historian.  She is awake but only oriented to self.  Denies pain.   Assessment and plan: Mechanical fall at home-reportedly slipped and fell on the floor.  Denies striking head or LOC.  No prodromes. Acute closed right hip fracture-noted on CT -S/p cannulated screw fixation of right hip fracture by Dr. Teresa on 12/26 -Ortho recs: -WBAT on RLE per Ortho. -Aspirin  81 mg twice daily for 6 weeks for VTE prophylaxis -Up with therapy -PT/OT.  Patient ambulates using walker and cane at baseline.  Alzheimer's dementia without behavioral disturbance: Awake but only oriented to self. -Reorientation and delirium precaution -Can sinew home meds including memantine  and galantamine  -Minimize sedating medications.  Osteoarthritis/lumbar radiculopathy-CT lumbar spine without acute finding.  On Butrans  patch, Celebrex , Tylenol  and gabapentin  at home -On Tylenol , oxycodone , Celebrex  and home Butrans  patch. -Continue home gabapentin .  Essential hypertension: BP within acceptable range. -Continue amlodipine  and Avapro  -Pain control as  above  Depression: Stable - Continue Lexapro   Leukocytosis: Likely demargination.  Improved without antibiotics. - Continue monitoring   Increase nutrient needs Body mass index is 22.82 kg/m. Nutrition Problem: Increased nutrient needs Etiology: hip fracture, post-op healing Signs/Symptoms: estimated needs Interventions: Ensure Enlive (each supplement provides 350kcal and 20 grams of protein), MVI  Stage I pressure injury: Caregiver reports redness on her bottoms.  Denies skin break.  Present on admission. Wound 04/19/24 1205 Pressure Injury Perineum Mid Stage 1 -  Intact skin with non-blanchable redness of a localized area usually over a bony prominence. (Active)     DVT prophylaxis:  enoxaparin  (LOVENOX ) injection 40 mg Start: 04/20/24 0800 SCDs Start: 04/19/24 1023  Code Status: DNR Family Communication: None at bedside. Level of care: Med-Surg Status is: Inpatient Remains inpatient appropriate because: Right hip fracture   Final disposition: Home   35 minutes with more than 50% spent in reviewing records, counseling patient/family and coordinating care.  Consultants:  Orthopedic surgery  Procedures: 12/26-cannulated screw fixation of right hip fracture by Dr. Teresa  Microbiology summarized: None  Objective: Vitals:   04/19/24 1402 04/19/24 2028 04/20/24 0017 04/20/24 0621  BP: (!) 116/52 (!) 97/58 117/61 99/60  Pulse: 70 63 66 63  Resp: 18 17 18 16   Temp: 97.8 F (36.6 C) 98.2 F (36.8 C) 98.2 F (36.8 C) 97.9 F (36.6 C)  TempSrc:  Oral Oral Oral  SpO2: 97% 100% 100% 100%  Weight:      Height:        Examination:  GENERAL: No apparent distress.  Nontoxic. HEENT: MMM.  Vision and hearing grossly intact.  NECK: Supple.  No apparent JVD.  RESP:  No IWOB.  Fair aeration bilaterally. CVS:  RRR. Heart sounds normal.  ABD/GI/GU: BS+. Abd soft, NTND.  MSK/EXT:  Moves extremities. No apparent deformity. No edema.  SKIN: no apparent skin lesion or  wound NEURO: AA.  Oriented to self.  No apparent focal neuro deficit. PSYCH: Calm. Normal affect.   Sch Meds:  Scheduled Meds:  acetaminophen   1,000 mg Oral Q8H   amLODipine   5 mg Oral Daily   betamethasone  valerate ointment  1 Application Topical BID   buprenorphine   1 patch Transdermal Weekly   celecoxib   100 mg Oral BID   cholecalciferol   2,000 Units Oral Daily   cycloSPORINE   1 drop Both Eyes BID   docusate sodium   100 mg Oral BID   enoxaparin  (LOVENOX ) injection  40 mg Subcutaneous Q24H   escitalopram   10 mg Oral Daily   feeding supplement  237 mL Oral BID BM   fluticasone   1 spray Each Nare Daily   gabapentin   300 mg Oral Q0600   And   gabapentin   200 mg Oral Daily   And   gabapentin   200 mg Oral QHS   galantamine   16 mg Oral Q breakfast   ipratropium  2 spray Each Nare BID   irbesartan   150 mg Oral Daily   memantine   28 mg Oral Daily   multivitamin with minerals  1 tablet Oral Daily   mupirocin  ointment  1 Application Nasal BID   polyethylene glycol  17 g Oral Daily   Continuous Infusions:   PRN Meds:.lip balm, metoCLOPramide  **OR** metoCLOPramide  (REGLAN ) injection, morphine  injection, naphazoline-glycerin , ondansetron  **OR** ondansetron  (ZOFRAN ) IV, oxyCODONE , oxyCODONE   Antimicrobials: Anti-infectives (From admission, onward)    Start     Dose/Rate Route Frequency Ordered Stop   04/19/24 1400  ceFAZolin  (ANCEF ) IVPB 2g/100 mL premix        2 g 200 mL/hr over 30 Minutes Intravenous Every 6 hours 04/19/24 1022 04/20/24 0026   04/19/24 0600  ceFAZolin  (ANCEF ) IVPB 2g/100 mL premix        2 g 200 mL/hr over 30 Minutes Intravenous On call to O.R. 04/18/24 1803 04/19/24 0732        I have personally reviewed the following labs and images: CBC: Recent Labs  Lab 04/18/24 1412 04/19/24 0334 04/20/24 0323  WBC 24.7* 13.1* 16.4*  HGB 13.6 12.1 11.0*  HCT 41.6 36.8 33.1*  MCV 100.2* 99.2 99.4  PLT 307 280 247   BMP &GFR Recent Labs  Lab 04/18/24 1412  04/19/24 0334 04/20/24 0323  NA 139 141 138  K 3.6 3.9 4.1  CL 103 105 101  CO2 27 27 26   GLUCOSE 110* 100* 133*  BUN 10 16 25*  CREATININE 0.44 0.68 0.89  CALCIUM  9.3 9.3 9.5  MG  --   --  2.3  PHOS  --   --  3.7   Estimated Creatinine Clearance: 30.4 mL/min (by C-G formula based on SCr of 0.89 mg/dL). Liver & Pancreas: Recent Labs  Lab 04/19/24 0334 04/20/24 0323  AST 20  --   ALT 11  --   ALKPHOS 59  --   BILITOT 0.5  --   PROT 5.9*  --   ALBUMIN 3.7 3.5   No results for input(s): LIPASE, AMYLASE in the last 168 hours. No results for input(s): AMMONIA in the last 168 hours. Diabetic: No results for input(s): HGBA1C in the last 72 hours. No results for input(s): GLUCAP in the last 168 hours. Cardiac Enzymes:  No results for input(s): CKTOTAL, CKMB, CKMBINDEX, TROPONINI in the last 168 hours. No results for input(s): PROBNP in the last 8760 hours. Coagulation Profile: Recent Labs  Lab 04/18/24 1412  INR 1.0   Thyroid  Function Tests: No results for input(s): TSH, T4TOTAL, FREET4, T3FREE, THYROIDAB in the last 72 hours. Lipid Profile: No results for input(s): CHOL, HDL, LDLCALC, TRIG, CHOLHDL, LDLDIRECT in the last 72 hours. Anemia Panel: No results for input(s): VITAMINB12, FOLATE, FERRITIN, TIBC, IRON, RETICCTPCT in the last 72 hours. Urine analysis:    Component Value Date/Time   COLORURINE STRAW (A) 04/18/2024 1548   APPEARANCEUR HAZY (A) 04/18/2024 1548   APPEARANCEUR Cloudy (A) 02/05/2015 1116   LABSPEC 1.005 04/18/2024 1548   PHURINE 8.0 04/18/2024 1548   GLUCOSEU NEGATIVE 04/18/2024 1548   HGBUR NEGATIVE 04/18/2024 1548   BILIRUBINUR NEGATIVE 04/18/2024 1548   BILIRUBINUR Negative 02/05/2015 1116   KETONESUR NEGATIVE 04/18/2024 1548   PROTEINUR NEGATIVE 04/18/2024 1548   NITRITE NEGATIVE 04/18/2024 1548   LEUKOCYTESUR NEGATIVE 04/18/2024 1548   Sepsis Labs: Invalid input(s): PROCALCITONIN,  LACTICIDVEN  Microbiology: Recent Results (from the past 240 hours)  WET PREP FOR TRICH, YEAST, CLUE     Status: None   Collection Time: 04/16/24 12:29 PM   Specimen: Genital  Result Value Ref Range Status   Source: VAGINA  Final   RESULT   Final    Comment: EPITHELIAL CELLS-PRESENT CLUE CELLS-NONE SEEN YEAST-NONE SEEN TRICHOMONAS-NONE SEEN WBC-FEW BACTERIA-FEW EPITH. CELLS <6 PER HPF     Radiology Studies: No results found.     Sholonda Jobst T. Treonna Klee [facility]  If 7PM-7AM, please contact night-coverage [email or web address] 04/20/2024, 1:35 PM   "

## 2024-04-20 NOTE — Evaluation (Signed)
 Physical Therapy Evaluation Patient Details Name: Margaret [name] MRN: [id number] DOB: Feb 08, 1935 Today's Date: 04/20/2024  History of Present Illness  Pt is a 89 y/o female presenting on 12/25 after fall.  Found with nondisplaced femoral neck fx, s/p cannulated screw fixation of R hip 12/26. PMH includes: severe Alzheimer's dementia, osteoporosis, osteoarthritis, L THA, chronic pain, HTN.  Clinical Impression  Pt admitted as above and presents with problem list below.  Caregiver present and reports home setup/PLOF, pt needing assist for all ADLs but able to self feed and groom at times; using RW for mobility with hands on assist for safety.  Pt currently requires max +2 for bed mobility, mod +2 for transfers and max to total assist for all ADLs.  Pt following simple 1 step commands with increased time, but inconsistent; slow to respond and very limited verbalizations- caregiver reports pt fatigued from not sleeping well last night.  Pt has great support at home, and will plan to dc home with 24/7 support and HHPT services.  Will follow acutely.         If plan is discharge home, recommend the following: Two people to help with walking and/or transfers;A lot of help with bathing/dressing/bathroom;Assistance with cooking/housework;Assist for transportation;Help with stairs or ramp for entrance   Can travel by private vehicle        Equipment Recommendations None recommended by PT  Recommendations for Other Services       Functional Status Assessment Patient has had a recent decline in their functional status and demonstrates the ability to make significant improvements in function in a reasonable and predictable amount of time.     Precautions / Restrictions Precautions Precautions: Fall Recall of Precautions/Restrictions: Impaired Restrictions Weight Bearing Restrictions Per Provider Order: Yes RLE Weight Bearing Per Provider Order: Weight bearing as tolerated      Mobility   Bed Mobility Overal bed mobility: Needs Assistance Bed Mobility: Supine to Sit, Sit to Supine     Supine to sit: Max assist, +2 for physical assistance Sit to supine: Max assist, +2 for physical assistance   General bed mobility comments: Increased time with cues for sequence and assist to manage LEs and control trunk.  Extensive use of bed pad to assist    Transfers Overall transfer level: Needs assistance Equipment used: Rolling walker (2 wheels) Transfers: Sit to/from Stand Sit to Stand: Mod assist, +2 physical assistance           General transfer comment: mod assist +2 to fully power up and steady from elevated EOB.  cueing for hand placement and support for balance.  with increased time and support to shift weight, pt able to take several shuffling side-steps to [facility]    Ambulation/Gait               General Gait Details: Pt tolerated side-shuffle along EOB only  Stairs            Wheelchair Mobility     Tilt Bed    Modified Rankin (Stroke Patients Only)       Balance Overall balance assessment: Needs assistance Sitting-balance support: No upper extremity supported, Feet supported Sitting balance-Leahy Scale: Fair Sitting balance - Comments: statically min guard for safety   Standing balance support: Bilateral upper extremity supported, During functional activity, Reliant on assistive device for balance Standing balance-Leahy Scale: Poor Standing balance comment: relies on BUE and external support  Pertinent Vitals/Pain Pain Assessment Pain Assessment: Faces Faces Pain Scale: Hurts little more Pain Location: does not state- anticipate R hip Pain Descriptors / Indicators: Discomfort Pain Intervention(s): Limited activity within patient's tolerance, Monitored during session, Repositioned    Home Living Family/patient expects to be discharged to:: Private residence Living Arrangements: Other (Comment)  (24/7 caregivers) Available Help at Discharge: Personal care attendant Type of Home: House Home Access: Level entry       Home Layout: Able to live on main level with bedroom/bathroom Home Equipment: Rolling Walker (2 wheels);Rollator (4 wheels);Cane - single point;BSC/3in1;Shower seat;Grab bars - toilet;Grab bars - tub/shower;Hand held shower head;Wheelchair - manual;Hospital bed      Prior Function Prior Level of Function : Needs assist             Mobility Comments: pt using RW for mobility, has someone with her at all times ADLs Comments: pt needing assist for all ADls due to cognition, but is able to self feed and brush her teeth at times; wear depends     Extremity/Trunk Assessment   Upper Extremity Assessment Upper Extremity Assessment: Generalized weakness    Lower Extremity Assessment Lower Extremity Assessment: Generalized weakness;RLE deficits/detail       Communication   Communication Communication: Impaired Factors Affecting Communication: Difficulty expressing self;Reduced clarity of speech    Cognition Arousal: Alert Behavior During Therapy: Flat affect                             Following commands: Impaired Following commands impaired: Follows one step commands inconsistently     Cueing Cueing Techniques: Verbal cues, Visual cues     General Comments General comments (skin integrity, edema, etc.): Pt's home caregiver present    Exercises     Assessment/Plan    PT Assessment Patient needs continued PT services  PT Problem List Decreased strength;Decreased range of motion;Decreased activity tolerance;Decreased balance;Decreased mobility;Decreased knowledge of use of DME;Pain       PT Treatment Interventions DME instruction;Gait training;Stair training;Functional mobility training;Therapeutic activities;Therapeutic exercise;Patient/family education;Balance training    PT Goals (Current goals can be found in the Care Plan  section)  Acute Rehab PT Goals Patient Stated Goal: To regain PLOF per PCA present PT Goal Formulation: With family (PCA who has been with pt x 12 yrs) Time For Goal Achievement: 05/03/24 Potential to Achieve Goals: Fair    Frequency Min 5X/week     Co-evaluation PT/OT/SLP Co-Evaluation/Treatment: Yes Reason for Co-Treatment: For patient/therapist safety;To address functional/ADL transfers;Necessary to address cognition/behavior during functional activity PT goals addressed during session: Mobility/safety with mobility OT goals addressed during session: ADL's and self-care       AM-PAC PT 6 Clicks Mobility  Outcome Measure Help needed turning from your back to your side while in a flat bed without using bedrails?: A Lot Help needed moving from lying on your back to sitting on the side of a flat bed without using bedrails?: A Lot Help needed moving to and from a bed to a chair (including a wheelchair)?: A Lot Help needed standing up from a chair using your arms (e.g., wheelchair or bedside chair)?: A Lot Help needed to walk in hospital room?: Total Help needed climbing 3-5 steps with a railing? : Total 6 Click Score: 10    End of Session Equipment Utilized During Treatment: Gait belt Activity Tolerance: Patient limited by fatigue;Patient tolerated treatment well Patient left: in bed;with call bell/phone within reach;with nursing/sitter in room;with  family/visitor present Nurse Communication: Mobility status PT Visit Diagnosis: Unsteadiness on feet (R26.81);Muscle weakness (generalized) (M62.81);Difficulty in walking, not elsewhere classified (R26.2);Pain Pain - Right/Left: Right Pain - part of body: Hip    Time: 9154-9079 PT Time Calculation (min) (ACUTE ONLY): 35 min   Charges:   PT Evaluation $PT Eval Moderate Complexity: 1 Mod   PT General Charges $$ ACUTE PT VISIT: 1 Visit         [facility] PT Acute Rehabilitation Services Office  [phone]   Davier Tramell 04/20/2024, 2:05 PM

## 2024-04-20 NOTE — TOC Initial Note (Signed)
 Transition of Care [facility]) - Initial/Assessment Note    Patient Details  Name: Margaret [name] MRN: [id number] Date of Birth: 1935-01-08  Transition of Care [facility] CM/SW Contact:    Heather DELENA Saltness, LCSW Phone Number: 04/20/2024, 5:41 PM  Clinical Narrative:                 Pt from home for witnessed fall. Pt with history of Dementia with behavioral disturbance. Pt alert and oriented x2, with poor memory. Pt resides at home with 24/7 caregiver. CSW spoke with pt's daughter, Darice Canton [phone], and live-in caregiver, Neville Seltzer [phone], via phone call to discuss discharge planning and PT's recommendation for [facility] PT/OT and youth RW. Pt's daughter agreeable with [facility] services and youth RW. No HH/DME agency preference. HH PT/OT set up with [facility]. Youth RW to be delivered to bedside through [employer]. Will need HH orders prior to discharge. TOC will continue to follow.    Expected Discharge Plan: Home w Home Health Services Barriers to Discharge: Continued Medical Work up   Patient Goals and CMS Choice Patient states their goals for this hospitalization and ongoing recovery are:: To return home CMS Medicare.gov Compare Post Acute Care list provided to:: Patient Represenative (must comment) Choice offered to / list presented to : Adult Children Paia ownership interest in [facility].provided to:: Adult Children    Expected Discharge Plan and Services In-house Referral: Clinical Social Work Discharge Planning Services: NA Post Acute Care Choice: Home Health Living arrangements for the past 2 months: Single Family Home                 DME Arranged: Environmental Consultant youth DME Agency: [employer] Date DME Agency Contacted: 04/20/24 Time DME Agency Contacted: 1735 Representative spoke with at DME Agency: [place] HH Arranged: PT, OT HH Agency: [facility] ([facility]) Date HH Agency Contacted: 04/20/24 Time HH Agency Contacted: 1735 Representative  spoke with at [facility] Agency: [place]  Prior Living Arrangements/Services Living arrangements for the past 2 months: Single Family Home Lives with:: Self, Other (Comment) (Caregiver) Patient language and need for interpreter reviewed:: Yes Do you feel safe going back to the place where you live?: Yes      Need for Family Participation in Patient Care: Yes (Comment) Care giver support system in place?: Yes (comment) Current home services: DME Criminal Activity/Legal Involvement Pertinent to Current Situation/Hospitalization: No - Comment as needed  Activities of Daily Living   ADL Screening (condition at time of admission) Independently performs ADLs?: No Does the patient have a NEW difficulty with bathing/dressing/toileting/self-feeding that is expected to last >3 days?: Yes (Initiates electronic notice to provider for possible OT consult) Does the patient have a NEW difficulty with getting in/out of bed, walking, or climbing stairs that is expected to last >3 days?: Yes (Initiates electronic notice to provider for possible PT consult) Does the patient have a NEW difficulty with communication that is expected to last >3 days?: No Is the patient deaf or have difficulty hearing?: No Does the patient have difficulty seeing, even when wearing glasses/contacts?: No Does the patient have difficulty concentrating, remembering, or making decisions?: Yes  Permission Sought/Granted Permission sought to share information with : Family Supports Permission granted to share information with : Yes, Verbal Permission Granted  Share Information with NAME: Darice Canton and Neville Seltzer  Permission granted to share info w AGENCY: [facility] / DME  Permission granted to share info w Relationship: Daughter and caregiver  Permission granted to share  info w Contact Information: [phone]  Emotional Assessment Appearance:: Appears stated age Attitude/Demeanor/Rapport: Unable to Assess Affect (typically observed):  Unable to Assess Orientation: : Oriented to Self, Oriented to Place Alcohol  / Substance Use: Not Applicable Psych Involvement: No (comment)  Admission diagnosis:  Closed right hip fracture (HCC) [S72.001A] Closed fracture of neck of right femur, initial encounter (HCC) [S72.001A] Fall, initial encounter [W19.XXXA] Patient Active Problem List   Diagnosis Date Noted   Actinic keratosis 04/18/2024   Essential tremor 04/18/2024   Overactive bladder 04/18/2024   Closed right hip fracture (HCC) 04/18/2024   Shingles 01/05/2023   Pain in right foot 06/25/2020   Lumbar radiculopathy 05/04/2020   Recurrent major depression in remission 06/24/2019   Chronic low back pain 08/16/2017   Degeneration of lumbar intervertebral disc 08/16/2017   Pain in right knee 06/26/2017   Proteinuria 07/20/2016   S/P left THA, AA 09/01/2015   Mixed hyperlipidemia    Vitamin D  deficiency 05/04/2015   Age-related osteoporosis without current pathological fracture 05/01/2015   RUQ abdominal pain 02/23/2015   Nausea without vomiting 02/23/2015   Elevated LFTs 02/23/2015   Loss of appetite 02/23/2015   Dementia with behavioral disturbance (HCC) 02/04/2015   Abdominal pain 02/04/2015   Leaking of urine 02/04/2015   Dyspepsia 07/17/2013   Mild cognitive impairment 09/21/2012   Poison ivy dermatitis 08/27/2012   Osteopenia    Lichen sclerosus    ARM PAIN 06/30/2010   Hyperlipidemia 07/19/2008   Essential hypertension 07/19/2008   BRUIT 07/19/2008   PCP:  Loreli Elsie JONETTA Mickey., MD Pharmacy:   [id number] - [place], [place] - [address] AT [facility] [address] [place] [address] Phone: [phone] Fax: [phone]   Social Drivers of Health (SDOH) Social History: SDOH Screenings   Food Insecurity: No Food Insecurity (04/18/2024)  Housing: Low Risk (04/18/2024)  Transportation Needs: No Transportation Needs (04/18/2024)  Utilities: Not  At Risk (04/18/2024)  Social Connections: Moderately Isolated (04/18/2024)  Tobacco Use: Medium Risk (04/19/2024)   SDOH Interventions: None     Readmission Risk Interventions    04/20/2024    5:40 PM  Readmission Risk Prevention Plan  Post Dischage Appt Complete  Medication Screening Complete  Transportation Screening Complete    Signed: Heather Saltness, MSW, LCSW Clinical Social Worker Inpatient Care Management 04/20/2024 5:41 PM

## 2024-04-21 ENCOUNTER — Inpatient Hospital Stay (HOSPITAL_COMMUNITY)

## 2024-04-21 DIAGNOSIS — I10 Essential (primary) hypertension: Secondary | ICD-10-CM | POA: Diagnosis not present

## 2024-04-21 DIAGNOSIS — F03918 Unspecified dementia, unspecified severity, with other behavioral disturbance: Secondary | ICD-10-CM | POA: Diagnosis not present

## 2024-04-21 DIAGNOSIS — E7800 Pure hypercholesterolemia, unspecified: Secondary | ICD-10-CM | POA: Diagnosis not present

## 2024-04-21 DIAGNOSIS — F3340 Major depressive disorder, recurrent, in remission, unspecified: Secondary | ICD-10-CM | POA: Diagnosis not present

## 2024-04-21 DIAGNOSIS — M5416 Radiculopathy, lumbar region: Secondary | ICD-10-CM | POA: Diagnosis not present

## 2024-04-21 DIAGNOSIS — S72001A Fracture of unspecified part of neck of right femur, initial encounter for closed fracture: Secondary | ICD-10-CM | POA: Diagnosis not present

## 2024-04-21 DIAGNOSIS — E559 Vitamin D deficiency, unspecified: Secondary | ICD-10-CM | POA: Diagnosis not present

## 2024-04-21 LAB — RESPIRATORY PANEL BY PCR

## 2024-04-21 LAB — SARS CORONAVIRUS 2 BY RT PCR: SARS Coronavirus 2 by RT PCR: NEGATIVE

## 2024-04-21 LAB — CBC
HCT: 31.9 % — ABNORMAL LOW (ref 36.0–46.0)
Hemoglobin: 10.2 g/dL — ABNORMAL LOW (ref 12.0–15.0)
MCH: 32.5 pg (ref 26.0–34.0)
MCHC: 32 g/dL (ref 30.0–36.0)
MCV: 101.6 fL — ABNORMAL HIGH (ref 80.0–100.0)
Platelets: 213 K/uL (ref 150–400)
RBC: 3.14 MIL/uL — ABNORMAL LOW (ref 3.87–5.11)
RDW: 13.2 % (ref 11.5–15.5)
WBC: 13 K/uL — ABNORMAL HIGH (ref 4.0–10.5)
nRBC: 0 % (ref 0.0–0.2)

## 2024-04-21 MED ORDER — ASPIRIN 81 MG PO TBEC: 81.0000 mg | DELAYED_RELEASE_TABLET | Freq: Two times a day (BID) | ORAL | Status: DC

## 2024-04-21 NOTE — Progress Notes (Signed)
 " PROGRESS NOTE  Margaret [name] FMW:[id number] DOB: Nov 29, 1934   PCP: Loreli Elsie JONETTA Mickey., MD  Patient is from: Home.  Uses rolling walker and cane for mobility.  Has 24/7 care.  DOA: 04/18/2024 LOS: 3  Chief complaints Chief Complaint  Patient presents with   Fall     Brief Narrative / Interim history: 89 year old F with PMH of severe Alzheimer's dementia, osteoporosis, osteoarthritis, left THA, chronic pain, HTN and GERD brought to ED after she sustained a fall at home.  Reportedly slipped and fell on the floor and was unable to bear weight on right leg.  CT showed nondisplaced femoral neck fracture.  Orthopedic surgery consulted.  Patient underwent cannulated screw fixation of right hip fracture by Dr. Teresa on 04/19/2024.  Therapy recommended home health and DME after discussion with family  Subjective: Seen and examined earlier this morning.  No major events overnight of this morning.  Patient has no complaints but not a reliable historian.  Family and caregiver concerned about patient's mild temperature, sneezing and chest congestion.  They are anxious to take patient home.   Assessment and plan: Mechanical fall at home-reportedly slipped and fell on the floor.  Denies striking head or LOC.  No prodromes. Acute closed right hip fracture-noted on CT -S/p cannulated screw fixation of right hip fracture by Dr. Teresa on 12/26 -Ortho recs: -WBAT on RLE per Ortho. -Aspirin  81 mg twice daily for 6 weeks for VTE prophylaxis -Up with therapy -PT/OT.  Patient ambulates using walker and cane at baseline.  Alzheimer's dementia without behavioral disturbance: Awake but only oriented to self. -Reorientation and delirium precaution -Can sinew home meds including memantine  and galantamine  -Minimize sedating medications.  Osteoarthritis/lumbar radiculopathy-CT lumbar spine without acute finding.  On Butrans  patch, Celebrex , Tylenol  and gabapentin  at home -On Tylenol , oxycodone ,  Celebrex  and home Butrans  patch. -Continue home gabapentin .  Essential hypertension: Soft BP earlier this morning. -Hold home amlodipine  and ARB.  She has received dose earlier today -Pain control as above  URI symptoms: Family concerned about patient's temperature, sneezing, cough and chest congestion.  She has been on oxygen despite normal saturation.  - Wean off oxygen - Chest x-ray - RVP and COVID-19  Depression: Stable - Continue Lexapro   Leukocytosis: Likely demargination.  Improved without antibiotics. - Continue monitoring   Increase nutrient needs Body mass index is 22.82 kg/m. Nutrition Problem: Increased nutrient needs Etiology: hip fracture, post-op healing Signs/Symptoms: estimated needs Interventions: Ensure Enlive (each supplement provides 350kcal and 20 grams of protein), MVI  Stage I pressure injury: Caregiver reports redness on her bottoms.  Denies skin break.  Present on admission. Wound 04/19/24 1205 Pressure Injury Perineum Mid Stage 1 -  Intact skin with non-blanchable redness of a localized area usually over a bony prominence. (Active)     DVT prophylaxis:  enoxaparin  (LOVENOX ) injection 40 mg Start: 04/20/24 0800 SCDs Start: 04/19/24 1023  Code Status: DNR Family Communication: Updated patient's daughter and caregiver over the phone. Level of care: Med-Surg Status is: Inpatient Remains inpatient appropriate because: Right hip fracture, URI symptoms   Final disposition: Home with home health and DME.   35 minutes with more than 50% spent in reviewing records, counseling patient/family and coordinating care.  Consultants:  Orthopedic surgery  Procedures: 12/26-cannulated screw fixation of right hip fracture by Dr. Teresa  Microbiology summarized: None  Objective: Vitals:   04/20/24 1420 04/20/24 2303 04/21/24 0555 04/21/24 1246  BP: (!) 111/54 (!) 113/56 (!) 114/49 121/77  Pulse:  ROLLEN)  55 (!) 52 66  Resp:  16 16 18   Temp:  97.6 F  (36.4 C) (!) 97.4 F (36.3 C) 99.2 F (37.3 C)  TempSrc:   Oral   SpO2:  99% 100% 93%  Weight:      Height:        Examination:  GENERAL: No apparent distress.  Nontoxic. HEENT: MMM.  Vision and hearing grossly intact.  NECK: Supple.  No apparent JVD.  RESP:  No IWOB.  Fair aeration bilaterally. CVS:  RRR. Heart sounds normal.  ABD/GI/GU: BS+. Abd soft, NTND.  MSK/EXT:  Moves extremities. No apparent deformity. No edema.  SKIN: no apparent skin lesion or wound NEURO: AA.  Oriented to self.  No apparent focal neuro deficit. PSYCH: Calm. Normal affect.   Sch Meds:  Scheduled Meds:  acetaminophen   1,000 mg Oral Q8H   betamethasone  valerate ointment  1 Application Topical BID   buprenorphine   1 patch Transdermal Weekly   celecoxib   100 mg Oral BID   cholecalciferol   2,000 Units Oral Daily   cycloSPORINE   1 drop Both Eyes BID   docusate sodium   100 mg Oral BID   enoxaparin  (LOVENOX ) injection  40 mg Subcutaneous Q24H   escitalopram   10 mg Oral Daily   feeding supplement  237 mL Oral BID BM   fluticasone   1 spray Each Nare Daily   gabapentin   300 mg Oral Q0600   And   gabapentin   200 mg Oral Daily   And   gabapentin   200 mg Oral QHS   galantamine   16 mg Oral Q breakfast   ipratropium  2 spray Each Nare BID   memantine   28 mg Oral Daily   multivitamin with minerals  1 tablet Oral Daily   mupirocin  ointment  1 Application Nasal BID   polyethylene glycol  17 g Oral Daily   Continuous Infusions:   PRN Meds:.lip balm, metoCLOPramide  **OR** metoCLOPramide  (REGLAN ) injection, morphine  injection, naphazoline-glycerin , ondansetron  **OR** ondansetron  (ZOFRAN ) IV, oxyCODONE , oxyCODONE   Antimicrobials: Anti-infectives (From admission, onward)    Start     Dose/Rate Route Frequency Ordered Stop   04/19/24 1400  ceFAZolin  (ANCEF ) IVPB 2g/100 mL premix        2 g 200 mL/hr over 30 Minutes Intravenous Every 6 hours 04/19/24 1022 04/20/24 2143   04/19/24 0600  ceFAZolin  (ANCEF )  IVPB 2g/100 mL premix        2 g 200 mL/hr over 30 Minutes Intravenous On call to O.R. 04/18/24 1803 04/19/24 0732        I have personally reviewed the following labs and images: CBC: Recent Labs  Lab 04/18/24 1412 04/19/24 0334 04/20/24 0323 04/21/24 0329  WBC 24.7* 13.1* 16.4* 13.0*  HGB 13.6 12.1 11.0* 10.2*  HCT 41.6 36.8 33.1* 31.9*  MCV 100.2* 99.2 99.4 101.6*  PLT 307 280 247 213   BMP &GFR Recent Labs  Lab 04/18/24 1412 04/19/24 0334 04/20/24 0323  NA 139 141 138  K 3.6 3.9 4.1  CL 103 105 101  CO2 27 27 26   GLUCOSE 110* 100* 133*  BUN 10 16 25*  CREATININE 0.44 0.68 0.89  CALCIUM  9.3 9.3 9.5  MG  --   --  2.3  PHOS  --   --  3.7   Estimated Creatinine Clearance: 30.4 mL/min (by C-G formula based on SCr of 0.89 mg/dL). Liver & Pancreas: Recent Labs  Lab 04/19/24 0334 04/20/24 0323  AST 20  --   ALT 11  --  ALKPHOS 59  --   BILITOT 0.5  --   PROT 5.9*  --   ALBUMIN 3.7 3.5   No results for input(s): LIPASE, AMYLASE in the last 168 hours. No results for input(s): AMMONIA in the last 168 hours. Diabetic: No results for input(s): HGBA1C in the last 72 hours. No results for input(s): GLUCAP in the last 168 hours. Cardiac Enzymes: No results for input(s): CKTOTAL, CKMB, CKMBINDEX, TROPONINI in the last 168 hours. No results for input(s): PROBNP in the last 8760 hours. Coagulation Profile: Recent Labs  Lab 04/18/24 1412  INR 1.0   Thyroid  Function Tests: No results for input(s): TSH, T4TOTAL, FREET4, T3FREE, THYROIDAB in the last 72 hours. Lipid Profile: No results for input(s): CHOL, HDL, LDLCALC, TRIG, CHOLHDL, LDLDIRECT in the last 72 hours. Anemia Panel: No results for input(s): VITAMINB12, FOLATE, FERRITIN, TIBC, IRON, RETICCTPCT in the last 72 hours. Urine analysis:    Component Value Date/Time   COLORURINE STRAW (A) 04/18/2024 1548   APPEARANCEUR HAZY (A) 04/18/2024 1548    APPEARANCEUR Cloudy (A) 02/05/2015 1116   LABSPEC 1.005 04/18/2024 1548   PHURINE 8.0 04/18/2024 1548   GLUCOSEU NEGATIVE 04/18/2024 1548   HGBUR NEGATIVE 04/18/2024 1548   BILIRUBINUR NEGATIVE 04/18/2024 1548   BILIRUBINUR Negative 02/05/2015 1116   KETONESUR NEGATIVE 04/18/2024 1548   PROTEINUR NEGATIVE 04/18/2024 1548   NITRITE NEGATIVE 04/18/2024 1548   LEUKOCYTESUR NEGATIVE 04/18/2024 1548   Sepsis Labs: Invalid input(s): PROCALCITONIN, LACTICIDVEN  Microbiology: Recent Results (from the past 240 hours)  WET PREP FOR TRICH, YEAST, CLUE     Status: None   Collection Time: 04/16/24 12:29 PM   Specimen: Genital  Result Value Ref Range Status   Source: VAGINA  Final   RESULT   Final    Comment: EPITHELIAL CELLS-PRESENT CLUE CELLS-NONE SEEN YEAST-NONE SEEN TRICHOMONAS-NONE SEEN WBC-FEW BACTERIA-FEW EPITH. CELLS <6 PER HPF     Radiology Studies: DG Chest Port 1 View Result Date: 04/21/2024 CLINICAL DATA:  Fever, cough and not feeling well. EXAM: PORTABLE CHEST 1 VIEW COMPARISON:  April 18, 2024 FINDINGS: The heart size and mediastinal contours are within normal limits. Low lung volumes are noted with mild elevation of the left hemidiaphragm. Both lungs are clear. There is stable scoliosis of the thoracic spine with multilevel degenerative changes noted. IMPRESSION: No active disease. Electronically Signed   By: Suzen Dials M.D.   On: 04/21/2024 14:45       Matilde Markie T. Vielka Klinedinst [facility]  If 7PM-7AM, please contact night-coverage [email or web address] 04/21/2024, 3:56 PM   "

## 2024-04-21 NOTE — TOC Transition Note (Addendum)
 Transition of Care [facility]) - Discharge Note   Patient Details  Name: Margaret [name] MRN: [id number] Date of Birth: 11-13-1934  Transition of Care [facility]) CM/SW Contact:  Heather DELENA Saltness, LCSW Phone Number: 04/21/2024, 10:31 AM   Clinical Narrative:    Pt discharging home with [facility] PT/OT services. HH PT/OT set up with [facility]. HH orders placed. Youth RW delivered to bedside by [employer]. Pt's caregiver/daughter will provide transportation home upon discharge. No further TOC needs at this time. Please consult TOC again if new needs arise.   Final next level of care: Home w Home Health Services Barriers to Discharge: Barriers Resolved   Patient Goals and CMS Choice Patient states their goals for this hospitalization and ongoing recovery are:: To return home CMS Medicare.gov Compare Post Acute Care list provided to:: Patient Represenative (must comment) Choice offered to / list presented to : Adult Children [place] ownership interest in [facility].provided to:: Adult Children    Discharge Placement Home  Patient to be transferred to facility by: Caregiver Name of family member notified: Darice Canton Patient and family notified of of transfer: 04/21/24  Discharge Plan and Services Additional resources added to the After Visit Summary for  Follow Up In-house Referral: Clinical Social Work Discharge Planning Services: NA Post Acute Care Choice: Home Health          DME Arranged: [facility] youth DME Agency: [employer] Date DME Agency Contacted: 04/20/24 Time DME Agency Contacted: [phone] Representative spoke with at DME Agency: [place] HH Arranged: PT, OT HH Agency: [facility] ([facility] Date HH Agency Contacted: 04/20/24 Time HH Agency Contacted: [phone] Representative spoke with at [facility] Agency: [place]  Social Drivers of Health (SDOH) Interventions SDOH Screenings   Food Insecurity: No Food Insecurity (04/18/2024)  Housing: Low Risk (04/18/2024)   Transportation Needs: No Transportation Needs (04/18/2024)  Utilities: Not At Risk (04/18/2024)  Social Connections: Moderately Isolated (04/18/2024)  Tobacco Use: Medium Risk (04/19/2024)     Readmission Risk Interventions    04/21/2024   10:30 AM 04/20/2024    5:40 PM  Readmission Risk Prevention Plan  Post Dischage Appt Complete Complete  Medication Screening Complete Complete  Transportation Screening Complete Complete    Signed: Heather Saltness, MSW, LCSW Clinical Social Worker Inpatient Care Management 04/21/2024 10:33 AM

## 2024-04-21 NOTE — Plan of Care (Signed)
" °  Problem: Clinical Measurements: Goal: Will remain free from infection Outcome: Progressing Goal: Respiratory complications will improve Outcome: Progressing Goal: Cardiovascular complication will be avoided Outcome: Progressing   Problem: Nutrition: Goal: Adequate nutrition will be maintained Outcome: Progressing   Problem: Coping: Goal: Level of anxiety will decrease Outcome: Progressing   Problem: Elimination: Goal: Will not experience complications related to bowel motility Outcome: Progressing   Problem: Pain Managment: Goal: General experience of comfort will improve and/or be controlled Outcome: Progressing   Problem: Safety: Goal: Ability to remain free from injury will improve Outcome: Progressing   Problem: Skin Integrity: Goal: Risk for impaired skin integrity will decrease Outcome: Progressing   "

## 2024-04-21 NOTE — Plan of Care (Signed)
   Problem: Activity: Goal: Risk for activity intolerance will decrease Outcome: Progressing   Problem: Nutrition: Goal: Adequate nutrition will be maintained Outcome: Progressing   Problem: Elimination: Goal: Will not experience complications related to urinary retention Outcome: Progressing   Problem: Pain Managment: Goal: General experience of comfort will improve and/or be controlled Outcome: Progressing

## 2024-04-21 NOTE — Progress Notes (Signed)
 Physical Therapy Treatment Patient Details Name: Margaret [name] MRN: [id number] DOB: Oct 23, 1934 Today's Date: 04/21/2024   History of Present Illness Pt is a 89 y/o female presenting on 12/25 after fall.  Found with nondisplaced femoral neck fx, s/p cannulated screw fixation of R hip 12/26. PMH includes: severe Alzheimer's dementia, osteoporosis, osteoarthritis, L THA, chronic pain, HTN.    PT Comments  Pt supine in bed on arrival this session.  Pt continues to benefit from skilled rehab to maximize functional gains and decrease caregiver burden.  Pt is more alert and participatory this session.  Pt incontinent of stool this session.  Require mod assistance overall.  Plan for d/c this pm.      If plan is discharge home, recommend the following: Two people to help with walking and/or transfers;A lot of help with bathing/dressing/bathroom;Assistance with cooking/housework;Assist for transportation;Help with stairs or ramp for entrance   Can travel by private vehicle        Equipment Recommendations  Rolling walker (2 wheels) (youth height RW)    Recommendations for Other Services       Precautions / Restrictions Precautions Precautions: Fall Recall of Precautions/Restrictions: Impaired Restrictions RLE Weight Bearing Per Provider Order: Weight bearing as tolerated     Mobility  Bed Mobility Overal bed mobility: Needs Assistance Bed Mobility: Supine to Sit     Supine to sit: Max assist     General bed mobility comments: Max assistance to move to edge of bed and use of bed pad to scoot to the edge of the bed in preparation for transfer.  Pt able to use PTA as a railing to pull trunk into sitting.    Transfers Overall transfer level: Needs assistance Equipment used: Rolling walker (2 wheels) (youth height) Transfers: Sit to/from Stand, Bed to chair/wheelchair/BSC Sit to Stand: Mod assist Stand pivot transfers: Mod assist   Squat pivot transfers: Mod assist      General transfer comment: Pt continues to require mod assistance to rise into standing this session.  Pt required max VCs for forward weight shifting and hand placement.  Verbalized stool incontinence and need to use BSC.  Pt able to stand in kyphotic flexed posturing for pericare.  Pt then able to take steps from commode to recliner.    Ambulation/Gait Ambulation/Gait assistance: Mod assist Gait Distance (Feet): 6 Feet Assistive device: Rolling walker (2 wheels) (youth height) Gait Pattern/deviations: Step-to pattern, Shuffle, Trunk flexed       General Gait Details: Cues for sequencing and posture.  Assistance to turn and back with RW.  Pt slow and guarded.   Stairs             Wheelchair Mobility     Tilt Bed    Modified Rankin (Stroke Patients Only)       Balance Overall balance assessment: Needs assistance Sitting-balance support: No upper extremity supported, Feet supported Sitting balance-Leahy Scale: Fair       Standing balance-Leahy Scale: Poor                              Communication Communication Communication: Impaired  Cognition Arousal: Alert Behavior During Therapy: Flat affect                             Following commands: Impaired Following commands impaired: Follows one step commands inconsistently    Cueing Cueing Techniques: Verbal cues, Visual cues  Exercises General Exercises - Lower Extremity Ankle Circles/Pumps: AROM, Both, 10 reps, Supine Heel Slides: AAROM, Right, 10 reps, Supine Hip ABduction/ADduction: AAROM, Right, 10 reps, Supine    General Comments        Pertinent Vitals/Pain Pain Assessment Pain Assessment: Faces Faces Pain Scale: Hurts little more Pain Location: R leg Pain Descriptors / Indicators: Discomfort, Sore Pain Intervention(s): Monitored during session, Repositioned    Home Living                          Prior Function            PT Goals (current goals can  now be found in the care plan section) Acute Rehab PT Goals Patient Stated Goal: To regain PLOF per PCA present Potential to Achieve Goals: Fair    Frequency    Min 5X/week      PT Plan      Co-evaluation              AM-PAC PT 6 Clicks Mobility   Outcome Measure  Help needed turning from your back to your side while in a flat bed without using bedrails?: A Lot Help needed moving from lying on your back to sitting on the side of a flat bed without using bedrails?: A Lot Help needed moving to and from a bed to a chair (including a wheelchair)?: A Lot Help needed standing up from a chair using your arms (e.g., wheelchair or bedside chair)?: A Lot Help needed to walk in hospital room?: A Lot Help needed climbing 3-5 steps with a railing? : A Lot 6 Click Score: 12    End of Session Equipment Utilized During Treatment: Gait belt Activity Tolerance: Patient limited by fatigue;Patient tolerated treatment well Patient left: with call bell/phone within reach;in chair;with chair alarm set Nurse Communication: Mobility status PT Visit Diagnosis: Unsteadiness on feet (R26.81);Muscle weakness (generalized) (M62.81);Difficulty in walking, not elsewhere classified (R26.2);Pain Pain - Right/Left: Right Pain - part of body: Hip     Time: 1127-1200 PT Time Calculation (min) (ACUTE ONLY): 33 min  Charges:    $Therapeutic Exercise: 8-22 mins $Therapeutic Activity: 8-22 mins PT General Charges $$ ACUTE PT VISIT: 1 Visit                     Toya HAMS , PTA Acute Rehabilitation Services Office [phone]    Advith Martine JINNY Gosling 04/21/2024, 12:10 PM

## 2024-04-22 ENCOUNTER — Telehealth: Payer: Self-pay

## 2024-04-22 ENCOUNTER — Telehealth: Payer: Self-pay | Admitting: Pediatrics

## 2024-04-22 LAB — CBC
HCT: 33.6 % — ABNORMAL LOW (ref 36.0–46.0)
Hemoglobin: 10.8 g/dL — ABNORMAL LOW (ref 12.0–15.0)
MCH: 32.4 pg (ref 26.0–34.0)
MCHC: 32.1 g/dL (ref 30.0–36.0)
MCV: 100.9 fL — ABNORMAL HIGH (ref 80.0–100.0)
Platelets: 237 K/uL (ref 150–400)
RBC: 3.33 MIL/uL — ABNORMAL LOW (ref 3.87–5.11)
RDW: 13 % (ref 11.5–15.5)
WBC: 11.7 K/uL — ABNORMAL HIGH (ref 4.0–10.5)
nRBC: 0 % (ref 0.0–0.2)

## 2024-04-22 LAB — RENAL FUNCTION PANEL
Albumin: 3.6 g/dL (ref 3.5–5.0)
Anion gap: 7 (ref 5–15)
BUN: 24 mg/dL — ABNORMAL HIGH (ref 8–23)
CO2: 31 mmol/L (ref 22–32)
Calcium: 9.2 mg/dL (ref 8.9–10.3)
Chloride: 106 mmol/L (ref 98–111)
Creatinine, Ser: 0.63 mg/dL (ref 0.44–1.00)
GFR, Estimated: >60 mL/min
Glucose, Bld: 98 mg/dL (ref 70–99)
Phosphorus: 3.8 mg/dL (ref 2.5–4.6)
Potassium: 4.4 mmol/L (ref 3.5–5.1)
Sodium: 144 mmol/L (ref 135–145)

## 2024-04-22 LAB — MAGNESIUM: Magnesium: 2.5 mg/dL — ABNORMAL HIGH (ref 1.7–2.4)

## 2024-04-22 NOTE — Plan of Care (Signed)
  Problem: Safety: Goal: Ability to remain free from injury will improve Outcome: Progressing   Problem: Pain Managment: Goal: General experience of comfort will improve and/or be controlled Outcome: Progressing   Problem: Elimination: Goal: Will not experience complications related to urinary retention Outcome: Progressing   Problem: Coping: Goal: Level of anxiety will decrease Outcome: Progressing   Problem: Clinical Measurements: Goal: Ability to maintain clinical measurements within normal limits will improve Outcome: Progressing

## 2024-04-22 NOTE — Telephone Encounter (Signed)
 Procedure:EGD Procedure date: 04/30/23 Procedure location: [facility] Arrival Time: 6:05 Spoke with the patient Y/N: Y Any prep concerns? N Has the patient obtained the prep from the pharmacy ? N Do you have a care partner and transportation: Y Any additional concerns? Y  I called patient and spoke to her caregiver which informed me that [facility] had fail and fractured her hip and had to have surgery. She stated that the procedure need to be resheduled.

## 2024-04-22 NOTE — Discharge Summary (Signed)
 "  Physician Discharge Summary  Margaret [name] FMW:[id number] DOB: 03/06/1935 DOA: 04/18/2024  PCP: Loreli Elsie JONETTA Mickey., MD  Admit date: 04/18/2024 Discharge date: 04/22/2024  Admitted From: Home Disposition: Home Recommendations for Outpatient Follow-up:  Outpatient follow-up with PCP and orthopedic surgery as below Check CMP and CBC at follow-up Please follow up on the following pending results: None  Home Health: HHPT/OT Equipment/Devices: Rolling walker  Discharge Condition: Stable CODE STATUS: DNR   Contact information for follow-up providers     [facility] Emergency Department at [facility]. Go to .   Specialty: Emergency Medicine Why: If symptoms worsen Contact information: [address] [place]  [address] [phone]        Teresa Rankin ORN, MD Follow up in 2 week(s).   Specialty: Orthopedic Surgery Contact information: [address] [place] [address] [phone]         Loreli Elsie JONETTA Mickey., MD. Schedule an appointment as soon as possible for a visit in 1 week(s).   Specialty: Internal Medicine Contact information: [address] [place] [address] [phone]              Contact information for after-discharge care     Home Medical Care     [facility] [place] [facility]) .   Service: Home Health Services Why: This provider will reach out to you 24-48 hours after discharge to begin Home Health PT/OT services. Contact information: [address] [place]  [phone]                     Hospital course 89 year old F with PMH of severe Alzheimer's dementia, osteoporosis, osteoarthritis, left THA, chronic pain, HTN and GERD brought to ED after she sustained a fall at home.  Reportedly slipped and fell on the floor and was unable to bear weight on right leg.  CT showed nondisplaced femoral neck fracture.  Orthopedic  surgery consulted.   Patient underwent cannulated screw fixation of right hip fracture by Dr. Teresa on 04/19/2024.  Therapy recommended home health and DME after discussion with family.  She will be on aspirin  81 mg twice daily for 6 weeks for VTE prophylaxis. Outpatient follow-up with orthopedic surgery and PCP as above.    See individual problem list below for more.   Problems addressed during this hospitalization Mechanical fall at home-reportedly slipped and fell on the floor.  Denies striking head or LOC.  No prodromes. Acute closed right hip fracture-noted on CT -S/p cannulated screw fixation of right hip fracture by Dr. Teresa on 12/26 -Ortho recs: -WBAT on RLE per Ortho. -Aspirin  81 mg twice daily for 6 weeks for VTE prophylaxis -Up with therapy - HHPT/OT/rolling walker ordered.  Patient has 24/7 in-house care.   Alzheimer's dementia without behavioral disturbance: Awake but only oriented to self. -Reorientation and delirium precaution -Continue home meds. -Minimize sedating medications.   Osteoarthritis/lumbar radiculopathy-CT lumbar spine without acute finding.  On Butrans  patch, Celebrex , Tylenol  and gabapentin  at home.  Continue Tylenol ,, gabapentin , oxycodone , Celebrex  and Butrans  patch.   Essential hypertension: Normotensive off home antihypertensive meds. -Hold home amlodipine  and ARB unless BP elevated - Reassess BP at follow-up.   URI symptoms: Likely from nasal cannula.  COVID-19 and 20 pathogen RVP nonreactive.  CXR without acute finding.  Saturating in upper 90s on RA.    Depression: Stable - Continue Lexapro    Leukocytosis: Likely demargination.  Resolving without antibiotics -  Recheck CBC at follow-up   Increase nutrient needs Body mass index is 22.82 kg/m. Nutrition Problem: Increased nutrient needs Etiology: hip fracture, post-op healing Signs/Symptoms: estimated needs Interventions: Ensure Enlive (each supplement provides 350kcal and 20 grams of  protein), MVI  Wound 04/19/24 1205 Pressure Injury Perineum Mid Stage 1 -  Intact skin with non-blanchable redness of a localized area usually over a bony prominence. (Active)    Consultations: Orthopedic surgery  Time spent 35  minutes  Vital signs Vitals:   04/21/24 0555 04/21/24 1246 04/21/24 2010 04/22/24 0610  BP: (!) 114/49 121/77 117/65 124/68  Pulse: (!) 52 66 73 (!) 56  Temp: (!) 97.4 F (36.3 C) 99.2 F (37.3 C) 98.9 F (37.2 C) 98.3 F (36.8 C)  Resp: 16 18 16 18   Height:      Weight:      SpO2: 100% 93% 94% 99%  TempSrc: Oral  Oral   BMI (Calculated):         Discharge exam  GENERAL: No apparent distress.  Nontoxic. HEENT: MMM.  Vision and hearing grossly intact.  NECK: Supple.  No apparent JVD.  RESP:  No IWOB.  Fair aeration bilaterally. CVS:  RRR. Heart sounds normal.  ABD/GI/GU: BS+. Abd soft, NTND.  MSK/EXT:  Moves extremities. No apparent deformity. No edema.  SKIN: no apparent skin lesion or wound NEURO: Awake and alert. Oriented to self.  No apparent focal neuro deficit. PSYCH: Calm. Normal affect.   Discharge Instructions  Allergies as of 04/22/2024       Reactions   Amoxicillin -pot Clavulanate    Other Reaction(s): severe diarrhea in 2022   Sulfa Antibiotics Other (See Comments)   Reaction unknown   Denosumab  Rash   Lidocaine  Rash        Medication List     PAUSE taking these medications    amLODipine  5 MG tablet Wait to take this until: April 28, 2024 Commonly known as: NORVASC  Take 1 tablet by mouth daily.   olmesartan 20 MG tablet Wait to take this until: April 28, 2024 Commonly known as: BENICAR Take 20 mg by mouth daily.       TAKE these medications    aspirin  EC 81 MG tablet Take 1 tablet (81 mg total) by mouth 2 (two) times daily. Swallow whole.   betamethasone  valerate ointment 0.1 % Commonly known as: VALISONE  Apply 1 Application topically 2 (two) times daily. Place in a thin layer to skin twice daily  for 2 weeks and then use twice a week at bedtime.   buprenorphine  20 MCG/HR Ptwk Commonly known as: BUTRANS  Place 1 patch onto the skin once a week.   celecoxib  100 MG capsule Commonly known as: CELEBREX  Take 100 mg by mouth 2 (two) times daily.   cetirizine  10 MG tablet Commonly known as: ZYRTEC  Take 10 mg by mouth daily as needed.   cycloSPORINE  0.05 % ophthalmic emulsion Commonly known as: RESTASIS  Place 1 drop into both eyes 2 (two) times daily.   diclofenac sodium 1 % Gel Commonly known as: VOLTAREN Apply 1 application topically 3 (three) times daily as needed (for back pain).   docusate sodium  50 MG capsule Commonly known as: COLACE Take 50 mg by mouth daily.   escitalopram  10 MG tablet Commonly known as: LEXAPRO  Take 10 mg by mouth daily. What changed: Another medication with the same name was changed. Make sure you understand how and when to take each.   escitalopram  5 MG tablet Commonly known as: LEXAPRO  TAKE  1 TABLET BY MOUTH DAILY*CALL OFFICE TO MAKE APPOINTMENT* What changed: See the new instructions.   fluconazole 200 MG tablet Commonly known as: DIFLUCAN Take 1 tablet by mouth daily as needed.   fluticasone  50 MCG/ACT nasal spray Commonly known as: FLONASE  Place 1 spray into both nostrils in the morning and at bedtime.   gabapentin  100 MG capsule Commonly known as: NEURONTIN  Take 200-300 mg by mouth See admin instructions. Take 300 mg morning, 200 mg at 3 pm & 200 mg at bedtime   galantamine  24 MG 24 hr capsule Commonly known as: RAZADYNE  ER TAKE 1 CAPSULE BY MOUTH EVERY DAY What changed:  how much to take when to take this   ipratropium 0.03 % nasal spray Commonly known as: ATROVENT  Place 2 sprays into both nostrils 2 (two) times daily.   multivitamin with minerals Tabs tablet Take 1 tablet by mouth daily.   Namenda  XR 28 MG Cp24 24 hr capsule Generic drug: memantine  TAKE 1 CAPSULE(28 MG) BY MOUTH DAILY What changed: See the new  instructions.   nystatin  powder Commonly known as: MYCOSTATIN /NYSTOP  Apply 1 Application topically 3 (three) times daily. Apply to affected area for up to 7 days   nystatin -triamcinolone  ointment Commonly known as: MYCOLOG Apply 1 application topically 2 (two) times daily. Apply BID for up to 7 days.   oxyCODONE  5 MG immediate release tablet Commonly known as: Oxy IR/ROXICODONE  Take 1 tablet (5 mg total) by mouth once as needed (for pain score of 1-4).   polyethylene glycol 17 g packet Commonly known as: MIRALAX  / GLYCOLAX  Take 17 g by mouth daily.   PROBIOTIC PO Take 1 capsule by mouth daily.   Tylenol  8 Hour 650 MG CR tablet Generic drug: acetaminophen  Take 1,300 mg by mouth every 8 (eight) hours as needed for pain.   Vitamin D3 50 MCG (2000 UT) Tabs Take 2,000 Units by mouth daily.         Procedures/Studies: Cannulated screw fixation of right hip fracture by Dr. Teresa on 04/19/2024.    DG Chest Port 1 View Result Date: 04/21/2024 CLINICAL DATA:  Fever, cough and not feeling well. EXAM: PORTABLE CHEST 1 VIEW COMPARISON:  April 18, 2024 FINDINGS: The heart size and mediastinal contours are within normal limits. Low lung volumes are noted with mild elevation of the left hemidiaphragm. Both lungs are clear. There is stable scoliosis of the thoracic spine with multilevel degenerative changes noted. IMPRESSION: No active disease. Electronically Signed   By: Suzen Dials M.D.   On: 04/21/2024 14:45   DG HIP UNILAT WITH PELVIS 1V RIGHT Result Date: 04/19/2024 CLINICAL DATA:  Elective surgery. EXAM: DG HIP (WITH OR WITHOUT PELVIS) 1V RIGHT COMPARISON:  Radiograph yesterday FINDINGS: Three fluoroscopic spot views of the right hip submitted from the operating room. Three screws traverse the femoral neck. Fluoroscopy time 54 seconds. Dose 6.5 mGy. Previous left hip arthroplasty. IMPRESSION: Intraoperative fluoroscopy during right hip pinning. Electronically Signed   By:  Andrea Gasman M.D.   On: 04/19/2024 14:06   DG C-Arm 1-60 Min-No Report Result Date: 04/19/2024 Fluoroscopy was utilized by the requesting physician.  No radiographic interpretation.   DG HIP UNILAT WITH PELVIS 2-3 VIEWS RIGHT Result Date: 04/18/2024 EXAM: 2 OR MORE VIEW(S) XRAY OF THE BILATERAL HIP 04/18/2024 02:01:00 PM COMPARISON: None available. CLINICAL HISTORY: Pain FINDINGS: BONES AND JOINTS: Partially visualized left hip arthroplasty in place. Foreshortening and irregularity of the right femoral neck concerning for an acute impacted fracture - Limited evaluation due  to overlapping osseous structures and overlying soft tissues. Moderate degenerative changes of the right hip with joint space narrowing and marginal spurring. LUMBAR SPINE: Degenerative disc disease of the visualized lower lumbar spine. SOFT TISSUES: The soft tissues are unremarkable. IMPRESSION: 1. Foreshortening and irregularity of the right femoral neck concerning for an acute impacted fracture. Limited evaluation due to overlapping osseous structures and overlying soft tissues. Recommend CT. Electronically signed by: Morgane Naveau MD 04/18/2024 02:13 PM EST RP Workstation: [id number]   DG Chest 1 View Result Date: 04/18/2024 EXAM: 1 VIEW(S) XRAY OF THE CHEST 04/18/2024 02:01:00 PM COMPARISON: None available. CLINICAL HISTORY: Pre-op exam FINDINGS: LUNGS AND PLEURA: No focal pulmonary opacity. No pleural effusion. No pneumothorax. HEART AND MEDIASTINUM: No acute abnormality of the cardiac and mediastinal silhouettes. BONES AND SOFT TISSUES: Multilevel degenerative disc disease of thoracic spine. Dextrocurvature of thoracolumbar junction. Degenerative changes of the bilateral shoulders. IMPRESSION: 1. No acute findings. Electronically signed by: Morgane Naveau MD 04/18/2024 02:11 PM EST RP Workstation: [id number]   CT PELVIS WO CONTRAST Result Date: 04/18/2024 CLINICAL DATA:  Status post trauma. EXAM: CT PELVIS WITHOUT  CONTRAST TECHNIQUE: Multidetector CT imaging of the pelvis was performed following the standard protocol without intravenous contrast. RADIATION DOSE REDUCTION: This exam was performed according to the departmental dose-optimization program which includes automated exposure control, adjustment of the mA and/or kV according to patient size and/or use of iterative reconstruction technique. COMPARISON:  November 16, 2020 FINDINGS: Urinary Tract:  No abnormality visualized. Bowel: There is no evidence of bowel dilatation. Noninflamed diverticula are seen throughout the sigmoid colon. A large stool burden is noted. Vascular/Lymphatic: No pathologically enlarged lymph nodes. Mild atherosclerotic disease is seen. Reproductive:  No mass or other significant abnormality Other:  None. Musculoskeletal: There is a total left hip replacement. Associated streak artifact is seen with subsequently limited evaluation of the adjacent osseous and soft tissue structures. A chronic fracture deformity is seen involving the junction of the head and neck of the proximal right femur. An additional small nondisplaced acute fracture is present within this region (best seen on coronal reformatted images 67 through 71, CT series 9). There is no evidence of dislocation. Marked severity degenerative changes are present throughout the right hip in the form of joint space narrowing, acetabular sclerosis and lateral acetabular bony spurring. Marked severity degenerative changes are present within the visualized portion of the lower lumbar spine. IMPRESSION: 1. Chronic fracture deformity involving the junction of the head and neck of the proximal right femur with an additional small nondisplaced acute fracture involving the right femoral neck. 2. Total left hip replacement. 3. Marked severity degenerative changes within the right hip and visualized portion of the lower lumbar spine. 4. Sigmoid diverticulosis. 5. Aortic atherosclerosis. Electronically  Signed   By: Suzen Dials M.D.   On: 04/18/2024 12:20   CT Lumbar Spine Wo Contrast Result Date: 04/18/2024 EXAM: CT OF THE LUMBAR SPINE WITHOUT CONTRAST 04/18/2024 11:54:56 AM TECHNIQUE: CT of the lumbar spine was performed without the administration of intravenous contrast. Multiplanar reformatted images are provided for review. Automated exposure control, iterative reconstruction, and/or weight based adjustment of the mA/kV was utilized to reduce the radiation dose to as low as reasonably achievable. COMPARISON: Lumbar MRI 05/21/2019. CT abdomen and pelvis 11/16/2020. CLINICAL HISTORY: 89 year old female. Back trauma FINDINGS: Normal lumbar segmentation. BONES AND ALIGNMENT: Chronic severe thoracolumbar scoliosis with levoconvex lumbar curvature. Intact visible sacrum and SI joints. Maintained vertebral body height. No acute osseous abnormality. SOFT TISSUES: No acute lumbar  paraspinal soft tissue finding. Visible non-contrast lower chest and abdominal viscera stable compared to 2022. Small chronic fat containing Bochdalek hernia at the left costophrenic angle (normal variant). DEGENERATIVE CHANGES: Chronic severe lumbar disc, endplate, and facet degeneration throughout. Despite the chronic severe lumbar spine degeneration and advanced underlying scoliosis, by CT the most pronounced level of spinal stenosis is in the lower thoracic spine at T11-T12 (mild to moderate on series 9 image 23). IMPRESSION: 1. No acute traumatic injury identified in the lumbar spine. 2. Chronic severe lower thoracic and lumbar degeneration superimposed on advanced scoliosis. 3. By CT the most pronounced level of spinal stenosis appears to be at T11-T12. Electronically signed by: Helayne Hurst MD 04/18/2024 12:07 PM EST RP Workstation: [id number]   ARCOLA ROYLENE LIEN OP MEDICARE SPEECH PATH Result Date: 04/05/2024 Table formatting from the original result was not included. Modified Barium Swallow Study Patient Details Name:  MISHIKA FLIPPEN MRN: [id number] Date of Birth: Aug 04, 1934 Today's Date: 04/05/2024 HPI/PMH: HPI: Cecillia Menees is an 12 yof who was referred for OP MBS due to worsening dysphagia with liquids and large pills. Esophagram 03/06/24 showed mild dysmotility, smooth tapering at GE junction with lodging of barium pill. EGD 02/09/24 small HH, wide esophageal ring at GE junction; PMHx includes Alzheimer's dz, worsening cognition. [name] She was recently treated with antibiotics for pneumonia, raising concerns about potential aspiration. Clinical Impression: Clinical Impression: Pt presents with a normal oropharyngeal swallow.  There was good oral control/propulsion through oral cavity, reliable airway protection (no penetration, no aspiration), efficient transfer of all solids and liquids through pharynx.  No residue s/p swallow.  Discussed results with caregiver, Neville, after the study.  Pt may occasionally have coughing episodes that accompany eating.  We would anticipate that swallowing would, at some point, begin to falter with the advancement of her dementia. However, today her physiology is excellent.  Recommend continuing current diet.  No SLP f/u is needed. Factors that may increase risk of adverse event in presence of aspiration Noe & Lianne 2021): none! Recommendations/Plan: Swallowing Evaluation Recommendations Swallowing Evaluation Recommendations Recommendations: PO diet PO Diet Recommendation: Regular; Thin liquids (Level 0) Liquid Administration via: Straw; Cup Medication Administration: Whole meds with liquid Supervision: Patient able to self-feed Oral care recommendations: Oral care BID (2x/day) Treatment Plan Treatment Plan Treatment recommendations: No treatment recommended at this time Follow-up recommendations: No SLP follow up Recommendations Recommendations for follow up therapy are one  component of a multi-disciplinary discharge planning process, led by the attending physician.  Recommendations may be updated based on patient status, additional functional criteria and insurance authorization. Assessment: Orofacial Exam: Orofacial Exam Oral Cavity: Oral Hygiene: WFL Anatomy: Anatomy: Suspected cervical osteophytes Boluses Administered: Boluses Administered Boluses Administered: Thin liquids (Level 0); Mildly thick liquids (Level 2, nectar thick); Puree; Solid  Oral Impairment Domain: Oral Impairment Domain Lip Closure: No labial escape Tongue control during bolus hold: Not tested Bolus preparation/mastication: Timely and efficient chewing and mashing Bolus transport/lingual motion: Brisk tongue motion Oral residue: Complete oral clearance Location of oral residue : N/A  Pharyngeal Impairment Domain: Pharyngeal Impairment Domain Soft palate elevation: No bolus between soft palate (SP)/pharyngeal wall (PW) Laryngeal elevation: Complete superior movement of thyroid  cartilage with complete approximation of arytenoids to epiglottic petiole Anterior hyoid excursion: Complete anterior movement Epiglottic movement: Complete inversion Laryngeal vestibule closure: Complete, no air/contrast in laryngeal vestibule Pharyngeal stripping wave : Present - complete Pharyngeal contraction (  A/P view only): N/A Pharyngoesophageal segment opening: Complete distension and complete duration, no obstruction of flow Tongue base retraction: No contrast between tongue base and posterior pharyngeal wall (PPW) Pharyngeal residue: Complete pharyngeal clearance  Esophageal Impairment Domain: Esophageal Impairment Domain Esophageal clearance upright position: Complete clearance, esophageal coating Pill: No data recorded Penetration/Aspiration Scale Score: Penetration/Aspiration Scale Score 1.  Material does not enter airway: Thin liquids (Level 0); Mildly thick liquids (Level 2, nectar thick); Puree; Solid Compensatory  Strategies: Compensatory Strategies Compensatory strategies: No   General Information: Caregiver present: Yes (marilyn)  Diet Prior to this Study: Regular; Thin liquids (Level 0)   No data recorded  Respiratory Status: WFL   Supplemental O2: None (Room air)   History of Recent Intubation: No  Behavior/Cognition: Alert; Cooperative; Pleasant mood Self-Feeding Abilities: Able to self-feed Baseline vocal quality/speech: Normal Volitional Cough: Able to elicit Volitional Swallow: Able to elicit Exam Limitations: No limitations Goal Planning: No data recorded No data recorded No data recorded No data recorded Consulted and agree with results and recommendations: Family member/caregiver Pain: Pain Assessment Pain Assessment: No/denies pain End of Session: Start Time:SLP Start Time (ACUTE ONLY): 1252 Stop Time: SLP Stop Time (ACUTE ONLY): 1320 Time Calculation:SLP Time Calculation (min) (ACUTE ONLY): 28 min Charges: SLP Evaluations $ SLP Speech Visit: 1 Visit SLP Evaluations $Outpatient MBS Swallow: 1 Procedure SLP visit diagnosis: SLP Visit Diagnosis: Dysphagia, unspecified (R13.10) Past Medical History: Past Medical History: Diagnosis Date  Arthritis   Disturbances of sensation of smell and taste   Essential hypertension   a.  02/2007 Echo: Nl EF, triv AI.  GERD (gastroesophageal reflux disease)   Lichen sclerosus   Lipoma of face   Memory loss   Mixed hyperlipidemia   Osteopenia   Other persistent mental disorders due to conditions classified elsewhere   Tubular adenoma of colon 2017 Past Surgical History: Past Surgical History: Procedure Laterality Date  CATARACT EXTRACTION, BILATERAL    OOPHORECTOMY  1988  DIAG LAP W BSO  TONSILECTOMY, ADENOIDECTOMY, BILATERAL MYRINGOTOMY AND TUBES    TOTAL HIP ARTHROPLASTY Left 09/01/2015  Procedure: LEFT TOTAL HIP ARTHROPLASTY ANTERIOR APPROACH;  Surgeon: Donnice Car, MD;  Location: [facility];  Service: Orthopedics;  Laterality: Left; Vona Palma Laurice 04/05/2024, 3:02 PM CLINICAL  DATA:  Dysphagia. Cough/GE reflux disease/other secondary diagnosis EXAM: MODIFIED BARIUM SWALLOW TECHNIQUE: Different consistencies of barium were administered orally to the patient by the Speech Pathologist. Imaging of the pharynx was performed in the lateral projection. The radiologist was present in the fluoroscopy room for this study, providing personal supervision. FLUOROSCOPY: Radiation Exposure Index (as provided by the fluoroscopic device): 9.5 mGy Kerma COMPARISON:  Esophagram of 03/06/2024. FINDINGS: Vestibular  Penetration:  None seen. Aspiration:  None seen. Other:  None. IMPRESSION: No penetration or aspiration seen. Please refer to the Speech Pathologists report for complete details and recommendations. Electronically Signed   By: Rockey Kilts M.D.   On: 04/05/2024 14:19      The results of significant diagnostics from this hospitalization (including imaging, microbiology, ancillary and laboratory) are listed below for reference.     Microbiology: Recent Results (from the past 240 hours)  WET PREP FOR TRICH, YEAST, CLUE     Status: None   Collection Time: 04/16/24 12:29 PM   Specimen: Genital  Result Value Ref Range Status   Source: VAGINA  Final   RESULT   Final    Comment: EPITHELIAL CELLS-PRESENT CLUE CELLS-NONE SEEN YEAST-NONE SEEN TRICHOMONAS-NONE SEEN WBC-FEW BACTERIA-FEW EPITH. CELLS <6 PER HPF  Respiratory (~20 pathogens) panel by PCR     Status: None   Collection Time: 04/21/24  3:19 PM   Specimen: Nasopharyngeal Swab; Respiratory  Result Value Ref Range Status   Adenovirus NOT DETECTED NOT DETECTED Final   Coronavirus 229E NOT DETECTED NOT DETECTED Final    Comment: (NOTE) The Coronavirus on the Respiratory Panel, DOES NOT test for the novel  Coronavirus (2019 nCoV)    Coronavirus HKU1 NOT DETECTED NOT DETECTED Final   Coronavirus NL63 NOT DETECTED NOT DETECTED Final   Coronavirus OC43 NOT DETECTED NOT DETECTED Final   Metapneumovirus NOT DETECTED NOT  DETECTED Final   Rhinovirus / Enterovirus NOT DETECTED NOT DETECTED Final   Influenza A NOT DETECTED NOT DETECTED Final   Influenza B NOT DETECTED NOT DETECTED Final   Parainfluenza Virus 1 NOT DETECTED NOT DETECTED Final   Parainfluenza Virus 2 NOT DETECTED NOT DETECTED Final   Parainfluenza Virus 3 NOT DETECTED NOT DETECTED Final   Parainfluenza Virus 4 NOT DETECTED NOT DETECTED Final   Respiratory Syncytial Virus NOT DETECTED NOT DETECTED Final   Bordetella pertussis NOT DETECTED NOT DETECTED Final   Bordetella Parapertussis NOT DETECTED NOT DETECTED Final   Chlamydophila pneumoniae NOT DETECTED NOT DETECTED Final   Mycoplasma pneumoniae NOT DETECTED NOT DETECTED Final    Comment: Performed at [facility], [address] [place], [place] [address]  SARS Coronavirus 2 by RT PCR (hospital order, performed in [facility] hospital lab) *cepheid single result test* Anterior Nasal Swab     Status: None   Collection Time: 04/21/24  3:20 PM   Specimen: Anterior Nasal Swab  Result Value Ref Range Status   SARS Coronavirus 2 by RT PCR NEGATIVE NEGATIVE Final    Comment: (NOTE) SARS-CoV-2 target nucleic acids are NOT DETECTED.  The SARS-CoV-2 RNA is generally detectable in upper and lower respiratory specimens during the acute phase of infection. The lowest concentration of SARS-CoV-2 viral copies this assay can detect is 250 copies / mL. A negative result does not preclude SARS-CoV-2 infection and should not be used as the sole basis for treatment or other patient management decisions.  A negative result may occur with improper specimen collection / handling, submission of specimen other than nasopharyngeal swab, presence of viral mutation(s) within the areas targeted by this assay, and inadequate number of viral copies (<250 copies / mL). A negative result must be combined with clinical observations, patient history, and epidemiological information.  Fact Sheet for Patients:    roadlaptop.co.za  Fact Sheet for Healthcare Providers: http://kim-miller.com/  This test is not yet approved or  cleared by the [place]  FDA and has been authorized for detection and/or diagnosis of SARS-CoV-2 by FDA under an Emergency Use Authorization (EUA).  This EUA will remain in effect (meaning this test can be used) for the duration of the COVID-19 declaration under Section 564(b)(1) of the Act, 21 U.S.C. section 360bbb-3(b)(1), unless the authorization is terminated or revoked sooner.  Performed at [facility], [address], [place], [place] [address]      Labs:  CBC: Recent Labs  Lab 04/18/24 1412 04/19/24 0334 04/20/24 0323 04/21/24 0329 04/22/24 0332  WBC 24.7* 13.1* 16.4* 13.0* 11.7*  HGB 13.6 12.1 11.0* 10.2* 10.8*  HCT 41.6 36.8 33.1* 31.9* 33.6*  MCV 100.2* 99.2 99.4 101.6* 100.9*  PLT 307 280 247 213 237   BMP &GFR Recent Labs  Lab 04/18/24 1412 04/19/24 0334 04/20/24 0323 04/22/24 0332  NA 139 141  138 144  K 3.6 3.9 4.1 4.4  CL 103 105 101 106  CO2 27 27 26 31   GLUCOSE 110* 100* 133* 98  BUN 10 16 25* 24*  CREATININE 0.44 0.68 0.89 0.63  CALCIUM  9.3 9.3 9.5 9.2  MG  --   --  2.3 2.5*  PHOS  --   --  3.7 3.8   Estimated Creatinine Clearance: 33.8 mL/min (by C-G formula based on SCr of 0.63 mg/dL). Liver & Pancreas: Recent Labs  Lab 04/19/24 0334 04/20/24 0323 04/22/24 0332  AST 20  --   --   ALT 11  --   --   ALKPHOS 59  --   --   BILITOT 0.5  --   --   PROT 5.9*  --   --   ALBUMIN 3.7 3.5 3.6   No results for input(s): LIPASE, AMYLASE in the last 168 hours. No results for input(s): AMMONIA in the last 168 hours. Diabetic: No results for input(s): HGBA1C in the last 72 hours. No results for input(s): GLUCAP in the last 168 hours. Cardiac Enzymes: No results for input(s): CKTOTAL, CKMB, CKMBINDEX, TROPONINI in the last 168 hours. No results  for input(s): PROBNP in the last 8760 hours. Coagulation Profile: Recent Labs  Lab 04/18/24 1412  INR 1.0   Thyroid  Function Tests: No results for input(s): TSH, T4TOTAL, FREET4, T3FREE, THYROIDAB in the last 72 hours. Lipid Profile: No results for input(s): CHOL, HDL, LDLCALC, TRIG, CHOLHDL, LDLDIRECT in the last 72 hours. Anemia Panel: No results for input(s): VITAMINB12, FOLATE, FERRITIN, TIBC, IRON, RETICCTPCT in the last 72 hours. Urine analysis:    Component Value Date/Time   COLORURINE STRAW (A) 04/18/2024 1548   APPEARANCEUR HAZY (A) 04/18/2024 1548   APPEARANCEUR Cloudy (A) 02/05/2015 1116   LABSPEC 1.005 04/18/2024 1548   PHURINE 8.0 04/18/2024 1548   GLUCOSEU NEGATIVE 04/18/2024 1548   HGBUR NEGATIVE 04/18/2024 1548   BILIRUBINUR NEGATIVE 04/18/2024 1548   BILIRUBINUR Negative 02/05/2015 1116   KETONESUR NEGATIVE 04/18/2024 1548   PROTEINUR NEGATIVE 04/18/2024 1548   NITRITE NEGATIVE 04/18/2024 1548   LEUKOCYTESUR NEGATIVE 04/18/2024 1548   Sepsis Labs: Invalid input(s): PROCALCITONIN, LACTICIDVEN   SIGNED:  Mathhew Buysse T Susana Gripp, MD  [facility] 04/22/2024, 3:33 PM   "

## 2024-04-22 NOTE — Telephone Encounter (Signed)
 Inbound call from patient care giver stating that the patient just got out of the hospital today due to her having a fractured hip. Patient care giver would like a call back at [phone] in order to reschedule patient procedure with Dr. Suzann. Please advise.

## 2024-04-22 NOTE — Care Management Important Message (Signed)
 Important Message  Patient Details IM Letter given. Name: Margaret [name] MRN: [id number] Date of Birth: 01-22-1935   Important Message Given:  Yes - Medicare IM     [name] 04/22/2024, 9:14 AM

## 2024-04-23 ENCOUNTER — Other Ambulatory Visit (HOSPITAL_COMMUNITY): Payer: Self-pay

## 2024-04-23 MED ORDER — OXYCODONE HCL 5 MG PO TABS: 5.0000 mg | ORAL_TABLET | Freq: Four times a day (QID) | ORAL | 0 refills | Status: AC | PRN

## 2024-04-23 MED ORDER — OXYCODONE HCL 5 MG PO TABS: 5.0000 mg | ORAL_TABLET | Freq: Four times a day (QID) | ORAL | 0 refills | Status: DC | PRN

## 2024-04-23 NOTE — Telephone Encounter (Signed)
 Pt caregiver was contacted.  Please see alternate phone note

## 2024-04-23 NOTE — Telephone Encounter (Signed)
 Spoke with pt caregiver. Caregiver stated that the pt fell and broke her hip and needs to reschedule her procedure.  Scheduling was notified and procedure was canceled.  Caregiver was notified that we will call her back soon and reschedule.  Routed as FYI

## 2024-04-30 ENCOUNTER — Ambulatory Visit (HOSPITAL_COMMUNITY): Admission: RE | Admit: 2024-04-30 | Source: Home / Self Care | Admitting: Pediatrics

## 2024-04-30 HISTORY — DX: Scoliosis, unspecified: M41.9

## 2024-04-30 NOTE — Telephone Encounter (Signed)
 Follow up was made in regard to scheduling the procedure. Margaret [name] the caregiver stated that they requested to hold off until her hip heals and then they will call our office to reschedule.

## 2024-05-15 ENCOUNTER — Ambulatory Visit: Admitting: Obstetrics and Gynecology

## 2024-05-15 ENCOUNTER — Encounter: Payer: Self-pay | Admitting: Obstetrics and Gynecology

## 2024-05-15 VITALS — BP 112/74 | Ht <= 58 in | Wt 113.0 lb

## 2024-05-15 DIAGNOSIS — Z01419 Encounter for gynecological examination (general) (routine) without abnormal findings: Secondary | ICD-10-CM

## 2024-05-15 DIAGNOSIS — Z5181 Encounter for therapeutic drug level monitoring: Secondary | ICD-10-CM

## 2024-05-15 DIAGNOSIS — L900 Lichen sclerosus et atrophicus: Secondary | ICD-10-CM

## 2024-05-15 MED ORDER — BETAMETHASONE VALERATE 0.1 % EX OINT: 1.0000 | TOPICAL_OINTMENT | Freq: Two times a day (BID) | CUTANEOUS | 1 refills | Status: AC

## 2024-05-15 NOTE — Progress Notes (Unsigned)
 "  89 y.o. [id number] Widowed Caucasian female here for a breast and pelvic exam. Needs new rx of Valisone .  Caregiver is with her today.    The patient is also followed for  lichen sclerosus.  Recent fright femur fracture.  Patient is ambulatory and doing well.   Her PCP office is following her osteoporosis and recently attempted a bone density.   PCP: Loreli Elsie JONETTA Mickey., MD   No LMP recorded. Patient is postmenopausal.           Sexually active: No.  The current method of family planning is post menopausal status.    Menopausal hormone therapy:  n/a Exercising: No.  Just had hip surgery.  Smoker:  no  OB History     Gravida  4   Para  3   Term      Preterm      AB  1   Living  3      SAB      IAB      Ectopic      Multiple      Live Births              HEALTH MAINTENANCE: Last 2 paps: 05/17/10 neg History of abnormal Pap or positive HPV:  no Mammogram:  2023 per pt, 04/11/11 BIRADS Cat 1 neg  Colonoscopy:  05/15/15 Bone Density:  05/14/24 per care giver (results not back) 01/16/15  Result  osteoporosis    Immunization History  Administered Date(s) Administered   Influenza,inj,Quad PF,6+ Mos 02/05/2015   Influenza-Unspecified 04/25/2013   Moderna Covid-19 Fall Seasonal Vaccine 74yrs & older 02/10/2022, 02/14/2023   Moderna Covid-19 Vaccine  Bivalent Booster 66yrs & up 02/04/2021   Moderna Sars-Covid-2 Vaccination 05/09/2019, 08/21/2019, 03/11/2020   Pneumococcal Conjugate-13 01/07/2015      reports that she quit smoking about 21 years ago. Her smoking use included cigarettes. She has never used smokeless tobacco. She reports that she does not currently use alcohol  after a past usage of about 21.0 standard drinks of alcohol  per week. She reports that she does not use drugs.  Past Medical History:  Diagnosis Date   Arthritis    Disturbances of sensation of smell and taste    Essential hypertension    a.  02/2007 Echo: Nl EF, triv AI.   GERD  (gastroesophageal reflux disease)    Lichen sclerosus    Lipoma of face    Memory loss    Mixed hyperlipidemia    Osteopenia    Other persistent mental disorders due to conditions classified elsewhere    Scoliosis    Tubular adenoma of colon 2017    Past Surgical History:  Procedure Laterality Date   CATARACT EXTRACTION, BILATERAL     OOPHORECTOMY  1988   DIAG LAP W BSO   TONSILECTOMY, ADENOIDECTOMY, BILATERAL MYRINGOTOMY AND TUBES     TOTAL HIP ARTHROPLASTY Left 09/01/2015   Procedure: LEFT TOTAL HIP ARTHROPLASTY ANTERIOR APPROACH;  Surgeon: Donnice Car, MD;  Location: [facility];  Service: Orthopedics;  Laterality: Left;    Current Outpatient Medications  Medication Sig Dispense Refill   acetaminophen  (TYLENOL  8 HOUR) 650 MG CR tablet Take 1,300 mg by mouth every 8 (eight) hours as needed for pain.      amLODipine  (NORVASC ) 5 MG tablet Take 1 tablet by mouth daily.     aspirin  EC 81 MG tablet Take 1 tablet (81 mg total) by mouth 2 (two) times daily. Swallow whole. 84 tablet 0   betamethasone   valerate ointment (VALISONE ) 0.1 % Apply 1 Application topically 2 (two) times daily. Place in a thin layer to skin twice daily for 2 weeks and then use twice a week at bedtime. (Patient taking differently: Apply 1 Application topically 2 (two) times daily. Apply to vaginal area) 45 g 0   buprenorphine  (BUTRANS ) 20 MCG/HR PTWK Place 1 patch onto the skin once a week.     celecoxib  (CELEBREX ) 100 MG capsule Take 100 mg by mouth 2 (two) times daily.     cetirizine  (ZYRTEC ) 10 MG tablet Take 10 mg by mouth daily as needed.     Cholecalciferol  (VITAMIN D3) 50 MCG (2000 UT) TABS Take 2,000 Units by mouth daily.     cycloSPORINE  (RESTASIS ) 0.05 % ophthalmic emulsion Place 1 drop into both eyes 2 (two) times daily.     diclofenac sodium (VOLTAREN) 1 % GEL Apply 1 application topically 3 (three) times daily as needed (for back pain).     docusate sodium  (COLACE) 50 MG capsule Take 50 mg by mouth daily.      escitalopram  (LEXAPRO ) 10 MG tablet Take 10 mg by mouth daily.     escitalopram  (LEXAPRO ) 5 MG tablet TAKE 1 TABLET BY MOUTH DAILY*CALL OFFICE TO MAKE APPOINTMENT* (Patient taking differently: Take 10 mg by mouth daily.) 30 tablet 0   fluconazole (DIFLUCAN) 200 MG tablet Take 1 tablet by mouth daily as needed.     fluticasone  (FLONASE ) 50 MCG/ACT nasal spray Place 1 spray into both nostrils in the morning and at bedtime.     gabapentin  (NEURONTIN ) 100 MG capsule Take 200-300 mg by mouth See admin instructions. Take 300 mg morning, 200 mg at 3 pm & 200 mg at bedtime     galantamine  (RAZADYNE  ER) 24 MG 24 hr capsule TAKE 1 CAPSULE BY MOUTH EVERY DAY (Patient taking differently: Take 24 mg by mouth daily with breakfast.) 90 capsule 1   ipratropium (ATROVENT ) 0.03 % nasal spray Place 2 sprays into both nostrils 2 (two) times daily.     Multiple Vitamin (MULTIVITAMIN WITH MINERALS) TABS tablet Take 1 tablet by mouth daily.     MYRBETRIQ 50 MG TB24 tablet 1 tablet Orally Once a day; Duration: 30 days     NAMENDA  XR 28 MG CP24 24 hr capsule TAKE 1 CAPSULE(28 MG) BY MOUTH DAILY (Patient taking differently: Take 28 mg by mouth daily.) 30 capsule 5   olmesartan (BENICAR) 20 MG tablet Take 20 mg by mouth daily.     oxyCODONE  (OXY IR/ROXICODONE ) 5 MG immediate release tablet Take 1 tablet (5 mg total) by mouth every 6 (six) hours as needed (for pain score of 1-4). 30 tablet 0   polyethylene glycol (MIRALAX  / GLYCOLAX ) 17 g packet Take 17 g by mouth daily.     Probiotic Product (PROBIOTIC PO) Take 1 capsule by mouth daily.     nystatin  (MYCOSTATIN /NYSTOP ) powder Apply 1 Application topically 3 (three) times daily. Apply to affected area for up to 7 days (Patient not taking: Reported on 05/15/2024) 30 g 2   nystatin -triamcinolone  ointment (MYCOLOG) Apply 1 application topically 2 (two) times daily. Apply BID for up to 7 days. (Patient not taking: Reported on 05/15/2024) 60 g 0   No current facility-administered  medications for this visit.    ALLERGIES: Amoxicillin -pot clavulanate, Sulfa antibiotics, Sulfasalazine, Denosumab , and Lidocaine   Family History  Problem Relation Age of Onset   Cancer Mother        OVARIAN   Diabetes Father  Hypertension Father     Review of Systems  All other systems reviewed and are negative.   PHYSICAL EXAM:  BP 112/74 (BP Location: Left Arm, Patient Position: Sitting)   Ht 4' 9 (1.448 m)   Wt 113 lb (51.3 kg)   BMI 24.45 kg/m     General appearance: alert, cooperative and appears stated age Breasts: normal appearance, no masses or tenderness, No nipple retraction or dimpling, No nipple discharge or bleeding, No axillary adenopathy Abdomen: soft, non-tender; no masses, no organomegaly  Pelvic: External genitalia:  pale white skin color change of the superior labia minora.  Labia are fused with the clitoris.  Urethra is not obstructed.  No raise lesions of the vulva.                No abnormal inguinal nodes palpated.              Urethra:  normal appearing urethra with no masses, tenderness or lesions     Limited flexion of the right thigh preventing a speculum and bimanual exam.   Chaperone was present for exam:  Kari HERO, CMA  ASSESSMENT: Encounter for breast and pelvic exam.  Lichen sclerosus.  Medication monitoring.  Osteoporosis.  Recent fracture.   PLAN: Mammograms and paps are now deferred due to current age.  Medication refills:  Valisone  ointment, 0.1%, apply twice a week at bedtime for maintenance dosing.  Disp:  5 grams, RF 1.  Follow up yearly and prn.     Additional counseling given.  yes. 10 min  total time was spent for this patient encounter, including preparation, face-to-face counseling with the patient, coordination of care, and documentation of the encounter in addition to doing the breast and pelvic exam.        "

## 2025-05-20 ENCOUNTER — Encounter: Admitting: Obstetrics and Gynecology
